# Patient Record
Sex: Male | Born: 1950 | Race: Black or African American | Hispanic: No | Marital: Single | Smoking: Current every day smoker
Health system: Southern US, Community
[De-identification: ages and names within clinical notes are randomized; demographics above are authoritative.]

## PROBLEM LIST (undated history)

## (undated) DIAGNOSIS — J449 Chronic obstructive pulmonary disease, unspecified: Secondary | ICD-10-CM

## (undated) DIAGNOSIS — F329 Major depressive disorder, single episode, unspecified: Secondary | ICD-10-CM

## (undated) DIAGNOSIS — F1490 Cocaine use, unspecified, uncomplicated: Secondary | ICD-10-CM

## (undated) DIAGNOSIS — M069 Rheumatoid arthritis, unspecified: Secondary | ICD-10-CM

## (undated) DIAGNOSIS — E119 Type 2 diabetes mellitus without complications: Secondary | ICD-10-CM

## (undated) DIAGNOSIS — F1910 Other psychoactive substance abuse, uncomplicated: Secondary | ICD-10-CM

## (undated) DIAGNOSIS — I10 Essential (primary) hypertension: Secondary | ICD-10-CM

## (undated) DIAGNOSIS — Z720 Tobacco use: Secondary | ICD-10-CM

## (undated) DIAGNOSIS — M359 Systemic involvement of connective tissue, unspecified: Secondary | ICD-10-CM

## (undated) DIAGNOSIS — M0520 Rheumatoid vasculitis with rheumatoid arthritis of unspecified site: Secondary | ICD-10-CM

## (undated) DIAGNOSIS — F1290 Cannabis use, unspecified, uncomplicated: Secondary | ICD-10-CM

## (undated) DIAGNOSIS — F1010 Alcohol abuse, uncomplicated: Secondary | ICD-10-CM

## (undated) DIAGNOSIS — I509 Heart failure, unspecified: Secondary | ICD-10-CM

## (undated) DIAGNOSIS — F32A Depression, unspecified: Secondary | ICD-10-CM

## (undated) HISTORY — DX: Other psychoactive substance abuse, uncomplicated: F19.10

## (undated) HISTORY — DX: Rheumatoid vasculitis with rheumatoid arthritis of unspecified site: M05.20

## (undated) HISTORY — PX: BACK SURGERY: SHX140

## (undated) HISTORY — DX: Alcohol abuse, uncomplicated: F10.10

## (undated) HISTORY — DX: Major depressive disorder, single episode, unspecified: F32.9

## (undated) HISTORY — DX: Chronic obstructive pulmonary disease, unspecified: J44.9

## (undated) HISTORY — DX: Depression, unspecified: F32.A

## (undated) HISTORY — DX: Rheumatoid arthritis, unspecified: M06.9

## (undated) HISTORY — DX: Cannabis use, unspecified, uncomplicated: F12.90

## (undated) HISTORY — DX: Cocaine use, unspecified, uncomplicated: F14.90

## (undated) HISTORY — DX: Essential (primary) hypertension: I10

## (undated) HISTORY — DX: Heart failure, unspecified: I50.9

## (undated) HISTORY — DX: Tobacco use: Z72.0

---

## 1985-03-07 HISTORY — PX: OTHER SURGICAL HISTORY: SHX169

## 1987-03-08 DIAGNOSIS — M0520 Rheumatoid vasculitis with rheumatoid arthritis of unspecified site: Secondary | ICD-10-CM

## 1987-03-08 HISTORY — DX: Rheumatoid vasculitis with rheumatoid arthritis of unspecified site: M05.20

## 2009-03-05 DIAGNOSIS — F1420 Cocaine dependence, uncomplicated: Secondary | ICD-10-CM | POA: Insufficient documentation

## 2009-03-05 DIAGNOSIS — F1911 Other psychoactive substance abuse, in remission: Secondary | ICD-10-CM | POA: Insufficient documentation

## 2009-03-05 DIAGNOSIS — R509 Fever, unspecified: Secondary | ICD-10-CM | POA: Insufficient documentation

## 2009-03-05 DIAGNOSIS — R062 Wheezing: Secondary | ICD-10-CM | POA: Insufficient documentation

## 2009-03-05 DIAGNOSIS — F1010 Alcohol abuse, uncomplicated: Secondary | ICD-10-CM | POA: Diagnosis present

## 2009-03-05 DIAGNOSIS — R0602 Shortness of breath: Secondary | ICD-10-CM | POA: Insufficient documentation

## 2014-06-22 DIAGNOSIS — J9602 Acute respiratory failure with hypercapnia: Secondary | ICD-10-CM | POA: Insufficient documentation

## 2014-06-22 DIAGNOSIS — Z720 Tobacco use: Secondary | ICD-10-CM | POA: Insufficient documentation

## 2014-06-22 DIAGNOSIS — I509 Heart failure, unspecified: Secondary | ICD-10-CM | POA: Insufficient documentation

## 2014-06-22 DIAGNOSIS — M069 Rheumatoid arthritis, unspecified: Secondary | ICD-10-CM | POA: Insufficient documentation

## 2014-06-22 DIAGNOSIS — J449 Chronic obstructive pulmonary disease, unspecified: Secondary | ICD-10-CM | POA: Insufficient documentation

## 2014-06-22 DIAGNOSIS — F17200 Nicotine dependence, unspecified, uncomplicated: Secondary | ICD-10-CM | POA: Insufficient documentation

## 2014-06-22 DIAGNOSIS — I10 Essential (primary) hypertension: Secondary | ICD-10-CM | POA: Insufficient documentation

## 2014-06-22 DIAGNOSIS — J441 Chronic obstructive pulmonary disease with (acute) exacerbation: Secondary | ICD-10-CM | POA: Insufficient documentation

## 2014-06-22 DIAGNOSIS — I5030 Unspecified diastolic (congestive) heart failure: Secondary | ICD-10-CM | POA: Insufficient documentation

## 2014-11-18 ENCOUNTER — Ambulatory Visit: Payer: Self-pay | Admitting: Family Medicine

## 2014-11-26 ENCOUNTER — Encounter: Payer: Self-pay | Admitting: Family Medicine

## 2014-11-26 ENCOUNTER — Ambulatory Visit (INDEPENDENT_AMBULATORY_CARE_PROVIDER_SITE_OTHER): Payer: Medicare PPO | Admitting: Family Medicine

## 2014-11-26 VITALS — BP 146/76 | HR 80 | Ht 70.0 in | Wt 220.6 lb

## 2014-11-26 DIAGNOSIS — M069 Rheumatoid arthritis, unspecified: Secondary | ICD-10-CM | POA: Diagnosis not present

## 2014-11-26 DIAGNOSIS — E669 Obesity, unspecified: Secondary | ICD-10-CM

## 2014-11-26 DIAGNOSIS — J449 Chronic obstructive pulmonary disease, unspecified: Secondary | ICD-10-CM

## 2014-11-26 DIAGNOSIS — I5032 Chronic diastolic (congestive) heart failure: Secondary | ICD-10-CM

## 2014-11-26 DIAGNOSIS — Z23 Encounter for immunization: Secondary | ICD-10-CM

## 2014-11-26 DIAGNOSIS — I10 Essential (primary) hypertension: Secondary | ICD-10-CM

## 2014-11-26 MED ORDER — ALBUTEROL SULFATE (2.5 MG/3ML) 0.083% IN NEBU: 2.5000 mg | INHALATION_SOLUTION | RESPIRATORY_TRACT | Status: DC | PRN

## 2014-11-26 MED ORDER — HYDROCHLOROTHIAZIDE 25 MG PO TABS: 25.0000 mg | ORAL_TABLET | Freq: Every day | ORAL | Status: DC

## 2014-11-26 NOTE — Progress Notes (Signed)
Date:  11/26/2014   Name:  Daniel Frye   DOB:  14-Jan-1951   MRN:  [id number]  PCP:  [name], MD    Chief Complaint: Establish Care and Edema   History of Present Illness:  This is a 64 y.o. male with CHF/COPD/HTN/RA moved here from [place] 6 months ago, has been hospitalized twice since at  [facility], most recently in May for COPD exacerbation requiring Bipap and steroids. Echo done previous admission showed diastolic dysfunction but not discharged on diuretic, pt states lasix ineffective. Was scheduled to f/u with [facility] for primary care but did not keep appt. Currently c/o increasing fluid retention over past three, feels now affecting breathing. Has run out of nebulizer solution but using albuterol MDI daily. Taking lisinopril but not amlodipine. Has quit smoking, feels breathing worse since finished steroids. Hx pericardial effusion requiring surgery.  Review of Systems:  Review of Systems  Constitutional: Negative for fever and chills.  Cardiovascular: Negative for chest pain.  Gastrointestinal: Negative for abdominal pain.  Genitourinary: Negative for difficulty urinating.  Neurological: Negative for syncope and light-headedness.    Patient Active Problem List   Diagnosis Date Noted  . Obesity (BMI 30.0-34.9) 11/26/2014  . Acute respiratory failure with hypercapnia 06/22/2014  . CCF (congestive cardiac failure) 06/22/2014  . Acute exacerbation of chronic obstructive airways disease 06/22/2014  . Essential (primary) hypertension 06/22/2014  . Arthritis or polyarthritis, rheumatoid 06/22/2014  . Compulsive tobacco user syndrome 06/22/2014  . Mixed, or nondependent drug abuse 03/05/2009    Prior to Admission medications   Medication Sig Start Date End Date Taking? Authorizing Provider  albuterol (PROVENTIL) (2.5 MG/3ML) 0.083% nebulizer solution Take 3 mLs (2.5 mg total) by nebulization every 4 (four) hours as needed for wheezing or shortness of breath. 11/26/14 11/26/15 Yes  [name], MD  hydrochlorothiazide (HYDRODIURIL) 25 MG tablet Take 1 tablet (25 mg total) by mouth daily. 11/26/14   [name], MD  lisinopril (PRINIVIL,ZESTRIL) 40 MG tablet Take 1 tablet by mouth daily at 2 PM daily at 2 PM. 08/20/14   Historical Provider, MD  PROAIR HFA 108 (90 BASE) MCG/ACT inhaler  10/27/14   Historical Provider, MD    No Known Allergies  No past surgical history on file.  Social History  Substance Use Topics  . Smoking status: Former Smoker    Quit date: 05/06/2014  . Smokeless tobacco: Not on file  . Alcohol Use: 0.0 oz/week    0 Standard drinks or equivalent per week    No family history on file.  Medication list has been reviewed and updated.  Physical Examination: BP 146/76 mmHg  Pulse 80  Ht 5\' 10"  (1.778 m)  Wt 220 lb 9.6 oz (100.064 kg)  BMI 31.65 kg/m2  SpO2 97%  Physical Exam  Constitutional: He is oriented to person, place, and time. He appears well-developed and well-nourished.  Cardiovascular: Normal rate, regular rhythm and normal heart sounds.  Exam reveals no gallop.   No murmur heard. Pulmonary/Chest: Effort normal and breath sounds normal. He has no wheezes. He has no rales.  Abdominal: Soft. He exhibits no distension and no mass. There is no tenderness.  Musculoskeletal:  2+ tense edema BLE to thighs, no skin breakdown  Neurological: He is alert and oriented to person, place, and time. Coordination normal.  Skin: Skin is warm and dry.  Psychiatric: He has a normal mood and affect. His behavior is normal.    Assessment and Plan:  1. Chronic diastolic congestive heart failure Significant  progressive BLE edema, begin HCTZ with lisinopril, consider spironolactone - Comprehensive metabolic panel - CBC - TSH  2. Essential (primary) hypertension Marginal control, should improve with HCTZ  3. Chronic obstructive pulmonary disease, unspecified COPD, unspecified chronic bronchitis type Refill albuterol nebs, needs pulm  f/u  4. Obesity (BMI 30.0-34.9) - HgB A1c  5. Arthritis or polyarthritis, rheumatoid By hx, no active therapy  6. Needs flu shot - Flu Vaccine QUAD 36+ mos PF IM (Fluarix & Fluzone Quad PF)  Return in about 1 week (around 12/03/2014). Discuss receiving his care within single system [facility]) for improved continuity.  Satira Anis. [facility]  11/26/2014

## 2014-11-27 LAB — COMPREHENSIVE METABOLIC PANEL
ALT: 22 IU/L (ref 0–44)
AST: 20 IU/L (ref 0–40)
Albumin/Globulin Ratio: 1.5 (ref 1.1–2.5)
Albumin: 4 g/dL (ref 3.6–4.8)
Alkaline Phosphatase: 94 IU/L (ref 39–117)
BUN/Creatinine Ratio: 17 (ref 10–22)
BUN: 17 mg/dL (ref 8–27)
Bilirubin Total: 0.2 mg/dL (ref 0.0–1.2)
CO2: 25 mmol/L (ref 18–29)
Calcium: 9 mg/dL (ref 8.6–10.2)
Chloride: 101 mmol/L (ref 97–108)
Creatinine, Ser: 0.98 mg/dL (ref 0.76–1.27)
GFR calc Af Amer: 92 mL/min/{1.73_m2} (ref >59)
GFR calc non Af Amer: 80 mL/min/{1.73_m2} (ref >59)
Globulin, Total: 2.7 g/dL (ref 1.5–4.5)
Glucose: 91 mg/dL (ref 65–99)
Potassium: 3.9 mmol/L (ref 3.5–5.2)
Sodium: 144 mmol/L (ref 134–144)
Total Protein: 6.7 g/dL (ref 6.0–8.5)

## 2014-11-27 LAB — CBC
Hematocrit: 43.7 % (ref 37.5–51.0)
Hemoglobin: 14.5 g/dL (ref 12.6–17.7)
MCH: 30.4 pg (ref 26.6–33.0)
MCHC: 33.2 g/dL (ref 31.5–35.7)
MCV: 92 fL (ref 79–97)
Platelets: 183 10*3/uL (ref 150–379)
RBC: 4.77 x10E6/uL (ref 4.14–5.80)
RDW: 14.4 % (ref 12.3–15.4)
WBC: 7 10*3/uL (ref 3.4–10.8)

## 2014-11-27 LAB — HEMOGLOBIN A1C
Est. average glucose Bld gHb Est-mCnc: 137 mg/dL
Hgb A1c MFr Bld: 6.4 % — ABNORMAL HIGH (ref 4.8–5.6)

## 2014-11-27 LAB — TSH: TSH: 0.633 u[IU]/mL (ref 0.450–4.500)

## 2014-12-03 ENCOUNTER — Ambulatory Visit (INDEPENDENT_AMBULATORY_CARE_PROVIDER_SITE_OTHER): Payer: Medicare PPO | Admitting: Family Medicine

## 2014-12-03 ENCOUNTER — Encounter: Payer: Self-pay | Admitting: Family Medicine

## 2014-12-03 VITALS — BP 132/60 | HR 104 | Ht 70.0 in | Wt 213.0 lb

## 2014-12-03 DIAGNOSIS — I10 Essential (primary) hypertension: Secondary | ICD-10-CM | POA: Diagnosis not present

## 2014-12-03 DIAGNOSIS — E669 Obesity, unspecified: Secondary | ICD-10-CM

## 2014-12-03 DIAGNOSIS — J449 Chronic obstructive pulmonary disease, unspecified: Secondary | ICD-10-CM | POA: Diagnosis not present

## 2014-12-03 DIAGNOSIS — R7309 Other abnormal glucose: Secondary | ICD-10-CM

## 2014-12-03 DIAGNOSIS — I5032 Chronic diastolic (congestive) heart failure: Secondary | ICD-10-CM | POA: Diagnosis not present

## 2014-12-03 DIAGNOSIS — E119 Type 2 diabetes mellitus without complications: Secondary | ICD-10-CM | POA: Insufficient documentation

## 2014-12-03 DIAGNOSIS — R7303 Prediabetes: Secondary | ICD-10-CM

## 2014-12-03 MED ORDER — ALBUTEROL SULFATE HFA 108 (90 BASE) MCG/ACT IN AERS: 2.0000 | INHALATION_SPRAY | RESPIRATORY_TRACT | Status: DC | PRN

## 2014-12-03 NOTE — Progress Notes (Signed)
Date:  12/03/2014   Name:  Daniel Frye   DOB:  02-Nov-1950   MRN:  [id number]  PCP:  [name], MD    Chief Complaint: No chief complaint on file.   History of Present Illness:  This is a 64 y.o. male seen for first time last week with progressive pedal edema, echo in May showed diastolic dysfunction. Placed on HCTZ in addition to lisinopril, lab work ok except prediabetes. Reports legs much less swollen, weight down 7#. Abdomen feels less distended as well. 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 Told to see PCP in [facility] when discharged from [facility] in May but no specialist appointments given. Needs refill albuterol MDI which he uses 1-2 times daily.  Review of Systems:  Review of Systems  Constitutional: Negative for fever and chills.  Cardiovascular: Negative for chest pain.  Gastrointestinal: Negative for abdominal pain.  Endocrine: Negative for polyuria.  Genitourinary: Negative for difficulty urinating.  Neurological: Negative for weakness and light-headedness.    Patient Active Problem List   Diagnosis Date Noted  . Obesity (BMI 30.0-34.9) 11/26/2014  . Diastolic congestive heart failure 06/22/2014  . COPD (chronic obstructive pulmonary disease) 06/22/2014  . Essential (primary) hypertension 06/22/2014  . Arthritis or polyarthritis, rheumatoid 06/22/2014  . Mixed, or nondependent drug abuse 03/05/2009    Prior to Admission medications   Medication Sig Start Date End Date Taking? Authorizing Provider  albuterol (PROVENTIL) (2.5 MG/3ML) 0.083% nebulizer solution Take 3 mLs (2.5 mg total) by nebulization every 4 (four) hours as needed for wheezing or shortness of breath. 11/26/14 11/26/15 Yes [name], MD  hydrochlorothiazide (HYDRODIURIL) 25 MG tablet Take 1 tablet (25 mg total) by mouth daily. 11/26/14  Yes [name], MD  lisinopril (PRINIVIL,ZESTRIL) 40 MG tablet Take 1 tablet by mouth daily at 2 PM daily at 2 PM. 08/20/14   Yes Historical Provider, MD  albuterol (VENTOLIN HFA) 108 (90 BASE) MCG/ACT inhaler Inhale 2 puffs into the lungs every 4 (four) hours as needed for wheezing or shortness of breath. 12/03/14   [name], MD    No Known Allergies  No past surgical history on file.  Social History  Substance Use Topics  . Smoking status: Former Smoker    Quit date: 05/06/2014  . Smokeless tobacco: None  . Alcohol Use: 0.0 oz/week    0 Standard drinks or equivalent per week    No family history on file.  Medication list has been reviewed and updated.  Physical Examination: BP 132/60 mmHg  Pulse 104  Ht 5\' 10"  (1.778 m)  Wt 213 lb (96.616 kg)  BMI 30.56 kg/m2  Physical Exam  Constitutional: He appears well-developed and well-nourished. No distress.  Cardiovascular: Normal rate, regular rhythm and normal heart sounds.   Pulmonary/Chest: Effort normal and breath sounds normal.  Abdominal: Soft. He exhibits no distension and no mass. There is no tenderness.  Musculoskeletal:  Trace BLE edema  Neurological: He is alert.  Skin: Skin is warm and dry.  Psychiatric: He has a normal mood and affect. His behavior is normal.  Nursing note and vitals reviewed.   Assessment and Plan:  1. Chronic diastolic congestive heart failure Good diuresis with HCTZ, continue current dose, consider cardiology referral next visit  2. Essential (primary) hypertension Well controlled now on lisinopril/HCTZ  3. 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 Obesity (BMI 30.0-34.9) Weight loss mostly fluid, discussed NCS/NAS diet  5. Prediabetes NCS diet, consider MNT referral next visit  Return in about 4 weeks (around 12/31/2014). Pt wishes to continue primary care here.  Satira Anis. [facility]  12/03/2014

## 2015-01-02 ENCOUNTER — Ambulatory Visit: Payer: Self-pay | Admitting: Family Medicine

## 2015-01-13 ENCOUNTER — Ambulatory Visit: Payer: Self-pay | Admitting: Family Medicine

## 2015-01-26 ENCOUNTER — Ambulatory Visit (INDEPENDENT_AMBULATORY_CARE_PROVIDER_SITE_OTHER): Payer: Medicare PPO | Admitting: Family Medicine

## 2015-01-26 ENCOUNTER — Encounter: Payer: Self-pay | Admitting: Family Medicine

## 2015-01-26 VITALS — BP 148/84 | HR 84 | Ht 70.0 in | Wt 214.6 lb

## 2015-01-26 DIAGNOSIS — J449 Chronic obstructive pulmonary disease, unspecified: Secondary | ICD-10-CM

## 2015-01-26 DIAGNOSIS — I10 Essential (primary) hypertension: Secondary | ICD-10-CM

## 2015-01-26 DIAGNOSIS — R7303 Prediabetes: Secondary | ICD-10-CM

## 2015-01-26 DIAGNOSIS — I5032 Chronic diastolic (congestive) heart failure: Secondary | ICD-10-CM

## 2015-01-26 DIAGNOSIS — E669 Obesity, unspecified: Secondary | ICD-10-CM

## 2015-01-26 DIAGNOSIS — E559 Vitamin D deficiency, unspecified: Secondary | ICD-10-CM

## 2015-01-26 DIAGNOSIS — E119 Type 2 diabetes mellitus without complications: Secondary | ICD-10-CM | POA: Diagnosis not present

## 2015-01-26 NOTE — Progress Notes (Signed)
Date:  01/26/2015   Name:  Daniel Frye   DOB:  1950/04/12   MRN:  [id number]  PCP:  [name], MD    Chief Complaint: Hypertension and COPD   History of Present Illness:  This is a 64 y.o. male with chronic diastolic CHF improved now on lisinopril/HCTZ, no further fluid retention, feels well. Breathing better, using albuterol MDI prn only. Trying to adhere to NCS/NAS diet. 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  Review of Systems:  Review of Systems  Constitutional: Negative for fever and unexpected weight change.  Cardiovascular: Negative for chest pain and leg swelling.  Gastrointestinal: Negative for abdominal pain.  Endocrine: Negative for polyuria.  Genitourinary: Negative for difficulty urinating.  Neurological: Negative for syncope and light-headedness.    Patient Active Problem List   Diagnosis Date Noted  . Prediabetes 12/03/2014  . Obesity (BMI 30.0-34.9) 11/26/2014  . Diastolic congestive heart failure ([facility]) 06/22/2014  . COPD (chronic obstructive pulmonary disease) ([place]) 06/22/2014  . Essential (primary) hypertension 06/22/2014  . Arthritis or polyarthritis, rheumatoid ([place]) 06/22/2014  . Mixed, or nondependent drug abuse 03/05/2009    Prior to Admission medications   Medication Sig Start Date End Date Taking? Authorizing Provider  albuterol (PROVENTIL) (2.5 MG/3ML) 0.083% nebulizer solution Take 3 mLs (2.5 mg total) by nebulization every 4 (four) hours as needed for wheezing or shortness of breath. 11/26/14 11/26/15 Yes [name], MD  albuterol (VENTOLIN HFA) 108 (90 BASE) MCG/ACT inhaler Inhale 2 puffs into the lungs every 4 (four) hours as needed for wheezing or shortness of breath. 12/03/14  Yes [name], MD  hydrochlorothiazide (HYDRODIURIL) 25 MG tablet Take 1 tablet (25 mg total) by mouth daily. 11/26/14  Yes [name], MD  lisinopril (PRINIVIL,ZESTRIL) 40 MG tablet Take 1 tablet by mouth daily at 2 PM daily at 2 PM.  08/20/14  Yes Historical Provider, MD    No Known Allergies  No past surgical history on file.  Social History  Substance Use Topics  . Smoking status: Former Smoker    Quit date: 05/06/2014  . Smokeless tobacco: None  . Alcohol Use: 0.0 oz/week    0 Standard drinks or equivalent per week    No family history on file.  Medication list has been reviewed and updated.  Physical Examination: BP 148/84 mmHg  Pulse 84  Ht 5\' 10"  (1.778 m)  Wt 214 lb 9.6 oz (97.342 kg)  BMI 30.79 kg/m2  Physical Exam  Constitutional: He appears well-developed and well-nourished.  Cardiovascular: Normal rate, regular rhythm and normal heart sounds.   Pulmonary/Chest: Effort normal and breath sounds normal.  Musculoskeletal: He exhibits no edema.  Neurological: He is alert.  Skin: Skin is warm and dry.  Psychiatric: He has a normal mood and affect. His behavior is normal.  Nursing note and vitals reviewed.   Assessment and Plan:  1. Essential (primary) hypertension Marginal control today, will ask cardiology for further management recommendations (BB vs. CCB) - Basic Metabolic Panel (BMET)  2. Chronic diastolic congestive heart failure ([place]) Well compensated - Ambulatory referral to Cardiology  3. Prediabetes On NCS diet, recheck a1c - HgB A1c  4. Chronic obstructive pulmonary disease, unspecified COPD type (HCC) Improved, using MDI prn only, trigger avoidance discussed  5. Obesity (BMI 30.0-34.9) Weight loss discussed - Vitamin D (25 hydroxy) - B12  Return in about 3 months (around 04/28/2015).  Satira Anis. Kay[facility] Holck, [facility]  01/26/2015

## 2015-01-27 DIAGNOSIS — E559 Vitamin D deficiency, unspecified: Secondary | ICD-10-CM | POA: Insufficient documentation

## 2015-01-27 LAB — HEMOGLOBIN A1C
Est. average glucose Bld gHb Est-mCnc: 146 mg/dL
Hgb A1c MFr Bld: 6.7 % — ABNORMAL HIGH (ref 4.8–5.6)

## 2015-01-27 LAB — BASIC METABOLIC PANEL
BUN/Creatinine Ratio: 18 (ref 10–22)
BUN: 20 mg/dL (ref 8–27)
CO2: 25 mmol/L (ref 18–29)
Calcium: 9.4 mg/dL (ref 8.6–10.2)
Chloride: 97 mmol/L (ref 97–106)
Creatinine, Ser: 1.09 mg/dL (ref 0.76–1.27)
GFR calc Af Amer: 81 mL/min/{1.73_m2} (ref >59)
GFR calc non Af Amer: 70 mL/min/{1.73_m2} (ref >59)
Glucose: 101 mg/dL — ABNORMAL HIGH (ref 65–99)
Potassium: 4 mmol/L (ref 3.5–5.2)
Sodium: 140 mmol/L (ref 136–144)

## 2015-01-27 LAB — VITAMIN B12: Vitamin B-12: 419 pg/mL (ref 211–946)

## 2015-01-27 LAB — VITAMIN D 25 HYDROXY (VIT D DEFICIENCY, FRACTURES): Vit D, 25-Hydroxy: 11.1 ng/mL — ABNORMAL LOW (ref 30.0–100.0)

## 2015-01-27 MED ORDER — VITAMIN D 50 MCG (2000 UT) PO CAPS: 1.0000 | ORAL_CAPSULE | Freq: Every day | ORAL | Status: DC

## 2015-01-27 MED ORDER — METFORMIN HCL 500 MG PO TABS: 500.0000 mg | ORAL_TABLET | Freq: Two times a day (BID) | ORAL | Status: DC

## 2015-01-27 NOTE — Addendum Note (Signed)
Addended by: [name] on: 01/27/2015 10:47 AM   Modules accepted: Orders

## 2015-03-04 ENCOUNTER — Other Ambulatory Visit: Payer: Self-pay | Admitting: *Deleted

## 2015-03-05 ENCOUNTER — Encounter: Payer: Self-pay | Admitting: Family Medicine

## 2015-03-05 DIAGNOSIS — Z79899 Other long term (current) drug therapy: Secondary | ICD-10-CM | POA: Insufficient documentation

## 2015-03-10 ENCOUNTER — Other Ambulatory Visit: Payer: Self-pay | Admitting: *Deleted

## 2015-03-10 DIAGNOSIS — F1010 Alcohol abuse, uncomplicated: Secondary | ICD-10-CM

## 2015-03-10 DIAGNOSIS — E0800 Diabetes mellitus due to underlying condition with hyperosmolarity without nonketotic hyperglycemic-hyperosmolar coma (NKHHC): Secondary | ICD-10-CM

## 2015-03-10 DIAGNOSIS — I5021 Acute systolic (congestive) heart failure: Secondary | ICD-10-CM

## 2015-03-10 DIAGNOSIS — I10 Essential (primary) hypertension: Secondary | ICD-10-CM

## 2015-03-10 NOTE — Patient Outreach (Signed)
[facility]) Care Management  03/10/2015  Jordani Salm 27-Feb-1951 [id number]   RN Health Coach attempted  High ED Utilized 1st screening outreach call to patient.  Patient was unavailable. HIPPA compliance voicemail message was left with return callback number.    [place] Care Management [phone]

## 2015-03-10 NOTE — Patient Outreach (Signed)
[place]) Care Management  03/10/2015  Daniel Frye 1950-09-07 [id number]  Returned call to patient for screening of ED high Utilization . RN Health Coach explained to patient about [place] and the role of the Nurse, Pharmacy and Education officer, museum.. Per patient he can walk about a mile and gets real short of breath. Patient stated he weighed 3 days ago and weighed 210 . On yesterday he weighed 213. Today he weighed 211. This patient has heard of zones but does not know what it means. Explained to patient about letting his Dr know about weight gain of 3 lbs in one day and 5 in a week. RN Health Coach explained to patient that he should weigh the same time in the morning each day. Patient was talking about the concerns of his abdomen getting larger. Per patient he is drinking a pint a day. Patient stated he stays in a building in the back of his sister house. 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 Patient is having problems sleeping at night.  Patient is very agreeable to an Secondary school teacher coming to visit. Please call on the [phone].   Assessment Knowledge deficit in self management of Congestive Heart Failure Alcohol Abuse  Plan Referral to Community Referral to Social Worker  [place] Management [phone]

## 2015-03-12 ENCOUNTER — Other Ambulatory Visit: Payer: Self-pay | Admitting: *Deleted

## 2015-03-12 NOTE — Patient Outreach (Signed)
RNCM collaborated with [place] to make a joint appointment to see pt. SW confirmed she was planning to call pt and arrange appt for both of [place] to visit pt. SW confirmed appt scheduled for next week.   RNCM also spoke with [place] who referred pt to [facility] about pt's barriers.   Plan: RNCM to visit pt next week 03/19/15 @ 9:30 with [facility] SW  Aneshia Jacquet RN, BSN  [facility] Care Management [phone])

## 2015-03-12 NOTE — Patient Outreach (Signed)
[facility]) Care Management  03/12/2015  Jofiel Dentino 1950-10-14 [id number]   Phone call to patient to schedule initial home visit.  This Education officer, museum and [place] Minor to complete co-visit on 03/19/15 at 9:30am.    [place], [place] Management [phone]

## 2015-03-19 ENCOUNTER — Other Ambulatory Visit: Payer: Self-pay | Admitting: *Deleted

## 2015-03-19 ENCOUNTER — Encounter: Payer: Self-pay | Admitting: *Deleted

## 2015-03-19 VITALS — BP 145/85 | HR 96 | Resp 22 | Ht 70.0 in | Wt 208.0 lb

## 2015-03-19 DIAGNOSIS — Z79899 Other long term (current) drug therapy: Secondary | ICD-10-CM

## 2015-03-19 NOTE — Patient Outreach (Signed)
[facility]) Care Management   03/19/2015  Daniel Frye Jun 26, 1950 [id number]  Daniel Frye is an 65 y.o. male  Subjective: "I fall out of bed and don't even realize I have fallen out of bed." "I am about to run out of my nebulizer medications, and I am out of my inhaler." "I didn't know I was supposed to be taking any other medication besides this fluid pill." "I have been really short of breath lately and I am sleeping on 3 pillows."   Objective:Blood pressure 145/85, pulse 96, resp. rate 22, height 1.778 m ('5\' 10"' ), weight 208 lb (94.348 kg), SpO2 95 %.   Review of Systems  HENT: Positive for congestion and hearing loss.   Respiratory: Positive for cough and shortness of breath. Negative for sputum production.   Cardiovascular: Positive for orthopnea. Negative for chest pain and leg swelling.  Musculoskeletal: Positive for falls.  Psychiatric/Behavioral: Positive for substance abuse.    Physical Exam  Constitutional: He is oriented to person, place, and time. He appears well-developed and well-nourished.  Cardiovascular: Normal heart sounds.   Respiratory: Effort normal.  Coarse breath sounds noted. Pt reports non-productive cough.   GI: Soft. Bowel sounds are normal.  Pt reporting abd distension/bloating.  Musculoskeletal: Normal range of motion.  Neurological: He is alert and oriented to person, place, and time.  Skin: Skin is warm and dry.  Psychiatric: He has a normal mood and affect. His speech is normal and behavior is normal. Thought content normal. He exhibits abnormal recent memory.  Pt reports he drinks 4-5 pints of liquor per week, but states "He can stop any time he wants to."  Pt had run out of several of his medications and stated he "Just forgot to get it refilled."    Current Medications:   Current Outpatient Prescriptions  Medication Sig Dispense Refill  . albuterol (PROVENTIL) (2.5 MG/3ML) 0.083% nebulizer solution Take 3 mLs (2.5 mg total)  by nebulization every 4 (four) hours as needed for wheezing or shortness of breath. 75 mL 3  . hydrochlorothiazide (HYDRODIURIL) 25 MG tablet Take 1 tablet (25 mg total) by mouth daily. 30 tablet 2  . Lactase (DAIRY DIGESTIVE SUPPLEMENT PO) Take 1 tablet by mouth 2 (two) times daily. Digestive enzyme blend    . albuterol (VENTOLIN HFA) 108 (90 BASE) MCG/ACT inhaler Inhale 2 puffs into the lungs every 4 (four) hours as needed for wheezing or shortness of breath. (Patient not taking: Reported on 03/19/2015) 18 g 3  . Cholecalciferol (VITAMIN D) 2000 UNITS CAPS Take 1 capsule (2,000 Units total) by mouth daily. (Patient not taking: Reported on 03/19/2015) 30 capsule   . lisinopril (PRINIVIL,ZESTRIL) 40 MG tablet Take 1 tablet by mouth daily at 2 PM. Reported on 03/19/2015    . metFORMIN (GLUCOPHAGE) 500 MG tablet Take 1 tablet (500 mg total) by mouth 2 (two) times daily with a meal. (Patient not taking: Reported on 03/19/2015) 60 tablet 2   No current facility-administered medications for this visit.    Functional Status:   In your present state of health, do you have any difficulty performing the following activities: 03/19/2015 03/19/2015  Hearing? Daniel Frye  Vision? Y N  Difficulty concentrating or making decisions? Daniel Frye  Walking or climbing stairs? Y Y  Dressing or bathing? N N  Doing errands, shopping? Daniel Frye  Preparing Food and eating ? N -  Using the Toilet? N -  In the past six months, have you accidently leaked urine? Daniel Frye  Y  Do you have problems with loss of bowel control? N -  Managing your Medications? Y -  Managing your Finances? Y -  Housekeeping or managing your Housekeeping? Y -    Fall/Depression Screening:    PHQ 2/9 Scores 03/10/2015 11/26/2014  PHQ - 2 Score 2 0  PHQ- 9 Score 3 -   Fall Risk  03/19/2015 11/26/2014  Falls in the past year? Yes No  Number falls in past yr: 2 or more -  Injury with Fall? No -  Risk for fall due to : History of fall(s);Impaired balance/gait;Impaired vision  -    Assessment: RNCM's first visit with pleasant pt. Co-visit with [facility] SW Daniel Frye. Pt noted to be very open about his history of drug abuse and current habit of alcohol use. Pt's living situation not fully explored related to time spent on disease education and medication adherence counseling. Pt reports living in a heated building in the back of the residence of his sister. RNCM and SW met pt in his sisters home which was noted to be a clean environment. RNCM suspects low health literacy level related to pt's admission of quitting school and pt inability to read prescription bottles easily.   Medications: Pt only had hydrochlorothiazide and 2 albuterol nebulizer bullets present. He had completed the lisinopril in the medication bottle and never had it refilled. Pt had never picked up the metformin ordered in November according to pt's pharmacy. RNCM educated pt on the purpose and importance of medications and assisted pt in calling in all refills. Pt states he will be able to obtain transportation to the pharmacy. RNCM also discussed [employer] mail order related to pt forgetting to obtain medications. Pt stated he was interested in this plan. Medication refills called in: Lisinopril, Metformin, Nebulizer solution, Albuterol inhaler and Vit D.   COPD: Lungs clear, pt denies sputum production, noted SOB with exertion. 02 sat 95% resting on room air.   HF: Lungs clear, pt reports weighing daily, but not recording. No edema noted to lower legs/ankles or hands. Pt reporting abd feels bloated. Pt deines loss of appetite, and reports eating a high sodium diet. RNCM educated pt using Heart Failure packet about heart failure zones, and low sodium diet. Education pt needs review. Noted in pt chart referal to cardiology made by primary care but pt has not followed up. RNCM will f/u with pt related to this. RNCM gave pt [facility] calendar log book and showed pt where to record weights, discussed 3 lb wt gain in one day  and 5 lb weight gain in one week.   RNCM educated pt on the effects of alcohol on his heart, related to heart failure and hypertension. RNCM educated pt on the effects of alcohol to his balance and pt's history of falls. SW talked with pt about needing further assistance with stopping drinking. Both SW and RNCM will f/u.    Plan: RNCM will f/u with cardiology office regarding pt's referral.  RNCM will f/u with pt related to medications and cardiology appt.  RNCM will see pt on 2/3 to assess progress with goals.     [facility] CM Care Plan Problem One        Most Recent Value   Care Plan Problem One  Pt reporting he wants to be able to breathe better and not feel so short of breath when he walks.   Role Documenting the Problem One  Care Management [place] for Problem One  Active   [facility] Long Term Goal (31-90 days)  Pt will be able to ambulate 1 block with out significant SOB in the next 90 days.    [facility] Long Term Goal Start Date  03/19/15   Interventions for Problem One Long Term Goal  RNCM open case to care management, RNCM educated pt on HF and med adherence   [facility] CM Short Term Goal #1 (0-30 days)  Pt will pick up prescriptions and take as directed for the next 30 days as evidenced by correct medication count.    [facility] CM Short Term Goal #1 Start Date  03/19/15   Interventions for Short Term Goal #1  RNCM did med review with pt. RNCM called in missing medications to the pt's pharmacy. RNCM ordered pharmacy refferal to assist with [facility] Mail order. RNCM educated pt on purpose and doses of pt's medications.    [facility] CM Short Term Goal #2 (0-30 days)  Pt will have scheduled f/u with referred cardiologist in the next 30 days.    [facility] CM Short Term Goal #2 Start Date  03/19/15   Interventions for Short Term Goal #2  RNCM reviewed pt chart and noted pt did not follow through with cardiology as recommended. RNCM plans to assist pt with obtaining appointment and ensuring transportation.        Rutherford Limerick RN, BSN  [facility] Care Management [phone])

## 2015-03-19 NOTE — Patient Outreach (Signed)
[facility]) Care Management  [facility] Social Work  03/19/2015  Daniel Frye January 04, 1951 [id number]  Subjective:  Patient is a 65 year old male,  Currently lives with his sister, in her back house. Patient has electricity, however no bathroom facility, uses sister's home to shower and use the bathroom.  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  Patient admits to polysubstance abuse history, now drinks approximately 4 pints of vodka a week and 2 beers.  Per patient, he uses the alcohol to relax and help him sleep at night.  Objective:   Current Medications:  Current Outpatient Prescriptions  Medication Sig Dispense Refill  . albuterol (PROVENTIL) (2.5 MG/3ML) 0.083% nebulizer solution Take 3 mLs (2.5 mg total) by nebulization every 4 (four) hours as needed for wheezing or shortness of breath. 75 mL 3  . albuterol (VENTOLIN HFA) 108 (90 BASE) MCG/ACT inhaler Inhale 2 puffs into the lungs every 4 (four) hours as needed for wheezing or shortness of breath. (Patient not taking: Reported on 03/19/2015) 18 g 3  . Cholecalciferol (VITAMIN D) 2000 UNITS CAPS Take 1 capsule (2,000 Units total) by mouth daily. (Patient not taking: Reported on 03/19/2015) 30 capsule   . hydrochlorothiazide (HYDRODIURIL) 25 MG tablet Take 1 tablet (25 mg total) by mouth daily. 30 tablet 2  . Lactase (DAIRY DIGESTIVE SUPPLEMENT PO) Take 1 tablet by mouth 2 (two) times daily. Digestive enzyme blend    . lisinopril (PRINIVIL,ZESTRIL) 40 MG tablet Take 1 tablet by mouth daily at 2 PM. Reported on 03/19/2015    . metFORMIN (GLUCOPHAGE) 500 MG tablet Take 1 tablet (500 mg total) by mouth 2 (two) times daily with a meal. (Patient not taking: Reported on 03/19/2015) 60 tablet 2   No current facility-administered medications for this visit.    Functional Status:  In your present state of health, do you have any difficulty performing the following activities:  03/19/2015 11/26/2014  Hearing? [name]  Vision? N Y  Difficulty concentrating or making decisions? [name]  Walking or climbing stairs? Y Y  Dressing or bathing? N Y  Doing errands, shopping? Y Y  In the past six months, have you accidently leaked urine? Y -    Fall/Depression Screening:  PHQ 2/9 Scores 03/10/2015 11/26/2014  PHQ - 2 Score 2 0  PHQ- 9 Score 3 -    Assessment: Co-visit with RNCM janci Minor. Patient discussed having a long history of polysubstance abuse, starting  approximately at the age of 31 .  Per patient, he stopped drug use about approximately 1 year ago, due to the effect the drugs had on his breathing.  Patient states that he still drinks vodka, approximately 4 pints per week along with beer to help him sleep at night. Per patient, he feels that his alcohol use is in control and does not feel he needs treatment.  However he did admit to frequent falls being possibly related to alcohol use.  RNCM discussed the adverse affects of alcohol use, this Education officer, museum assessed need for treatment.  Per patient, he has been in treatment in the past, however feels that he will be able to stop the drinking on his own. Per patient, he has decreased his alcohol use significantly.  This Education officer, museum provided patient with a copy of an advanced directive for his review.  Patient also requesting assistance with a hearing aid.  This Education officer, museum discussed  the program through the [employer] of [employer] Department of the Deaf and Hard of Hearing and need for him to gave a recent hearing test. Audiologist list provided.   Plan:  Patient to review Advanced Directive for next visit on 04/20/15            Patient to request that a referral be sent to [facility] for hearing test            EMMI assigned on alcohol use and heart failure            This social worker will continue to explore treatment options related to patient's drug use.    [facility] CM Care Plan Problem One        Most Recent  Value   Care Plan Problem One  patient needs a hearing aid   Role Documenting the Problem One  Clinical Social Worker   Care Plan for Problem One  Active   THN CM Short Term Goal #1 (0-30 days)  patient will contact his doctor to request a referral for a ENT specialist   THN CM Short Term Goal #1 Start Date  03/19/15   Interventions for Short Term Goal #1  This social worker provided patient with local ENT specialist and requested that he call his doctor for a referral         Sheralyn Boatman [facility] Care Management [phone]

## 2015-03-22 NOTE — Addendum Note (Signed)
Addended by: Gurney Maxin on: 03/22/2015 09:45 PM   Modules accepted: Orders

## 2015-03-23 ENCOUNTER — Encounter: Payer: Self-pay | Admitting: Family Medicine

## 2015-03-23 DIAGNOSIS — Z9119 Patient's noncompliance with other medical treatment and regimen: Secondary | ICD-10-CM | POA: Insufficient documentation

## 2015-03-23 DIAGNOSIS — Z91199 Patient's noncompliance with other medical treatment and regimen due to unspecified reason: Secondary | ICD-10-CM | POA: Insufficient documentation

## 2015-03-23 DIAGNOSIS — F1010 Alcohol abuse, uncomplicated: Secondary | ICD-10-CM | POA: Insufficient documentation

## 2015-03-27 ENCOUNTER — Other Ambulatory Visit: Payer: Self-pay | Admitting: *Deleted

## 2015-03-27 NOTE — Patient Outreach (Signed)
RNCM called cardiologist office of Dr. Ubaldo Glassing to inquire about pt's referral placed 01/26/15. Contact person Elberta Fortis looked up pt information and stated she could not see the referral. Appointment made with Dr. Ubaldo Glassing on Monday at 2:30.  RNCM called pt to make him aware of appt and time and confirm he had transportation. Pt was able to state appointment placed and time back to [facility] and confirmed he would be able to get transportation.Pt stated he had picked up all medications and was weighing daily. Pt stating he was still bloated in in abdomen but was improved since taking medications.   RNCM reached out to Dr. Chinita Greenland office to request the prior authorization needed for [facility]. Spoke with receptionist who stated she would send information to Dr. Chinita Greenland nurse today.    Plan: Will f/u with pt after cardiology appt.   Rutherford Limerick RN, BSN  [facility] Care Management [phone])

## 2015-03-30 ENCOUNTER — Other Ambulatory Visit: Payer: Self-pay | Admitting: *Deleted

## 2015-03-30 ENCOUNTER — Other Ambulatory Visit: Payer: Self-pay | Admitting: Pharmacist

## 2015-03-30 DIAGNOSIS — R600 Localized edema: Secondary | ICD-10-CM | POA: Insufficient documentation

## 2015-03-30 DIAGNOSIS — G4733 Obstructive sleep apnea (adult) (pediatric): Secondary | ICD-10-CM | POA: Insufficient documentation

## 2015-03-30 DIAGNOSIS — I5032 Chronic diastolic (congestive) heart failure: Secondary | ICD-10-CM | POA: Insufficient documentation

## 2015-03-30 DIAGNOSIS — R0602 Shortness of breath: Secondary | ICD-10-CM | POA: Insufficient documentation

## 2015-03-30 DIAGNOSIS — G4730 Sleep apnea, unspecified: Secondary | ICD-10-CM | POA: Insufficient documentation

## 2015-03-30 NOTE — Patient Outreach (Signed)
Daniel Frye is a 65 y.o. male referred to pharmacy for medication education and adherence. Call and speak with Daniel Frye who reports that he is getting ready to head to his Cardiology appointment, which is at 2:30 pm. As patient is getting ready to go, let him know that I will call him back on 04/01/15. Patient states that he appreciates the call.  Harlow Asa, PharmD Clinical Pharmacist [place] [facility] [phone]

## 2015-03-30 NOTE — Patient Outreach (Signed)
RNCM called pt to remind him of cardiology appointment for today. Pt stated he had remembered the appointment but couldn't remember the Dr's name. RNCM gave him the doctor's name and the clinic name again. Pt reports having transportation, stating his sister was going to take him. Pt stated his weight was up from 209lbs yesterday to 214 this am. RNCM made pt aware he needed to tell the cardiologist this. Pt stated his abdomen felt bloated and he was SOB. Pt stated he was going to let the cardiologist know. RNCM reminded pt to take all of his medications with him so the doctor would know exactly what he is taking. Pt did not feel like his SOB was bad enough to call the EMS, pt was speaking in complete sentences. RNCM asked pt to let her know what the doctor says.  Plan: RNCM will call pt back this week to see what cardiologist ordered and assist pt in clarifying any new orders.  Rutherford Limerick RN, BSN  [facility] Care Management [phone])

## 2015-04-03 ENCOUNTER — Other Ambulatory Visit: Payer: Self-pay | Admitting: Pharmacist

## 2015-04-03 NOTE — Patient Outreach (Signed)
Daniel Frye is a 65 y.o. male referred to pharmacy for medication education and adherence. Called to speak with Mr. Urben who reports that he is doing well. Reports that he went to his Cardiologist on Monday and that the physician is doing some testing on his heart and a sleep study.  Requested to review the patient's medications with him. Patient goes inside to do this and his sister helps him as we go through each one. Review with patient and his sister each of the medications including name, dose, indication and administration. Note that per [facility] Everywhere note from Dr. Saralyn Pilar, patient was to stop taking the hydrochlorothiazide and to start carvedilol 3.125 mg twice daily and furosemide 20 mg daily. Patient reports that he did not remember to do this. Patient locates his visit summary paper from this appointment with his Cardiologist and finds where it provides these instructions. Have patient discard the hydrochlorothiazide at this time  Hang up with patient briefly to call his pharmacy, [facility]. Speak with the pharmacist at [facility] who reports that both the carvedilol and furosemide have been called in and filled for the patient, for a $3.30 copay each.  Call patient and his sister back to let them know that the medications are ready and of the copays. Review with patient that he will be taking the carvedilol twice daily, like his metformin. Counsel the patient to take his furosemide once daily in the morning to avoid increasing his urination overnight. Patient verbalizes understanding and states that he will pick up the medications today.  Patient reports that he organizes his medications himself. Reports that he keeps them all in one bag and that every morning and every evening he takes these out, lines them up, and takes his medications. Discuss taking these medications with breakfast and supper to help ensure that he remembers them every day. Also discuss using a pillbox, but patient  declines. Patient reports that he does not miss any doses, as taking his medication is such a part of his routine. Offer to come to meet with patient in his home to help to organize the medications and/or discuss medication with him in person. Patient declines, stating that he thinks that he has it well handled.  Patient reports that he has no further questions for me at this time. Provide patient with my phone number. Will close pharmacy episode for now.  Harlow Asa, PharmD Clinical Pharmacist [place] Management [phone]

## 2015-04-16 ENCOUNTER — Other Ambulatory Visit: Payer: Self-pay | Admitting: *Deleted

## 2015-04-16 NOTE — Patient Outreach (Signed)
RNCM called home number listed, a woman answered and when RNCM asked to speak to pt she became angry stating " if you want to talk to him you need to call his cell phone." and hung up. RNCM attempted pt's cell phone number and was unable to reach pt. HIPAA compliant voicemail left.  Plan: Will await for a return call from pt. Will attempt outreach again tomorrow  Malaysia Crance RN, BSN  [facility] Care Management [phone])

## 2015-04-17 ENCOUNTER — Other Ambulatory Visit: Payer: Self-pay | Admitting: *Deleted

## 2015-04-17 NOTE — Patient Outreach (Signed)
RNCM made a follow up call to pt related to cardiology visits. Pt stated the cardiologist was ordering him a sleep apnea study to be done soon. RNCM educated pt on the importance of following through with the sleep study pt verbalized understanding.  Pt stated cardiologist had read the monitor the pt had worn and stated it all looked good. Pt stated he had all of his medications and was taking them as prescribed. Pt stated the doctor has ordered for him to start monitoring his blood sugar and sent in a script for a glucometer. RNCM educated pt about the importance of following through with obtaining meter and checking sugars. Pt stated he was continuing to weigh every day and his weight today was 210 lbs. Pt stated he was continuing to be SOB and felt he had extra fluid on his abdomen. RNCM asked pt if he had let cardiologist know this when he saw him yesterday and pt said yes. RNCM made pt aware she was transitioning to another job and he would be followed by another RNCM. Pt stated thanked [facility] for helping him start getting his health back on track.   Plan: RNCM will report to oncoming RNCM.  Rutherford Limerick RN, BSN  [facility] Care Management [phone])

## 2015-04-21 ENCOUNTER — Other Ambulatory Visit: Payer: Self-pay | Admitting: *Deleted

## 2015-04-21 NOTE — Patient Outreach (Signed)
  [facility]) Care Management  [facility] Social Work  04/21/2015  Daniel Frye [birth year] [id number]  Subjective:  Patient is a 65 year old male.  Objective:   Current Medications:  Current Outpatient Prescriptions  Medication Sig Dispense Refill  . albuterol (PROVENTIL) (2.5 MG/3ML) 0.083% nebulizer solution Take 3 mLs (2.5 mg total) by nebulization every 4 (four) hours as needed for wheezing or shortness of breath. 75 mL 3  . albuterol (VENTOLIN HFA) 108 (90 BASE) MCG/ACT inhaler Inhale 2 puffs into the lungs every 4 (four) hours as needed for wheezing or shortness of breath. (Patient not taking: Reported on 03/19/2015) 18 g 3  . carvedilol (COREG) 3.125 MG tablet Take 3.125 mg by mouth 2 (two) times daily with a meal.    . Cholecalciferol (VITAMIN D) 2000 UNITS CAPS Take 1 capsule (2,000 Units total) by mouth daily. 30 capsule   . furosemide (LASIX) 20 MG tablet Take 20 mg by mouth daily.    . hydrochlorothiazide (HYDRODIURIL) 25 MG tablet Take 1 tablet (25 mg total) by mouth daily. 30 tablet 2  . Lactase (DAIRY DIGESTIVE SUPPLEMENT PO) Take 1 tablet by mouth 2 (two) times daily. Reported on 04/03/2015    . lisinopril (PRINIVIL,ZESTRIL) 40 MG tablet Take 1 tablet by mouth daily at 2 PM. Reported on 03/19/2015    . metFORMIN (GLUCOPHAGE) 500 MG tablet Take 1 tablet (500 mg total) by mouth 2 (two) times daily with a meal. 60 tablet 2   No current facility-administered medications for this visit.    Functional Status:  In your present state of health, do you have any difficulty performing the following activities: 03/19/2015 03/19/2015  Hearing? [name]  Vision? Y N  Difficulty concentrating or making decisions? [name]  Walking or climbing stairs? Y Y  Dressing or bathing? N N  Doing errands, shopping? [name]  Preparing Food and eating ? N -  Using the Toilet? N -  In the past six months, have you accidently leaked urine? Y Y  Do you have problems with loss of bowel control? N -   Managing your Medications? Y -  Managing your Finances? Y -  Housekeeping or managing your Housekeeping? Y -    Fall/Depression Screening:  PHQ 2/9 Scores 03/10/2015 11/26/2014  PHQ - 2 Score 2 0  PHQ- 9 Score 3 -    Assessment: This Education officer, museum met patient at the [facility] in [place], to complete Advanced Directive and to have it notarized.  Advanced Directive completed, however patient could not get it notarized because he did not have his identification card.  Per patient, he will take the document to his bank, he is sure that he can have it notarized.   Per patient, he has not contacted his doctor to request a referral for a hearing test.  Patient encouraged to get this done by the next home visit.  Plan: This social worker will follow up with patient on 05/07/15 to follow up on referral for hearing test and getting his advanced directive notarized.

## 2015-04-28 ENCOUNTER — Ambulatory Visit: Payer: Self-pay | Admitting: Family Medicine

## 2015-04-29 ENCOUNTER — Encounter: Payer: Self-pay | Admitting: Family Medicine

## 2015-04-29 ENCOUNTER — Telehealth: Payer: Self-pay

## 2015-04-29 ENCOUNTER — Ambulatory Visit (INDEPENDENT_AMBULATORY_CARE_PROVIDER_SITE_OTHER): Payer: Medicare PPO | Admitting: Family Medicine

## 2015-04-29 VITALS — BP 148/92 | HR 92 | Temp 98.3°F | Resp 16 | Ht 70.0 in | Wt 211.0 lb

## 2015-04-29 DIAGNOSIS — I10 Essential (primary) hypertension: Secondary | ICD-10-CM

## 2015-04-29 DIAGNOSIS — E559 Vitamin D deficiency, unspecified: Secondary | ICD-10-CM

## 2015-04-29 DIAGNOSIS — J449 Chronic obstructive pulmonary disease, unspecified: Secondary | ICD-10-CM

## 2015-04-29 DIAGNOSIS — I5032 Chronic diastolic (congestive) heart failure: Secondary | ICD-10-CM | POA: Diagnosis not present

## 2015-04-29 DIAGNOSIS — E119 Type 2 diabetes mellitus without complications: Secondary | ICD-10-CM | POA: Diagnosis not present

## 2015-04-29 DIAGNOSIS — G4730 Sleep apnea, unspecified: Secondary | ICD-10-CM

## 2015-04-29 DIAGNOSIS — R140 Abdominal distension (gaseous): Secondary | ICD-10-CM

## 2015-04-29 DIAGNOSIS — E669 Obesity, unspecified: Secondary | ICD-10-CM | POA: Diagnosis not present

## 2015-04-29 MED ORDER — CARVEDILOL 6.25 MG PO TABS: 6.2500 mg | ORAL_TABLET | Freq: Two times a day (BID) | ORAL | Status: DC

## 2015-04-29 MED ORDER — ALBUTEROL SULFATE HFA 108 (90 BASE) MCG/ACT IN AERS
2.0000 | INHALATION_SPRAY | RESPIRATORY_TRACT | Status: DC | PRN
Start: 2015-04-29 — End: 2015-09-25

## 2015-04-29 MED ORDER — FUROSEMIDE 40 MG PO TABS: 40.0000 mg | ORAL_TABLET | Freq: Every day | ORAL | Status: DC

## 2015-04-29 NOTE — Telephone Encounter (Signed)
Albuterol MDI refill sent, no indication for prednisone use found at visit

## 2015-04-29 NOTE — Telephone Encounter (Signed)
Can not get answer at patients numbers will try again tomorrow.

## 2015-04-29 NOTE — Telephone Encounter (Signed)
He mentioned wanting prednisone but I found no reason to prescribe it (his lungs were clear).

## 2015-04-29 NOTE — Telephone Encounter (Signed)
He said he asked for the prednisone for SOB today at visit.

## 2015-04-29 NOTE — Telephone Encounter (Signed)
Patient wants refill on Ventolin inhaler as well as Rx for prednisone. He said he asked for these during appt. [facility]

## 2015-04-29 NOTE — Patient Instructions (Addendum)
Increase Lasix (furosemide) to 40 mg daily (double your current dose). Increase Coreg (carvedilol) to 6.25 mg daily (double your current dose). New prescriptions sent to pharmacy. Schedule abdominal ultrasound and sleep study.

## 2015-04-30 ENCOUNTER — Other Ambulatory Visit: Payer: Self-pay | Admitting: *Deleted

## 2015-04-30 ENCOUNTER — Encounter: Payer: Self-pay | Admitting: *Deleted

## 2015-04-30 NOTE — Patient Outreach (Signed)
[facility]) Care Management   04/30/2015  Daniel Frye 1950/07/26 [id number]  Daniel Frye is an 65 y.o. male  Subjective: Pt reports f/u with Dr. Vicente Masson yesterday about abdomen distended,was told had fluid in abdomen.  Pt reports  MD changed two of his medications, picked them up today.   Pt states to f/u with MD again 3/8.  Pt reports received sleep study test in the mail, needed help in how to use it.    Pt reports weights range 211-214 lbs, today 211 lbs. Pt reports did not receive glucometer in the mail yet.     Objective:   Filed Vitals:   04/30/15 1604  BP: 100/70  Pulse: 89  Resp: 20     ROS  Physical Exam  Constitutional: He is oriented to person, place, and time. He appears well-developed and well-nourished.  Cardiovascular: Normal rate and regular rhythm.   Respiratory: Effort normal and breath sounds normal.  GI: He exhibits distension.  Abdomen distended.      Musculoskeletal: Normal range of motion.  Neurological: He is alert and oriented to person, place, and time.  Skin: Skin is warm and dry.  Psychiatric: He has a normal mood and affect. His behavior is normal. Judgment and thought content normal.    Current Medications:  Reviewed with pt   Current Outpatient Prescriptions  Medication Sig Dispense Refill  . albuterol (PROVENTIL) (2.5 MG/3ML) 0.083% nebulizer solution Take 3 mLs (2.5 mg total) by nebulization every 4 (four) hours as needed for wheezing or shortness of breath. 75 mL 3  . albuterol (VENTOLIN HFA) 108 (90 Base) MCG/ACT inhaler Inhale 2 puffs into the lungs every 4 (four) hours as needed for wheezing or shortness of breath. 18 g 1  . carvedilol (COREG) 6.25 MG tablet Take 1 tablet (6.25 mg total) by mouth 2 (two) times daily with a meal. 60 tablet 2  . Cholecalciferol (VITAMIN D) 2000 UNITS CAPS Take 1 capsule (2,000 Units total) by mouth daily. 30 capsule   . furosemide (LASIX) 40 MG tablet Take 1 tablet (40 mg total) by mouth  daily. 30 tablet 2  . lisinopril (PRINIVIL,ZESTRIL) 40 MG tablet Take 1 tablet by mouth daily at 2 PM. Reported on 03/19/2015    . metFORMIN (GLUCOPHAGE) 500 MG tablet Take 1 tablet (500 mg total) by mouth 2 (two) times daily with a meal. 60 tablet 2  . Lactase (DAIRY DIGESTIVE SUPPLEMENT PO) Take 1 tablet by mouth 2 (two) times daily. Reported on 04/30/2015     No current facility-administered medications for this visit.    Functional Status:   In your present state of health, do you have any difficulty performing the following activities: 04/29/2015 04/29/2015  Hearing? N N  Vision? N N  Difficulty concentrating or making decisions? N N  Walking or climbing stairs? Y N  Dressing or bathing? N N  Doing errands, shopping? N N  Preparing Food and eating ? - -  Using the Toilet? - -  In the past six months, have you accidently leaked urine? - -  Do you have problems with loss of bowel control? - -  Managing your Medications? - -  Managing your Finances? - -  Housekeeping or managing your Housekeeping? - -    Fall/Depression Screening:    PHQ 2/9 Scores 04/29/2015 03/10/2015 11/26/2014  PHQ - 2 Score 0 2 0  PHQ- 9 Score - 3 -    Assessment:  HF- pt's abdomen distended,pt reports  weights  range 211-214 lbs.  Sob noted.                           RN CM provided with [facility] calendar - start recording weights, call MD if weight gain >3 lbs in a day,                                    5 lbs in a week.      Plan: Pt to complete sleep study test, mail  results as ordered.            Pt to continue to weigh, record in [facility] calendar,  call MD for weight gain.            RN CM left voice message with Dr. Chinita Greenland nurse pt's need for glucometer.            RN CM to provide ongoing community nurse case management services, f/u again telephonically 3/9- check on                Status.             RN CM to inform Dr. Vicente Masson of [facility] involvement, case transitioned from previous RN CM Janci.               THN  CM Care Plan Problem One        Most Recent Value   THN CM Short Term Goal #3 (0-30 days)  Pt will complete  the sleep apnea testing in the next 14 days .   THN CM Short Term Goal #3 Start Date  04/30/15   Interventions for Short Tern Goal #3  Demonstrated to pt how to do sleep apnea test, pt voiced understanding.    THN CM Short Term Goal #4 (0-30 days)  Pt will obtain glucometer to check sugars in the next 30 days.   THN CM Short Term Goal #4 Start Date  04/30/15   Interventions for Short Term Goal #4  RN CM left voice message with MD's nurse requesting glucometer be mailed to pt.      Zara Chess.   [place] Care Management  [phone]

## 2015-05-01 ENCOUNTER — Telehealth: Payer: Self-pay

## 2015-05-01 ENCOUNTER — Other Ambulatory Visit: Payer: Self-pay | Admitting: Family Medicine

## 2015-05-01 DIAGNOSIS — M069 Rheumatoid arthritis, unspecified: Secondary | ICD-10-CM

## 2015-05-01 NOTE — Progress Notes (Signed)
Date:  04/29/2015   Name:  Daniel Frye   DOB:  10-31-1950   MRN:  [id number]  PCP:  [name], MD    Chief Complaint: Hypertension and Shortness of Breath   History of Present Illness:  This is a 65 y.o. male with CHF/COPD/DM seen by cardiology last month for chronic diastolic CHF and placed on Coreg and Lasix. Pt reports some initial improvement but increased abd distension and SOB over past week. Using albuterol which helps some. Requests prednisone as has made him feel better in past. Cards recommended eval for OSA as he fell asleep multiple times during visit. Taking vit D supplement.  Review of Systems:  Review of Systems  Constitutional: Negative for fever.  Respiratory: Negative for cough.   Cardiovascular: Negative for chest pain.  Endocrine: Negative for polyuria.  Genitourinary: Negative for difficulty urinating.  Neurological: Negative for syncope and light-headedness.    Patient Active Problem List   Diagnosis Date Noted  . Apnea, sleep 03/30/2015  . Chronic diastolic heart failure ([place]) 03/30/2015  . Breathlessness on exertion 03/30/2015  . Edema of foot 03/30/2015  . Alcohol abuse 03/23/2015  . Noncompliance 03/23/2015  . Vitamin D deficiency 01/27/2015  . Diabetes mellitus type 2, controlled, without complications ([place]) 99991111  . Obesity (BMI 30.0-34.9) 11/26/2014  . COPD (chronic obstructive pulmonary disease) ([place]) 06/22/2014  . Essential (primary) hypertension 06/22/2014  . Arthritis or polyarthritis, rheumatoid ([place]) 06/22/2014  . History of drug abuse 03/05/2009    Prior to Admission medications   Medication Sig Start Date End Date Taking? Authorizing Provider  albuterol (PROVENTIL) (2.5 MG/3ML) 0.083% nebulizer solution Take 3 mLs (2.5 mg total) by nebulization every 4 (four) hours as needed for wheezing or shortness of breath. 11/26/14 11/26/15 Yes [name], MD  carvedilol (COREG) 6.25 MG tablet Take 1 tablet (6.25 mg total) by mouth 2 (two)  times daily with a meal. 04/29/15  Yes [name], MD  Cholecalciferol (VITAMIN D) 2000 UNITS CAPS Take 1 capsule (2,000 Units total) by mouth daily. 01/27/15  Yes [name], MD  furosemide (LASIX) 40 MG tablet Take 1 tablet (40 mg total) by mouth daily. 04/29/15  Yes [name], MD  lisinopril (PRINIVIL,ZESTRIL) 40 MG tablet Take 1 tablet by mouth daily at 2 PM. Reported on 03/19/2015 08/20/14  Yes Historical Provider, MD  metFORMIN (GLUCOPHAGE) 500 MG tablet Take 1 tablet (500 mg total) by mouth 2 (two) times daily with a meal. 01/27/15  Yes [name], MD  albuterol (VENTOLIN HFA) 108 (90 Base) MCG/ACT inhaler Inhale 2 puffs into the lungs every 4 (four) hours as needed for wheezing or shortness of breath. 04/29/15   [name], MD  Lactase (DAIRY DIGESTIVE SUPPLEMENT PO) Take 1 tablet by mouth 2 (two) times daily. Reported on 04/30/2015    Historical Provider, MD    No Known Allergies  Past Surgical History  Procedure Laterality Date  . Heart surgery to drain fluid N/A 1987    Heart surgery to remove fluid     Social History  Substance Use Topics  . Smoking status: Former Smoker    Quit date: 05/06/2014  . Smokeless tobacco: None  . Alcohol Use: 0.0 oz/week    0 Standard drinks or equivalent per week    History reviewed. No pertinent family history.  Medication list has been reviewed and updated.  Physical Examination: BP 148/92 mmHg  Pulse 92  Temp(Src) 98.3 F (36.8 C) (Oral)  Resp 16  Ht 5\' 10"  (1.778 m)  Wt 211 lb (95.709 kg)  BMI 30.28 kg/m2  SpO2 95%  Physical Exam  Constitutional: He appears well-developed and well-nourished.  Cardiovascular: Normal rate, regular rhythm and normal heart sounds.   Pulmonary/Chest: Effort normal and breath sounds normal. He has no wheezes. He has no rales.  Abdominal: Soft. There is no tenderness.  Mildly distended  Musculoskeletal:  1+ BLE edema  Neurological: He is alert.  Skin: Skin is warm and dry.   Psychiatric: He has a normal mood and affect. His behavior is normal.  Nursing note and vitals reviewed.   Assessment and Plan:  1. Chronic diastolic heart failure (HCC) Appears less compensated, ncrease Lasix to 40 mg daily  2. Chronic obstructive pulmonary disease, unspecified COPD type ([place]) Lungs clear, no clear indication for prednisone, refill albuterol MDI  3. Essential (primary) hypertension Poor control, increase Coreg to 6.25 mg bid  4. Controlled type 2 diabetes mellitus without complication, without long-term current use of insulin (HCC) A1c 6.7 in Nov, cont metformin  5. Apnea, sleep, probable - Ambulatory referral to Sleep Studies  6. Vitamin D deficiency Cont supplement  7. Obesity (BMI 30.0-34.9)  8. Abdominal distension Possible ascites - US Abdomen Complete; Future  Return in about 2 weeks (around 05/13/2015).  Satira Anis. [facility]  05/01/2015

## 2015-05-01 NOTE — Telephone Encounter (Signed)
No need for glucometer at present as diabetes well controlled.

## 2015-05-01 NOTE — Telephone Encounter (Signed)
Patient told caseworker who called that nurse said she would call in Glucometer. I do not remember discussing this at all. Do you want me to call in a kit for him?

## 2015-05-01 NOTE — Telephone Encounter (Signed)
Advised on VM  

## 2015-05-04 ENCOUNTER — Other Ambulatory Visit: Payer: Self-pay | Admitting: Family Medicine

## 2015-05-04 ENCOUNTER — Other Ambulatory Visit: Payer: Self-pay

## 2015-05-04 DIAGNOSIS — R140 Abdominal distension (gaseous): Secondary | ICD-10-CM

## 2015-05-05 ENCOUNTER — Telehealth: Payer: Self-pay

## 2015-05-05 ENCOUNTER — Ambulatory Visit
Admission: RE | Admit: 2015-05-05 | Discharge: 2015-05-05 | Disposition: A | Discharge status: Home / Self Care | Payer: Medicare PPO | Source: Ambulatory Visit | Attending: Family Medicine | Admitting: Family Medicine

## 2015-05-05 DIAGNOSIS — R140 Abdominal distension (gaseous): Secondary | ICD-10-CM | POA: Insufficient documentation

## 2015-05-05 NOTE — Telephone Encounter (Signed)
-----   Message from [name], MD sent at 05/05/2015  9:40 AM EST ----- Inform ultrasound showed no fluid in abdomen.

## 2015-05-05 NOTE — Telephone Encounter (Signed)
Can see in office this week.

## 2015-05-05 NOTE — Telephone Encounter (Signed)
Patient advised of results. Patient states that he would like to know what to do next. He states that swelling is no better and he feels like "something is terribly wrong."

## 2015-05-06 NOTE — Telephone Encounter (Signed)
Informed patient to call for appointment.

## 2015-05-07 ENCOUNTER — Other Ambulatory Visit: Payer: Self-pay | Admitting: *Deleted

## 2015-05-07 NOTE — Patient Outreach (Signed)
[facility]) Care Management  [facility] Social Work  05/07/2015  Landrey Feliz 11-19-1950 [id number]  Subjective:  Patient is a 65 year old male.  Objective:   Current Medications:  Current Outpatient Prescriptions  Medication Sig Dispense Refill  . albuterol (PROVENTIL) (2.5 MG/3ML) 0.083% nebulizer solution Take 3 mLs (2.5 mg total) by nebulization every 4 (four) hours as needed for wheezing or shortness of breath. 75 mL 3  . albuterol (VENTOLIN HFA) 108 (90 Base) MCG/ACT inhaler Inhale 2 puffs into the lungs every 4 (four) hours as needed for wheezing or shortness of breath. 18 g 1  . carvedilol (COREG) 6.25 MG tablet Take 1 tablet (6.25 mg total) by mouth 2 (two) times daily with a meal. 60 tablet 2  . Cholecalciferol (VITAMIN D) 2000 UNITS CAPS Take 1 capsule (2,000 Units total) by mouth daily. 30 capsule   . furosemide (LASIX) 40 MG tablet Take 1 tablet (40 mg total) by mouth daily. 30 tablet 2  . lisinopril (PRINIVIL,ZESTRIL) 40 MG tablet Take 1 tablet by mouth daily at 2 PM. Reported on 03/19/2015    . metFORMIN (GLUCOPHAGE) 500 MG tablet Take 1 tablet (500 mg total) by mouth 2 (two) times daily with a meal. 60 tablet 2  . Lactase (DAIRY DIGESTIVE SUPPLEMENT PO) Take 1 tablet by mouth 2 (two) times daily. Reported on 05/07/2015     No current facility-administered medications for this visit.    Functional Status:  In your present state of health, do you have any difficulty performing the following activities: 05/07/2015 04/29/2015  Hearing? - N  Vision? - N  Difficulty concentrating or making decisions? - N  Walking or climbing stairs? - Y  Dressing or bathing? - N  Doing errands, shopping? - N  Conservation officer, nature and eating ? N -  Using the Toilet? N -  In the past six months, have you accidently leaked urine? Y -  Do you have problems with loss of bowel control? N -  Managing your Medications? N -  Managing your Finances? N -  Housekeeping or managing your  Housekeeping? N -    Fall/Depression Screening:  PHQ 2/9 Scores 04/29/2015 03/10/2015 11/26/2014  PHQ - 2 Score 0 2 0  PHQ- 9 Score - 3 -    Assessment: Home visit to see patient.  Per patient, he has not been feeling well.  Complains of trouble breathing.  Per patient, he has seen the doctor about this on 05/05/15 and had a ultrasound.  Per patient, he will follow up with Dr. Vicente Masson on 05/13/15.  Per patient, he will have a friend transport him to this appointment. Phone call to [facility], however patient resides in [place] and it was confirmed that [employer] does not travel outside of [place].  [place] contacted [phone], however they only cover [facility] and [place].  Patient will continue to request rides from family and friends to medical appointments.  Patient has advanced directive completed, however he has not had the document notarized yet.  Phone call to patient's doctor's office, they do not have a notary there. Per patient, he will go to the bank tomorrow when he he goes out to pay his bills. Hearing aid discussed and process of getting the hearing aid funded through the Division of the Deaf and Hard of Hearing.  Per patient, his hearing is not that "critical" he does not want to pursue this process right now.  Information provided  to patient.  Plan: This Education officer, museum will follow up with patient on 1 week regarding notarizing his Advanced Directive.    Sheralyn Boatman [facility] Care Management [phone]

## 2015-05-13 ENCOUNTER — Ambulatory Visit: Payer: Self-pay | Admitting: Family Medicine

## 2015-05-14 ENCOUNTER — Other Ambulatory Visit: Payer: Self-pay | Admitting: *Deleted

## 2015-05-14 NOTE — Patient Outreach (Signed)
Follow up phone call:  Pt reports breathing better, swelling down, compliant with his medications.  Pt reports no recent f/u visits with Dr. Vicente Masson.   Pt acknowledged results of recent Ultrasound done showed no fluid on abdomen.  Pt reports sleep study was done.   RN CM informed pt received a voice message from MD's CMA  about glucometer, that MD saw no need for glucometer now as  Diabetes are  well controlled.    Discussed with pt plan to f/u again 3/30- home visit.      Zara Chess.   [place] Care Management  [phone]

## 2015-05-28 ENCOUNTER — Other Ambulatory Visit: Payer: Self-pay | Admitting: *Deleted

## 2015-05-28 NOTE — Patient Outreach (Signed)
[facility]) Care Management  05/28/2015  Daniel Frye 05-21-1950 [id number]   Phone call to patient to follow up with patient regarding the completion of his advanced directive.  Per patient, he has not had the document sent yet but will be admitted to [facility] on 06/02/15 overnight and will request to have it notarized then.    Plan:  This social worker will follow up with patient following his hospital stay on 06/02/15.   Sheralyn Boatman [facility] Care Management [phone]

## 2015-06-02 ENCOUNTER — Ambulatory Visit: Payer: Medicare PPO | Attending: Otolaryngology

## 2015-06-04 ENCOUNTER — Other Ambulatory Visit: Payer: Self-pay | Admitting: *Deleted

## 2015-06-04 ENCOUNTER — Encounter: Payer: Self-pay | Admitting: *Deleted

## 2015-06-05 NOTE — Patient Outreach (Signed)
[facility]) Care Management  Home visit done 06/04/15  Daniel Frye 04-22-1950 [id number]  Daniel Frye is an 65 y.o. male  Subjective: Pt reports swelling in abdomen down.  Pt reports sob with exertion.  Pt reports waiting for a Call back, reschedule sleep study.  Pt reports appetite not back like it should, has one meal a day, drinks 1/5 bottle of wine or whiskey a day.   Pt reports checks sugars average once a week, last MD visit told sugars were good.    Objective:   Filed Vitals:   06/04/15 1324 06/04/15 1412  BP: 150/82 150/110  Pulse: 92   Resp: 20     ROS  Physical Exam  Alert and orientated.  Abdomen soft, BS+.   Wheezing noted on expiration in left upper lobe.  HRR.    Current Medications:  Reviewed with pt  Current Outpatient Prescriptions  Medication Sig Dispense Refill  . albuterol (PROVENTIL) (2.5 MG/3ML) 0.083% nebulizer solution Take 3 mLs (2.5 mg total) by nebulization every 4 (four) hours as needed for wheezing or shortness of breath. 75 mL 3  . albuterol (VENTOLIN HFA) 108 (90 Base) MCG/ACT inhaler Inhale 2 puffs into the lungs every 4 (four) hours as needed for wheezing or shortness of breath. 18 g 1  . carvedilol (COREG) 6.25 MG tablet Take 1 tablet (6.25 mg total) by mouth 2 (two) times daily with a meal. 60 tablet 2  . Cholecalciferol (VITAMIN D) 2000 UNITS CAPS Take 1 capsule (2,000 Units total) by mouth daily. 30 capsule   . furosemide (LASIX) 40 MG tablet Take 1 tablet (40 mg total) by mouth daily. 30 tablet 2  . Lactase (DAIRY DIGESTIVE SUPPLEMENT PO) Take 1 tablet by mouth 2 (two) times daily. Reported on 05/07/2015    . lisinopril (PRINIVIL,ZESTRIL) 40 MG tablet Take 1 tablet by mouth daily at 2 PM. Reported on 03/19/2015    . metFORMIN (GLUCOPHAGE) 500 MG tablet Take 1 tablet (500 mg total) by mouth 2 (two) times daily with a meal. 60 tablet 2   No current facility-administered medications for this visit.    Functional Status:   In  your present state of health, do you have any difficulty performing the following activities: 05/07/2015 04/29/2015  Hearing? - N  Vision? - N  Difficulty concentrating or making decisions? - N  Walking or climbing stairs? - Y  Dressing or bathing? - N  Doing errands, shopping? - N  Conservation officer, nature and eating ? N -  Using the Toilet? N -  In the past six months, have you accidently leaked urine? Y -  Do you have problems with loss of bowel control? N -  Managing your Medications? N -  Managing your Finances? N -  Housekeeping or managing your Housekeeping? N -    Fall/Depression Screening:    PHQ 2/9 Scores 06/04/2015 04/29/2015 03/10/2015 11/26/2014  PHQ - 2 Score 2 0 2 0  PHQ- 9 Score 8 - 3 -    Assessment:  Upon arrival, pt working on his paintings, continued during home visit.                        HF- swelling down in abdomen, soft.  Pt not compliant with Low Na+ diet, reported had  potato chips for supper yesterday, finishing up today.                           HTN-  BP today 150/110 right arm, 150/82 left arm.  Again, compliance needed with Low                                Na+ diet.  As reported, pt taking Carvedilol only once a day, instructions for twice a day  Plan: Pt to start taking Carvedilol twice a day as ordered.            Pt to monitor sodium intake           Plan to continue to provide pt with community nurse case management services, next home visit 4/20.             THN CM Care Plan Problem One        Most Recent Value   Care Plan Problem One  Elevated BP    Role Documenting the Problem One  Care Management Coordinator   Care Plan for Problem One  Active   THN Long Term Goal (31-90 days)  Pt's BP would be in normal limits in the next 60 days    THN Long Term Goal Start Date  06/04/15   Interventions for Problem One Long Term Goal  Discussed with pt importance of low Na+ diet, compliance with medication    THN CM Short Term Goal #1  (0-30 days)  Pt will be compliant with BP medication as directed for the  next 30 days    THN CM Short Term Goal #1 Start Date  06/04/15   Interventions for Short Term Goal #1  Reviewed pt's medications, found pt taking Carvedilol once a day instead of twice a day, instructions given to take as directed.     THN CM Short Term Goal #2 (0-30 days)  Pt would be compliant with a Low Na+ diet in the next 30 days    THN CM Short Term Goal #2 Start Date  06/04/15   Interventions for Short Term Goal #2  Discussed with pt potato chips high in sodium (supper last night), will elevate BP    THN CM Care Plan Problem Two        Most Recent Value   Care Plan Problem Two  Hx of HF    Role Documenting the Problem Two  Care Management Coordinator   Care Plan for Problem Two  Active   Interventions for Problem Two Long Term Goal   Reinforced with pt medication compliance.     THN Long Term Goal (31-90) days  Pt breathing, swelling in abdomen will continue to stay down in next 31 days    THN Long Term Goal Start Date  05/14/15   [facility] Long Term Goal Met Date  06/04/15     Zara Chess.   [place] Care Management  [phone]

## 2015-06-25 ENCOUNTER — Other Ambulatory Visit: Payer: Self-pay | Admitting: *Deleted

## 2015-06-25 NOTE — Patient Outreach (Signed)
[facility]) Care Management    Error.         ROS  Physical Exam  Constitutional: He is oriented to person, place, and time. He appears well-developed and well-nourished.  Cardiovascular: Normal rate, regular rhythm and normal heart sounds.   Respiratory: Effort normal and breath sounds normal.  GI: Bowel sounds are normal. He exhibits distension.  Musculoskeletal: Normal range of motion.  Neurological: He is alert and oriented to person, place, and time.  Skin: Skin is dry.  Abrasion on right lower leg, left lower leg   Psychiatric: He has a normal mood and affect. His behavior is normal. Judgment and thought content normal.    Daniel Frye.   [facility] Care Management  [phone]

## 2015-06-26 NOTE — Patient Outreach (Addendum)
[facility]) Care Management   Home visit 06/25/15   Daniel Frye 04/25/1950 [id number]  Daniel Frye is an 65 y.o. male  Subjective:  Pt reports having falls in the last 3-4 weeks, twice last week, no warning.  Pt reports he has  No dizziness, legs give out, not related to his drinking. Pt reports yesterday he fell on concrete, hitting his Head resulting in a knot, gone down today.   Pt also reports he noticed yesterday was  having  Balance issues in church.   Pt reports he is scheduled to go for a sleep study 4/27.   Pt reports he is using  His Albuterol inhaler often after activity, 2-3 times a day, nebulizer treatment twice a day.    Objective:   Filed Vitals:   06/25/15 1048  BP: 138/100  Pulse: 79  Resp: 20    ROS  Physical Exam  Constitutional: He is oriented to person, place, and time. He appears well-developed and well-nourished.  Cardiovascular: Normal rate, regular rhythm and normal heart sounds.   Respiratory: Effort normal and breath sounds normal.  GI: Soft. He exhibits distension.  Musculoskeletal: Normal range of motion.  Neurological: He is alert and oriented to person, place, and time.  Skin: Skin is warm.  abrasions on bilateral lower legs- from recent falls.  Encounter Medications:  Reviewed with pt   Outpatient Encounter Prescriptions as of 06/25/2015  Medication Sig Note  . albuterol (PROVENTIL) (2.5 MG/3ML) 0.083% nebulizer solution Take 3 mLs (2.5 mg total) by nebulization every 4 (four) hours as needed for wheezing or shortness of breath. 03/19/2015: Only has two nebulizer doses left.   . carvedilol (COREG) 6.25 MG tablet Take 1 tablet (6.25 mg total) by mouth 2 (two) times daily with a meal. 06/04/2015: Pt been only taking once a day,  To start taking twice a day.   . Cholecalciferol (VITAMIN D) 2000 UNITS CAPS Take 1 capsule (2,000 Units total) by mouth daily. 05/07/2015: Per patient, he takes this medication daily  . furosemide (LASIX)  40 MG tablet Take 1 tablet (40 mg total) by mouth daily.   . Lactase (DAIRY DIGESTIVE SUPPLEMENT PO) Take 1 tablet by mouth 2 (two) times daily. Reported on 05/07/2015   . lisinopril (PRINIVIL,ZESTRIL) 40 MG tablet Take 1 tablet by mouth daily at 2 PM. Reported on 03/19/2015 03/19/2015: Does not have in the home pt states he ran out and is not taking meds  . metFORMIN (GLUCOPHAGE) 500 MG tablet Take 1 tablet (500 mg total) by mouth 2 (two) times daily with a meal. 05/07/2015: Patient has had this medication refilled  . albuterol (VENTOLIN HFA) 108 (90 Base) MCG/ACT inhaler Inhale 2 puffs into the lungs every 4 (four) hours as needed for wheezing or shortness of breath. 06/04/2015: Pt uses 7-8 times a day    No facility-administered encounter medications on file as of 06/25/2015.    Functional Status:   In your present state of health, do you have any difficulty performing the following activities: 05/07/2015 04/29/2015  Hearing? - N  Vision? - N  Difficulty concentrating or making decisions? - N  Walking or climbing stairs? - Y  Dressing or bathing? - N  Doing errands, shopping? - N  Conservation officer, nature and eating ? N -  Using the Toilet? N -  In the past six months, have you accidently leaked urine? Y -  Do you have problems with loss of bowel control? N -  Managing your Medications? N -  Managing your Finances? N -  Housekeeping or managing your Housekeeping? N -    Fall/Depression Screening:    PHQ 2/9 Scores 06/04/2015 04/29/2015 03/10/2015 11/26/2014  PHQ - 2 Score 2 0 2 0  PHQ- 9 Score 8 - 3 -    Assessment:  Pleasant 65 year old gentleman, lives by himself, sister lives next to him, supportive.                          Recent falls- per pt, falling more, no warning.  Abrasions noted on bilateral lower legs from recent falls.                                              Per pt- fell on concrete yesterday, knot on head.  Noted no knot on pt's head today.                                                RN CM called Dr. Chinita Greenland office, f/u appointment made for pt to f/u with MD 4/25.                          COPD- observed pt doing nebulizer treatment duriing home visit, did not complete entire treatment.                                     RN CM discussed with pt completing entire treatment, possibly help cut back on use of rescue                                     Inhaler (using 2-3 times a day), pt voiced understanding.                          HTN-  BP today 138/100 - prior to taking BP medications.    Plan:    Pt to f/u with Dr. Vicente Masson 4/25- acute office visit (recent falls), pt to  use cane at all times.               Pt to go for sleep study 4/27              Pt to continue to take BP medications as ordered.               Plan to continue to provide community nurse case management services, next h/v 5/22.                 THN CM Care Plan Problem One        Most Recent Value   Care Plan Problem One  Elevated BP    Role Documenting the Problem One  Care Management Coordinator   Care Plan for Problem One  Active   THN Long Term Goal (31-90 days)  Pt's BP would be in normal limits in the next 60 days    THN Long Term Goal Start Date  06/04/15   Interventions for Problem  One Long Term Goal  Reinforced ongoing compliance with BP medication.    THN CM Short Term Goal #1 (0-30 days)  Pt will be compliant with BP medication as directed for the  next 30 days    THN CM Short Term Goal #1 Start Date  06/04/15   [facility] CM Short Term Goal #1 Met Date  06/25/15   Interventions for Short Term Goal #1  Reviewed pt's medications, found pt taking Carvedilol once a day instead of twice a day, instructions given to take as directed.     THN CM Short Term Goal #2 (0-30 days)  Pt would be compliant with a Low Na+ diet in the next 30 days    THN CM Short Term Goal #2 Start Date  06/04/15   Interventions for Short Term Goal #2  Discussed with pt potato chips high in sodium (supper last night), will elevate  BP    THN CM Care Plan Problem Two        Most Recent Value   Care Plan Problem Two  Fall risk- recent falls, no warning    Role Documenting the Problem Two  Care Management Coordinator   Care Plan for Problem Two  Active   THN CM Short Term Goal #1 (0-30 days)  pt would no falls in the next 30 days    THN CM Short Term Goal #1 Start Date  06/25/15   Interventions for Short Term Goal #2   RN CM - appointment scheduled to have pt f/u with Dr. Vicente Masson  (recent multiple falls.)     Zara Chess.   [place] Care Management  [phone]

## 2015-06-30 ENCOUNTER — Ambulatory Visit: Payer: Self-pay | Admitting: Family Medicine

## 2015-07-02 ENCOUNTER — Ambulatory Visit: Payer: Medicare PPO | Attending: Otolaryngology

## 2015-07-02 DIAGNOSIS — G4733 Obstructive sleep apnea (adult) (pediatric): Secondary | ICD-10-CM | POA: Diagnosis not present

## 2015-07-02 DIAGNOSIS — G4736 Sleep related hypoventilation in conditions classified elsewhere: Secondary | ICD-10-CM | POA: Diagnosis not present

## 2015-07-02 DIAGNOSIS — R0683 Snoring: Secondary | ICD-10-CM | POA: Diagnosis not present

## 2015-07-03 ENCOUNTER — Other Ambulatory Visit: Payer: Self-pay | Admitting: *Deleted

## 2015-07-03 NOTE — Patient Outreach (Signed)
[facility]) Care Management  07/03/2015  Daniel Frye 11-Oct-1950 [id number]   Phone call to patient to provide assistance with his Advanced Directive.  Patient agreed for Hospice and Palliative care to assist with completing this document.  Phone call made to Hospice and Palliative care, message left with [facility] requesting a return call to schedule home visit to complete patient's advanced directive and to have it notarized.    Sheralyn Boatman [facility] Care Management [phone]

## 2015-07-14 ENCOUNTER — Telehealth: Payer: Self-pay

## 2015-07-14 MED ORDER — ALBUTEROL SULFATE (2.5 MG/3ML) 0.083% IN NEBU: 2.5000 mg | INHALATION_SOLUTION | RESPIRATORY_TRACT | Status: DC | PRN

## 2015-07-14 NOTE — Addendum Note (Signed)
Addended by: [name] on: 07/14/2015 09:37 AM   Modules accepted: Orders

## 2015-07-14 NOTE — Telephone Encounter (Signed)
Sent to [place] 

## 2015-07-23 ENCOUNTER — Ambulatory Visit: Payer: Medicare PPO | Attending: Otolaryngology

## 2015-07-23 DIAGNOSIS — G2581 Restless legs syndrome: Secondary | ICD-10-CM | POA: Diagnosis not present

## 2015-07-23 DIAGNOSIS — G4733 Obstructive sleep apnea (adult) (pediatric): Secondary | ICD-10-CM | POA: Diagnosis present

## 2015-07-23 DIAGNOSIS — R0683 Snoring: Secondary | ICD-10-CM | POA: Insufficient documentation

## 2015-07-27 ENCOUNTER — Other Ambulatory Visit: Payer: Self-pay | Admitting: *Deleted

## 2015-07-27 NOTE — Patient Outreach (Signed)
[place] [facility]) Care Management  07/27/2015  Cleto Coombe 1950-10-05 [id number]   Phone call to patient to follow up on his Advanced Directive and need to get the document notarized.  Per patient, he would still like assistance in getting the document notarized.  Voicemail message left for Hospice and [place] to request their assistance with notarizing patient's advanced directive.   Sheralyn Boatman [facility] Care Management [phone]

## 2015-07-27 NOTE — Patient Outreach (Addendum)
[facility]) Care Management   07/27/2015  Daniel Frye [birth year] [id number]  Daniel Frye is an 65 y.o. male  Subjective: Pt reports he went for second part of sleep study, have not heard back yet.  Pt reports weight is down, three days ago was 194 lbs, today 198 lbs with clothes/shoes on.  Pt reports no edema, breathing better,does not eat any salt.   Pt reports no falls since RN CM's last h/v, was dizzy one time/did not last long.   Pt reports he did not f/u with Primary Care MD from last home visit, did receive a call from someone to f/u after the sleep study, think it could be Primary Care MD office.  Pt reports no edema in legs, abdomen distended but no problems breathing.    Objective:   Filed Vitals:   07/27/15 1022 07/27/15 1029  BP: 122/84 130/90  Pulse: 81   Resp:  16    ROS  Physical Exam  Constitutional: He is oriented to person, place, and time. He appears well-developed and well-nourished.  Cardiovascular: Normal rate, regular rhythm and normal heart sounds.   Respiratory: Effort normal and breath sounds normal.  GI: Bowel sounds are normal. He exhibits distension.  Musculoskeletal: Normal range of motion. He exhibits no edema.  Neurological: He is alert and oriented to person, place, and time.  Skin: Skin is warm and dry.  Psychiatric: He has a normal mood and affect. His behavior is normal. Judgment and thought content normal.    Encounter Medications:   Outpatient Encounter Prescriptions as of 07/27/2015  Medication Sig Note  . albuterol (PROVENTIL) (2.5 MG/3ML) 0.083% nebulizer solution Take 3 mLs (2.5 mg total) by nebulization every 4 (four) hours as needed for wheezing or shortness of breath.   Marland Kitchen albuterol (VENTOLIN HFA) 108 (90 Base) MCG/ACT inhaler Inhale 2 puffs into the lungs every 4 (four) hours as needed for wheezing or shortness of breath. 06/04/2015: Pt uses 7-8 times a day   . carvedilol (COREG) 6.25 MG tablet Take 1 tablet (6.25 mg  total) by mouth 2 (two) times daily with a meal. 06/04/2015: Pt been only taking once a day,  To start taking twice a day.   . Cholecalciferol (VITAMIN D) 2000 UNITS CAPS Take 1 capsule (2,000 Units total) by mouth daily. 05/07/2015: Per patient, he takes this medication daily  . furosemide (LASIX) 40 MG tablet Take 1 tablet (40 mg total) by mouth daily.   Marland Kitchen lisinopril (PRINIVIL,ZESTRIL) 40 MG tablet Take 1 tablet by mouth daily at 2 PM. Reported on 03/19/2015 03/19/2015: Does not have in the home pt states he ran out and is not taking meds  . metFORMIN (GLUCOPHAGE) 500 MG tablet Take 1 tablet (500 mg total) by mouth 2 (two) times daily with a meal. 05/07/2015: Patient has had this medication refilled  . Lactase (DAIRY DIGESTIVE SUPPLEMENT PO) Take 1 tablet by mouth 2 (two) times daily. Reported on 07/27/2015    No facility-administered encounter medications on file as of 07/27/2015.    Functional Status:   In your present state of health, do you have any difficulty performing the following activities: 05/07/2015 04/29/2015  Hearing? - N  Vision? - N  Difficulty concentrating or making decisions? - N  Walking or climbing stairs? - Y  Dressing or bathing? - N  Doing errands, shopping? - N  Conservation officer, nature and eating ? N -  Using the Toilet? N -  In the past six months, have you accidently  leaked urine? Y -  Do you have problems with loss of bowel control? N -  Managing your Medications? N -  Managing your Finances? N -  Housekeeping or managing your Housekeeping? N -    Fall/Depression Screening:    PHQ 2/9 Scores 06/04/2015 04/29/2015 03/10/2015 11/26/2014  PHQ - 2 Score 2 0 2 0  PHQ- 9 Score 8 - 3 -    Assessment:  Pleasant 65 year old gentleman, lives alone with family close by.                            HTN- BP today 130/90 right arm, 122/84 left arm.                            HF- no edema in legs, abdomen has some distention but no complaints with issues breathing.                           Falls- per pt, no recent falls, dizzy one time/short duration   Plan:  As discussed with pt, plan to discharge  from RN CM services, goals met.             Plan to inform [facility] social worker of plan to discharge.                    Addendum:  5/23-  Spoke with [facility] social worker, to f/u with pt about notarizing Advanced Directives completed, then discharge.                         RN CM to send update letter to Dr. Vicente Masson  by in basket - discharging pt from RN CM services.                        Plan to route discharge home visit to Dr. Vicente Masson.                         As discussed with pt earlier, letter will be sent to pt about discharge from RN CM services.                                                              [facility] CM Care Plan Problem One        Most Recent Value   Care Plan Problem One  Elevated BP    Role Documenting the Problem One  Care Management Coordinator   Care Plan for Problem One  Active   [facility] Long Term Goal (31-90 days)  Pt's BP would be in normal limits in the next 60 days    [facility] Long Term Goal Start Date  06/04/15   [facility] Long Term Goal Met Date  07/27/15 [met- BP today 122/84 LA, 130/90 RA]   Interventions for Problem One Long Term Goal  Reinforced ongoing compliance with BP medication.    [facility] CM Short Term Goal #2 (0-30 days)  Pt would be compliant with a Low Na+ diet in the next 30 days    [facility] CM Short  Term Goal #2 Start Date  06/25/15   [facility] CM Short Term Goal #2 Met Date  07/27/15   Interventions for Short Term Goal #2  Reviewed with pt ongoing compliance with Low Na+ diet.      [facility] CM Care Plan Problem Two        Most Recent Value   Care Plan Problem Two  Fall risk- recent falls, no warning    Role Documenting the Problem Two  Care Management [place] for Problem Two  Active   [facility] Long Term Goal Start Date  06/25/15   [facility] CM Short Term Goal #1 (0-30 days)  pt would no falls in the next 30 days    [facility] CM Short Term Goal #1 Start  Date  06/25/15   [facility] CM Short Term Goal #1 Met Date   07/27/15 [met- pt reports no falls since RN CM h/v.  ]   Interventions for Short Term Goal #2   Reviewed with pt if any recent falls, reports none since last h/v.      Zara Chess.   [place] Care Management  [phone]

## 2015-07-28 ENCOUNTER — Encounter: Payer: Self-pay | Admitting: *Deleted

## 2015-07-29 ENCOUNTER — Other Ambulatory Visit: Payer: Self-pay | Admitting: *Deleted

## 2015-07-29 NOTE — Patient Outreach (Signed)
[facility] [facility]) Care Management  07/29/2015  Daniel Frye 09/22/1950 [id number]   Phone call from [facility] and Palliative care on 07/28/15 who agrees to assist patient with notarizing his Advanced Directive.  Phone call to patient, permission given to forward his contact information to [place] who will contact him to schedule a time to notarize his Advanced Directive.   Plan:  This Education officer, museum to continue to follow.    Sheralyn Boatman [facility] Care Management [phone]

## 2015-08-10 ENCOUNTER — Other Ambulatory Visit: Payer: Self-pay | Admitting: *Deleted

## 2015-08-10 ENCOUNTER — Encounter: Payer: Self-pay | Admitting: *Deleted

## 2015-08-10 NOTE — Patient Outreach (Addendum)
[place] [facility]) Care Management  08/10/2015  Daniel Frye May 08, 1950 [id number]   Email received from [facility] of Hospice and Palliative care confirming that she has assisted patient with the completion of his Advanced Directive and Living Will.  The document has been notarized as well.  Phone call made to patient to confirm and request that he take the document to his next doctor's visit. Per patient, the document is complete and he will give the document to his doctor during his next visit.    Patient has verbalized having no further social work needs. Patient's case to be closed to social work at this time.    Sheralyn Boatman [facility] Care Management [phone]

## 2015-09-23 ENCOUNTER — Encounter: Payer: Self-pay | Admitting: Internal Medicine

## 2015-09-25 ENCOUNTER — Ambulatory Visit (INDEPENDENT_AMBULATORY_CARE_PROVIDER_SITE_OTHER): Payer: Medicare PPO | Admitting: Internal Medicine

## 2015-09-25 ENCOUNTER — Encounter: Payer: Self-pay | Admitting: Internal Medicine

## 2015-09-25 VITALS — BP 124/84 | HR 88 | Ht 70.0 in | Wt 195.4 lb

## 2015-09-25 DIAGNOSIS — J449 Chronic obstructive pulmonary disease, unspecified: Secondary | ICD-10-CM | POA: Diagnosis not present

## 2015-09-25 DIAGNOSIS — E119 Type 2 diabetes mellitus without complications: Secondary | ICD-10-CM | POA: Diagnosis not present

## 2015-09-25 DIAGNOSIS — I5032 Chronic diastolic (congestive) heart failure: Secondary | ICD-10-CM

## 2015-09-25 MED ORDER — FLUTICASONE FUROATE-VILANTEROL 200-25 MCG/INH IN AEPB
1.0000 | INHALATION_SPRAY | Freq: Every day | RESPIRATORY_TRACT | Status: DC
Start: 2015-09-25 — End: 2017-01-30

## 2015-09-25 MED ORDER — ALBUTEROL SULFATE HFA 108 (90 BASE) MCG/ACT IN AERS: 2.0000 | INHALATION_SPRAY | RESPIRATORY_TRACT | Status: DC | PRN

## 2015-09-25 NOTE — Progress Notes (Signed)
Date:  09/25/2015   Name:  Daniel Frye   DOB:  27-Jun-1950   MRN:  [id number]   Chief Complaint: COPD COPD - breathing better since losing weight ~ 25 lbs.  He quit smoking about 8 months ago but has a 30 pk-yr history.  He has albuterol MDI to use as needed - three times per day or more. He has never been on a maintenance medication such as Advair. DM - last A1C was 6.7 and he was started on metformin which he reports he is taking.  He is not convinced that he is diabetic.  He is hesitant about have labs done today but eventually agreed.  Review of Systems  Constitutional: Positive for unexpected weight change. Negative for fever, chills and fatigue.  HENT: Negative for hearing loss.   Eyes: Negative for visual disturbance.  Respiratory: Positive for cough, chest tightness, shortness of breath and wheezing.   Cardiovascular: Negative for chest pain, palpitations and leg swelling.    Patient Active Problem List   Diagnosis Date Noted  . Obstructive sleep apnea of adult 03/30/2015  . Chronic diastolic heart failure ([place]) 03/30/2015  . Breathlessness on exertion 03/30/2015  . Edema of foot 03/30/2015  . Alcohol abuse 03/23/2015  . Noncompliance 03/23/2015  . Vitamin D deficiency 01/27/2015  . Diabetes mellitus type 2, controlled, without complications ([place]) 99991111  . Obesity (BMI 30.0-34.9) 11/26/2014  . COPD (chronic obstructive pulmonary disease) ([place]) 06/22/2014  . Essential (primary) hypertension 06/22/2014  . Arthritis or polyarthritis, rheumatoid ([place]) 06/22/2014  . History of drug abuse 03/05/2009    Prior to Admission medications   Medication Sig Start Date End Date Taking? Authorizing Provider  albuterol (PROVENTIL) (2.5 MG/3ML) 0.083% nebulizer solution Take 3 mLs (2.5 mg total) by nebulization every 4 (four) hours as needed for wheezing or shortness of breath. 07/14/15 07/13/16 Yes [name], MD  carvedilol (COREG) 6.25 MG tablet Take 1 tablet (6.25 mg total) by  mouth 2 (two) times daily with a meal. Patient taking differently: Take 3.125 mg by mouth 2 (two) times daily with a meal.  04/29/15  Yes [name], MD  Cholecalciferol (VITAMIN D) 2000 UNITS CAPS Take 1 capsule (2,000 Units total) by mouth daily. 01/27/15  Yes [name], MD  furosemide (LASIX) 40 MG tablet Take 1 tablet (40 mg total) by mouth daily. Patient taking differently: Take 20 mg by mouth daily.  04/29/15  Yes [name], MD  Lactase (DAIRY DIGESTIVE SUPPLEMENT PO) Take 1 tablet by mouth 2 (two) times daily. Reported on 07/27/2015   Yes Historical Provider, MD  lisinopril (PRINIVIL,ZESTRIL) 40 MG tablet Take 1 tablet by mouth daily at 2 PM. Reported on 03/19/2015 08/20/14  Yes Historical Provider, MD  metFORMIN (GLUCOPHAGE) 500 MG tablet Take 1 tablet (500 mg total) by mouth 2 (two) times daily with a meal. 01/27/15  Yes [name], MD    No Known Allergies  Past Surgical History  Procedure Laterality Date  . Heart surgery to drain fluid N/A 1987    Heart surgery to remove fluid     Social History  Substance Use Topics  . Smoking status: Former Smoker    Quit date: 05/06/2014  . Smokeless tobacco: None  . Alcohol Use: 1.2 - 1.8 oz/week    0 Standard drinks or equivalent, 2-3 Glasses of wine per week     Medication list has been reviewed and updated.   Physical Exam  Constitutional: He is oriented to person, place, and time.  He appears well-developed. No distress.  HENT:  Head: Normocephalic and atraumatic.  Cardiovascular: Normal rate, regular rhythm and normal heart sounds.   Pulmonary/Chest: Effort normal. No respiratory distress. He has decreased breath sounds. He has no wheezes. He has no rhonchi.  Neurological: He is alert and oriented to person, place, and time.  Skin: Skin is warm and dry. No rash noted.  Psychiatric: He has a normal mood and affect. His behavior is normal. Thought content normal.    BP 124/84 mmHg  Pulse 88  Ht 5\' 10"  (1.778 m)   Wt 195 lb 6.4 oz (88.633 kg)  BMI 28.04 kg/m2  SpO2 98%  Assessment and Plan: 1. Chronic obstructive pulmonary disease, unspecified COPD type ([place]) Using rescue inhaler 3-5 times per day Need to begin daily maintenance therapy - sample and Rx given - albuterol (VENTOLIN HFA) 108 (90 Base) MCG/ACT inhaler; Inhale 2 puffs into the lungs every 4 (four) hours as needed for wheezing or shortness of breath.  Dispense: 18 g; Refill: 5 - fluticasone furoate-vilanterol (BREO ELLIPTA) 200-25 MCG/INH AEPB; Inhale 1 puff into the lungs daily.  Dispense: 60 each; Refill: 5  2. Chronic diastolic heart failure (HCC) Improved with medication and weight loss  3. Controlled type 2 diabetes mellitus without complication, without long-term current use of insulin ([place]) Continue metformin - Hemoglobin 123456 - Basic metabolic panel   Halina Maidens, MD [facility]  09/25/2015

## 2015-09-25 NOTE — Patient Instructions (Signed)
Breo - one inhalation once a day.  Rinse mouth after use.

## 2015-09-26 LAB — BASIC METABOLIC PANEL
BUN/Creatinine Ratio: 8 — ABNORMAL LOW (ref 10–24)
BUN: 11 mg/dL (ref 8–27)
CO2: 20 mmol/L (ref 18–29)
Calcium: 9.3 mg/dL (ref 8.6–10.2)
Chloride: 98 mmol/L (ref 96–106)
Creatinine, Ser: 1.37 mg/dL — ABNORMAL HIGH (ref 0.76–1.27)
GFR calc Af Amer: 61 mL/min/{1.73_m2} (ref >59)
GFR calc non Af Amer: 53 mL/min/{1.73_m2} — ABNORMAL LOW (ref >59)
Glucose: 70 mg/dL (ref 65–99)
Potassium: 3.9 mmol/L (ref 3.5–5.2)
Sodium: 142 mmol/L (ref 134–144)

## 2015-09-26 LAB — HEMOGLOBIN A1C
Est. average glucose Bld gHb Est-mCnc: 114 mg/dL
Hgb A1c MFr Bld: 5.6 % (ref 4.8–5.6)

## 2016-01-22 ENCOUNTER — Encounter: Payer: Self-pay | Admitting: Internal Medicine

## 2016-01-22 ENCOUNTER — Ambulatory Visit (INDEPENDENT_AMBULATORY_CARE_PROVIDER_SITE_OTHER): Payer: Medicare PPO | Admitting: Internal Medicine

## 2016-01-22 ENCOUNTER — Other Ambulatory Visit: Payer: Self-pay | Admitting: Internal Medicine

## 2016-01-22 VITALS — BP 142/92 | HR 94 | Resp 16 | Ht 70.0 in | Wt 175.0 lb

## 2016-01-22 DIAGNOSIS — I5032 Chronic diastolic (congestive) heart failure: Secondary | ICD-10-CM | POA: Diagnosis not present

## 2016-01-22 DIAGNOSIS — I10 Essential (primary) hypertension: Secondary | ICD-10-CM

## 2016-01-22 DIAGNOSIS — J449 Chronic obstructive pulmonary disease, unspecified: Secondary | ICD-10-CM

## 2016-01-22 DIAGNOSIS — F1490 Cocaine use, unspecified, uncomplicated: Secondary | ICD-10-CM

## 2016-01-22 MED ORDER — CARVEDILOL 6.25 MG PO TABS
6.2500 mg | ORAL_TABLET | Freq: Two times a day (BID) | ORAL | 0 refills | Status: DC
Start: 2016-01-22 — End: 2017-06-30

## 2016-01-22 MED ORDER — FUROSEMIDE 40 MG PO TABS: 40.0000 mg | ORAL_TABLET | Freq: Every day | ORAL | 0 refills | Status: DC

## 2016-01-22 NOTE — Patient Instructions (Signed)
Use Breo inhaler every day

## 2016-01-22 NOTE — Progress Notes (Signed)
Date:  01/22/2016   Name:  Daniel Frye   DOB:  [birth year]   MRN:  [id number]   Chief Complaint: Cough (2 weeks) and Leg Swelling (Right Leg ) Cough  This is a new problem. The current episode started 1 to 4 weeks ago. The problem has been unchanged. The problem occurs every few hours. The cough is non-productive. Associated symptoms include wheezing. Pertinent negatives include no chest pain, chills, ear congestion, headaches, nasal congestion, shortness of breath or sweats. Exacerbated by: heat. He has tried a beta-agonist inhaler for the symptoms. The treatment provided mild relief. His past medical history is significant for COPD.   Leg Swelling - stopped all his medications several months ago because he felt well.  When his leg started to swell three weeks ago he resumed lasix.  He denies PND or orthopnea, chest pains or palpitations.  His diet is average with respect to sodium.  He does smoke crack cocaine several times per month.  He drinks a bottle of wine daily and a fifth of liquor every 2 weeks.  CHF - as above, off of all medications for several months.  He is agreeable to resuming the coreg.  Risk of cocaine use discussed and he wants to make an effort to quit using.   Review of Systems  Constitutional: Positive for unexpected weight change. Negative for appetite change, chills and diaphoresis.  HENT: Negative for trouble swallowing.   Respiratory: Positive for cough and wheezing. Negative for chest tightness and shortness of breath.   Cardiovascular: Positive for leg swelling. Negative for chest pain and palpitations.  Gastrointestinal: Negative for abdominal pain, constipation, nausea and vomiting.  Neurological: Negative for dizziness and headaches.  Psychiatric/Behavioral: Negative for sleep disturbance. The patient is not nervous/anxious.     Patient Active Problem List   Diagnosis Date Noted  . Obstructive sleep apnea of adult 03/30/2015  . Chronic diastolic heart  failure ([place]) 03/30/2015  . Edema of foot 03/30/2015  . Alcohol abuse 03/23/2015  . Noncompliance 03/23/2015  . Vitamin D deficiency 01/27/2015  . Diabetes mellitus type 2, controlled, without complications ([place]) 99991111  . Obesity (BMI 30.0-34.9) 11/26/2014  . COPD (chronic obstructive pulmonary disease) ([place]) 06/22/2014  . Essential (primary) hypertension 06/22/2014  . Arthritis or polyarthritis, rheumatoid ([place]) 06/22/2014  . History of drug abuse 03/05/2009    Prior to Admission medications   Medication Sig Start Date End Date Taking? Authorizing Provider  albuterol (PROVENTIL) (2.5 MG/3ML) 0.083% nebulizer solution Take 3 mLs (2.5 mg total) by nebulization every 4 (four) hours as needed for wheezing or shortness of breath. 07/14/15 07/13/16 Yes [name], MD  albuterol (VENTOLIN HFA) 108 (90 Base) MCG/ACT inhaler Inhale 2 puffs into the lungs every 4 (four) hours as needed for wheezing or shortness of breath. 09/25/15  Yes Glean Hess, MD  fluticasone furoate-vilanterol (BREO ELLIPTA) 200-25 MCG/INH AEPB Inhale 1 puff into the lungs daily. 09/25/15  Yes Glean Hess, MD  furosemide (LASIX) 40 MG tablet Take 1 tablet (40 mg total) by mouth daily. Patient taking differently: Take 20 mg by mouth daily.  04/29/15  Yes [name], MD  carvedilol (COREG) 6.25 MG tablet Take 1 tablet (6.25 mg total) by mouth 2 (two) times daily with a meal. Patient not taking: Reported on 01/22/2016 04/29/15   [name], MD  Cholecalciferol (VITAMIN D) 2000 UNITS CAPS Take 1 capsule (2,000 Units total) by mouth daily. Patient not taking: Reported on 01/22/2016 01/27/15   [name],  MD  Lactase (DAIRY DIGESTIVE SUPPLEMENT PO) Take 1 tablet by mouth 2 (two) times daily. Reported on 07/27/2015    Historical Provider, MD  lisinopril (PRINIVIL,ZESTRIL) 40 MG tablet Take 1 tablet by mouth daily at 2 PM. Reported on 03/19/2015 08/20/14   Historical Provider, MD  metFORMIN (GLUCOPHAGE) 500 MG tablet  Take 1 tablet (500 mg total) by mouth 2 (two) times daily with a meal. Patient not taking: Reported on 01/22/2016 01/27/15   [name], MD    No Known Allergies  Past Surgical History:  Procedure Laterality Date  . heart surgery to drain fluid N/A 1987   Heart surgery to remove fluid     Social History  Substance Use Topics  . Smoking status: Former Smoker    Packs/day: 0.33    Years: 50.00    Types: Cigarettes    Quit date: 05/06/2014  . Smokeless tobacco: Never Used  . Alcohol use 9.0 oz/week    15 Glasses of wine per week     Comment: Drinks 1/5 day of wine      Medication list has been reviewed and updated.   Physical Exam  Constitutional: He is oriented to person, place, and time. He appears well-developed. No distress.  HENT:  Head: Normocephalic and atraumatic.  Eyes: Pupils are equal, round, and reactive to light.  Neck: Neck supple. No JVD present. Carotid bruit is not present. No thyromegaly present.  Cardiovascular: Normal rate, regular rhythm and normal heart sounds.  Exam reveals no gallop.   No murmur heard. Pulmonary/Chest: Effort normal. No respiratory distress. He has decreased breath sounds. He has no wheezes. He has no rhonchi.  Abdominal: Soft. Normal appearance and bowel sounds are normal. There is no tenderness.  Musculoskeletal: Normal range of motion.  Neurological: He is alert and oriented to person, place, and time.  Skin: Skin is warm and dry. No rash noted.  Psychiatric: He has a normal mood and affect. His behavior is normal. Thought content normal.  Nursing note and vitals reviewed.  Wt Readings from Last 3 Encounters:  01/22/16 175 lb (79.4 kg)  09/25/15 195 lb 6.4 oz (88.6 kg)  07/27/15 198 lb (89.8 kg)    BP (!) 142/92   Pulse 94   Resp 16   Ht 5\' 10"  (1.778 m)   Wt 175 lb (79.4 kg)   SpO2 98%   BMI 25.11 kg/m   Assessment and Plan: 1. Chronic diastolic heart failure (HCC) Continue lasix; resume coreg - furosemide  (LASIX) 40 MG tablet; Take 1 tablet (40 mg total) by mouth daily.  Dispense: 30 tablet; Refill: 0  2. Essential (primary) hypertension Resume coreg Follow up in 3 weeks - carvedilol (COREG) 6.25 MG tablet; Take 1 tablet (6.25 mg total) by mouth 2 (two) times daily with a meal.  Dispense: 60 tablet; Refill: 0  3. Chronic obstructive pulmonary disease, unspecified COPD type ([place]) Instructed to use Breo every day; use ventolin as needed  4. Crack cocaine use Discussed adverse health impact and encouraged patient to discontinue use   Halina Maidens, MD [facility]  01/22/2016

## 2016-02-12 ENCOUNTER — Ambulatory Visit: Payer: Self-pay | Admitting: Family Medicine

## 2016-02-29 ENCOUNTER — Emergency Department: Payer: Medicare PPO

## 2016-02-29 ENCOUNTER — Encounter: Payer: Self-pay | Admitting: Emergency Medicine

## 2016-02-29 ENCOUNTER — Inpatient Hospital Stay
Admission: EM | Admit: 2016-02-29 | Discharge: 2016-03-03 | DRG: 897 | Disposition: A | Discharge status: Home / Self Care | Payer: Medicare PPO | Attending: Internal Medicine | Admitting: Internal Medicine

## 2016-02-29 ENCOUNTER — Inpatient Hospital Stay: Payer: Medicare PPO

## 2016-02-29 DIAGNOSIS — J440 Chronic obstructive pulmonary disease with acute lower respiratory infection: Secondary | ICD-10-CM | POA: Diagnosis present

## 2016-02-29 DIAGNOSIS — Z87891 Personal history of nicotine dependence: Secondary | ICD-10-CM

## 2016-02-29 DIAGNOSIS — E119 Type 2 diabetes mellitus without complications: Secondary | ICD-10-CM | POA: Diagnosis present

## 2016-02-29 DIAGNOSIS — J209 Acute bronchitis, unspecified: Secondary | ICD-10-CM | POA: Diagnosis present

## 2016-02-29 DIAGNOSIS — R45851 Suicidal ideations: Secondary | ICD-10-CM | POA: Diagnosis present

## 2016-02-29 DIAGNOSIS — I5032 Chronic diastolic (congestive) heart failure: Secondary | ICD-10-CM | POA: Diagnosis present

## 2016-02-29 DIAGNOSIS — E876 Hypokalemia: Secondary | ICD-10-CM | POA: Diagnosis present

## 2016-02-29 DIAGNOSIS — G4733 Obstructive sleep apnea (adult) (pediatric): Secondary | ICD-10-CM | POA: Diagnosis present

## 2016-02-29 DIAGNOSIS — Z7984 Long term (current) use of oral hypoglycemic drugs: Secondary | ICD-10-CM

## 2016-02-29 DIAGNOSIS — F321 Major depressive disorder, single episode, moderate: Secondary | ICD-10-CM | POA: Diagnosis present

## 2016-02-29 DIAGNOSIS — Z7951 Long term (current) use of inhaled steroids: Secondary | ICD-10-CM

## 2016-02-29 DIAGNOSIS — I110 Hypertensive heart disease with heart failure: Secondary | ICD-10-CM | POA: Diagnosis present

## 2016-02-29 DIAGNOSIS — F10239 Alcohol dependence with withdrawal, unspecified: Secondary | ICD-10-CM

## 2016-02-29 DIAGNOSIS — I10 Essential (primary) hypertension: Secondary | ICD-10-CM

## 2016-02-29 DIAGNOSIS — J441 Chronic obstructive pulmonary disease with (acute) exacerbation: Secondary | ICD-10-CM | POA: Diagnosis present

## 2016-02-29 DIAGNOSIS — Z825 Family history of asthma and other chronic lower respiratory diseases: Secondary | ICD-10-CM | POA: Diagnosis not present

## 2016-02-29 DIAGNOSIS — F1994 Other psychoactive substance use, unspecified with psychoactive substance-induced mood disorder: Secondary | ICD-10-CM | POA: Diagnosis present

## 2016-02-29 DIAGNOSIS — F10230 Alcohol dependence with withdrawal, uncomplicated: Secondary | ICD-10-CM | POA: Diagnosis not present

## 2016-02-29 DIAGNOSIS — F10939 Alcohol use, unspecified with withdrawal, unspecified: Secondary | ICD-10-CM

## 2016-02-29 DIAGNOSIS — Z6825 Body mass index (BMI) 25.0-25.9, adult: Secondary | ICD-10-CM | POA: Diagnosis not present

## 2016-02-29 DIAGNOSIS — F10930 Alcohol use, unspecified with withdrawal, uncomplicated: Secondary | ICD-10-CM

## 2016-02-29 DIAGNOSIS — F1010 Alcohol abuse, uncomplicated: Secondary | ICD-10-CM | POA: Diagnosis present

## 2016-02-29 HISTORY — DX: Type 2 diabetes mellitus without complications: E11.9

## 2016-02-29 LAB — CBC WITH DIFFERENTIAL/PLATELET
Basophils Absolute: 0 10*3/uL (ref 0–0.1)
Basophils Relative: 1 %
Eosinophils Absolute: 0.1 10*3/uL (ref 0–0.7)
Eosinophils Relative: 2 %
HCT: 47.7 % (ref 40.0–52.0)
Hemoglobin: 16.2 g/dL (ref 13.0–18.0)
Lymphocytes Relative: 20 %
Lymphs Abs: 1.1 10*3/uL (ref 1.0–3.6)
MCH: 32 pg (ref 26.0–34.0)
MCHC: 33.9 g/dL (ref 32.0–36.0)
MCV: 94.4 fL (ref 80.0–100.0)
Monocytes Absolute: 0.6 10*3/uL (ref 0.2–1.0)
Monocytes Relative: 11 %
Neutro Abs: 3.6 10*3/uL (ref 1.4–6.5)
Neutrophils Relative %: 66 %
Platelets: 127 10*3/uL — ABNORMAL LOW (ref 150–440)
RBC: 5.05 MIL/uL (ref 4.40–5.90)
RDW: 16.6 % — ABNORMAL HIGH (ref 11.5–14.5)
WBC: 5.5 10*3/uL (ref 3.8–10.6)

## 2016-02-29 LAB — COMPREHENSIVE METABOLIC PANEL
ALT: 35 U/L (ref 17–63)
AST: 67 U/L — ABNORMAL HIGH (ref 15–41)
Albumin: 3.8 g/dL (ref 3.5–5.0)
Alkaline Phosphatase: 145 U/L — ABNORMAL HIGH (ref 38–126)
Anion gap: 10 (ref 5–15)
BUN: 9 mg/dL (ref 6–20)
CO2: 27 mmol/L (ref 22–32)
Calcium: 9.2 mg/dL (ref 8.9–10.3)
Chloride: 99 mmol/L — ABNORMAL LOW (ref 101–111)
Creatinine, Ser: 0.88 mg/dL (ref 0.61–1.24)
GFR calc Af Amer: >60 mL/min (ref >60)
GFR calc non Af Amer: >60 mL/min (ref >60)
Glucose, Bld: 123 mg/dL — ABNORMAL HIGH (ref 65–99)
Potassium: 3.2 mmol/L — ABNORMAL LOW (ref 3.5–5.1)
Sodium: 136 mmol/L (ref 135–145)
Total Bilirubin: 0.8 mg/dL (ref 0.3–1.2)
Total Protein: 8 g/dL (ref 6.5–8.1)

## 2016-02-29 LAB — LACTIC ACID, PLASMA: Lactic Acid, Venous: 1.7 mmol/L (ref 0.5–1.9)

## 2016-02-29 LAB — MAGNESIUM: Magnesium: 1.4 mg/dL — ABNORMAL LOW (ref 1.7–2.4)

## 2016-02-29 LAB — GLUCOSE, CAPILLARY: Glucose-Capillary: 134 mg/dL — ABNORMAL HIGH (ref 65–99)

## 2016-02-29 LAB — LIPASE, BLOOD: Lipase: 18 U/L (ref 11–51)

## 2016-02-29 MED ORDER — LACTASE 9000 UNITS PO TABS: ORAL_TABLET | Freq: Two times a day (BID) | ORAL | Status: DC

## 2016-02-29 MED ORDER — HYDRALAZINE HCL 20 MG/ML IJ SOLN: 10.0000 mg | Freq: Once | INTRAMUSCULAR | Status: DC

## 2016-02-29 MED ORDER — POTASSIUM CHLORIDE CRYS ER 20 MEQ PO TBCR
20.0000 meq | EXTENDED_RELEASE_TABLET | Freq: Two times a day (BID) | ORAL | Status: DC
  Administered 2016-03-01 – 2016-03-03 (×5): 20 meq via ORAL
  Filled 2016-02-29 (×5): qty 1

## 2016-02-29 MED ORDER — METFORMIN HCL 500 MG PO TABS
500.0000 mg | ORAL_TABLET | Freq: Two times a day (BID) | ORAL | Status: DC
  Administered 2016-03-01 – 2016-03-03 (×6): 500 mg via ORAL
  Filled 2016-02-29 (×6): qty 1

## 2016-02-29 MED ORDER — THIAMINE HCL 100 MG/ML IJ SOLN
100.0000 mg | Freq: Every day | INTRAMUSCULAR | Status: DC
  Administered 2016-03-03: 100 mg via INTRAVENOUS
  Filled 2016-02-29: qty 2

## 2016-02-29 MED ORDER — THIAMINE HCL 100 MG/ML IJ SOLN
Freq: Once | INTRAVENOUS | Status: AC
  Administered 2016-02-29: 17:00:00 via INTRAVENOUS
  Filled 2016-02-29: qty 1000

## 2016-02-29 MED ORDER — CARVEDILOL 6.25 MG PO TABS
6.2500 mg | ORAL_TABLET | Freq: Two times a day (BID) | ORAL | Status: DC
  Administered 2016-03-01 – 2016-03-03 (×6): 6.25 mg via ORAL
  Filled 2016-02-29 (×6): qty 1

## 2016-02-29 MED ORDER — HYDRALAZINE HCL 20 MG/ML IJ SOLN
10.0000 mg | INTRAMUSCULAR | Status: DC | PRN
  Administered 2016-02-29 – 2016-03-01 (×3): 10 mg via INTRAVENOUS
  Filled 2016-02-29 (×2): qty 1

## 2016-02-29 MED ORDER — ADULT MULTIVITAMIN W/MINERALS CH
1.0000 | ORAL_TABLET | Freq: Every day | ORAL | Status: DC
  Administered 2016-03-01 – 2016-03-02 (×2): 1 via ORAL
  Filled 2016-02-29 (×2): qty 1

## 2016-02-29 MED ORDER — LORAZEPAM 1 MG PO TABS: 2.0000 mg | ORAL_TABLET | ORAL | Status: DC

## 2016-02-29 MED ORDER — LORAZEPAM 2 MG/ML IJ SOLN: 0.0000 mg | Freq: Two times a day (BID) | INTRAMUSCULAR | Status: DC

## 2016-02-29 MED ORDER — LORAZEPAM 2 MG/ML IJ SOLN
1.0000 mg | INTRAMUSCULAR | Status: DC | PRN
Start: 2016-02-29 — End: 2016-02-29

## 2016-02-29 MED ORDER — HEPARIN SODIUM (PORCINE) 5000 UNIT/ML IJ SOLN
5000.0000 [IU] | Freq: Three times a day (TID) | INTRAMUSCULAR | Status: DC
  Administered 2016-02-29 – 2016-03-03 (×9): 5000 [IU] via SUBCUTANEOUS
  Filled 2016-02-29 (×9): qty 1

## 2016-02-29 MED ORDER — THIAMINE HCL 100 MG/ML IJ SOLN
100.0000 mg | Freq: Every day | INTRAMUSCULAR | Status: DC
  Administered 2016-02-29: 100 mg via INTRAVENOUS
  Filled 2016-02-29: qty 2

## 2016-02-29 MED ORDER — LORAZEPAM 1 MG PO TABS: 1.0000 mg | ORAL_TABLET | Freq: Four times a day (QID) | ORAL | Status: DC | PRN

## 2016-02-29 MED ORDER — IOPAMIDOL (ISOVUE-370) INJECTION 76%
75.0000 mL | Freq: Once | INTRAVENOUS | Status: AC | PRN
  Administered 2016-02-29: 75 mL via INTRAVENOUS

## 2016-02-29 MED ORDER — THIAMINE HCL 100 MG/ML IJ SOLN
INTRAVENOUS | Status: DC
  Administered 2016-02-29 – 2016-03-01 (×4): via INTRAVENOUS
  Filled 2016-02-29 (×11): qty 1000

## 2016-02-29 MED ORDER — VITAMIN D 1000 UNITS PO TABS
1000.0000 [IU] | ORAL_TABLET | Freq: Every day | ORAL | Status: DC
  Administered 2016-02-29 – 2016-03-03 (×4): 1000 [IU] via ORAL
  Filled 2016-02-29 (×4): qty 1

## 2016-02-29 MED ORDER — MAGNESIUM SULFATE 2 GM/50ML IV SOLN
2.0000 g | Freq: Once | INTRAVENOUS | Status: DC
  Filled 2016-02-29: qty 50

## 2016-02-29 MED ORDER — FLUTICASONE FUROATE-VILANTEROL 200-25 MCG/INH IN AEPB
1.0000 | INHALATION_SPRAY | Freq: Every day | RESPIRATORY_TRACT | Status: DC
Start: 2016-02-29 — End: 2016-03-04
  Administered 2016-02-29 – 2016-03-03 (×4): 1 via RESPIRATORY_TRACT
  Filled 2016-02-29: qty 28

## 2016-02-29 MED ORDER — FOLIC ACID 1 MG PO TABS
1.0000 mg | ORAL_TABLET | Freq: Every day | ORAL | Status: DC
  Administered 2016-03-01 – 2016-03-02 (×2): 1 mg via ORAL
  Filled 2016-02-29 (×2): qty 1

## 2016-02-29 MED ORDER — PANTOPRAZOLE SODIUM 40 MG PO TBEC
40.0000 mg | DELAYED_RELEASE_TABLET | Freq: Every day | ORAL | Status: DC
  Administered 2016-02-29 – 2016-03-03 (×4): 40 mg via ORAL
  Filled 2016-02-29 (×4): qty 1

## 2016-02-29 MED ORDER — ALBUTEROL SULFATE (2.5 MG/3ML) 0.083% IN NEBU: 2.5000 mg | INHALATION_SOLUTION | RESPIRATORY_TRACT | Status: DC | PRN

## 2016-02-29 MED ORDER — LISINOPRIL 20 MG PO TABS
40.0000 mg | ORAL_TABLET | Freq: Every day | ORAL | Status: DC
  Administered 2016-03-01 – 2016-03-03 (×3): 40 mg via ORAL
  Filled 2016-02-29 (×3): qty 2

## 2016-02-29 MED ORDER — HYDRALAZINE HCL 20 MG/ML IJ SOLN
INTRAMUSCULAR | Status: AC
  Administered 2016-02-29: 10 mg via INTRAVENOUS
  Filled 2016-02-29: qty 1

## 2016-02-29 MED ORDER — LORAZEPAM 2 MG/ML IJ SOLN
0.0000 mg | Freq: Four times a day (QID) | INTRAMUSCULAR | Status: DC
  Administered 2016-03-01: 1 mg via INTRAVENOUS
  Administered 2016-03-01: 0.5 mg via INTRAVENOUS
  Filled 2016-02-29 (×2): qty 1

## 2016-02-29 MED ORDER — VITAMIN B-1 100 MG PO TABS
100.0000 mg | ORAL_TABLET | Freq: Every day | ORAL | Status: DC
  Administered 2016-03-01 – 2016-03-02 (×2): 100 mg via ORAL
  Filled 2016-02-29 (×2): qty 1

## 2016-02-29 MED ORDER — LORAZEPAM 2 MG/ML IJ SOLN
1.0000 mg | Freq: Once | INTRAMUSCULAR | Status: AC
  Administered 2016-02-29: 1 mg via INTRAVENOUS
  Filled 2016-02-29: qty 1

## 2016-02-29 MED ORDER — FUROSEMIDE 40 MG PO TABS
40.0000 mg | ORAL_TABLET | Freq: Every day | ORAL | Status: DC
  Administered 2016-03-01 – 2016-03-03 (×3): 40 mg via ORAL
  Filled 2016-02-29 (×3): qty 1

## 2016-02-29 NOTE — ED Triage Notes (Signed)
Pt presents to ED c/o tremors x2 months that have worsened; balance issues.

## 2016-02-29 NOTE — ED Provider Notes (Addendum)
[facility] Emergency Department Provider Note   ____________________________________________   None    (approximate)  I have reviewed the triage vital signs and the nursing notes.   HISTORY  Chief Complaint Tremors   HPI Daniel Frye is a 65 y.o. male patient reports as nurse's note finds been shaky for several months worse for the last 2 days. He drinks he says "a lot" of alcohol every day last drink was about 2 days ago he is getting more shaky over the last 2 days. He has difficulty walking. His unstable when he walks. This is been getting worse. Patient reports she is also having shaking chills and is coughing up green phlegm. He is not any more short of breath than usual with his COPD.   Past Medical History:  Diagnosis Date  . CHF (congestive heart failure) ([place])   . COPD (chronic obstructive pulmonary disease) ([place])   . Depression   . Hypertension   . Rheumatoid arteritis 1989   Took shots for awhile but not now  . Substance abuse     Patient Active Problem List   Diagnosis Date Noted  . Crack cocaine use 01/22/2016  . Obstructive sleep apnea of adult 03/30/2015  . Chronic diastolic heart failure ([place]) 03/30/2015  . Edema of foot 03/30/2015  . Alcohol abuse 03/23/2015  . Noncompliance 03/23/2015  . Vitamin D deficiency 01/27/2015  . Diabetes mellitus type 2, controlled, without complications ([place]) 99991111  . Obesity (BMI 30.0-34.9) 11/26/2014  . COPD (chronic obstructive pulmonary disease) ([place]) 06/22/2014  . Essential (primary) hypertension 06/22/2014  . Arthritis or polyarthritis, rheumatoid ([place]) 06/22/2014    Past Surgical History:  Procedure Laterality Date  . heart surgery to drain fluid N/A 1987   Heart surgery to remove fluid     Prior to Admission medications   Medication Sig Start Date End Date Taking? Authorizing Provider  albuterol (PROVENTIL) (2.5 MG/3ML) 0.083% nebulizer solution Take 3 mLs (2.5 mg total) by  nebulization every 4 (four) hours as needed for wheezing or shortness of breath. 07/14/15 07/13/16  [name], MD  albuterol (VENTOLIN HFA) 108 (90 Base) MCG/ACT inhaler Inhale 2 puffs into the lungs every 4 (four) hours as needed for wheezing or shortness of breath. 09/25/15   Glean Hess, MD  carvedilol (COREG) 6.25 MG tablet Take 1 tablet (6.25 mg total) by mouth 2 (two) times daily with a meal. 01/22/16   Glean Hess, MD  Cholecalciferol (VITAMIN D) 2000 UNITS CAPS Take 1 capsule (2,000 Units total) by mouth daily. Patient not taking: Reported on 01/22/2016 01/27/15   [name], MD  fluticasone furoate-vilanterol (BREO ELLIPTA) 200-25 MCG/INH AEPB Inhale 1 puff into the lungs daily. 09/25/15   Glean Hess, MD  furosemide (LASIX) 40 MG tablet Take 1 tablet (40 mg total) by mouth daily. 01/22/16   Glean Hess, MD  Lactase (DAIRY DIGESTIVE SUPPLEMENT PO) Take 1 tablet by mouth 2 (two) times daily. Reported on 07/27/2015    Historical Provider, MD  lisinopril (PRINIVIL,ZESTRIL) 40 MG tablet Take 1 tablet by mouth daily at 2 PM. Reported on 03/19/2015 08/20/14   Historical Provider, MD  metFORMIN (GLUCOPHAGE) 500 MG tablet Take 1 tablet (500 mg total) by mouth 2 (two) times daily with a meal. Patient not taking: Reported on 01/22/2016 01/27/15   [name], MD    Allergies Patient has no known allergies.  History reviewed. No pertinent family history.  Social History Social History  Substance Use Topics  .  Smoking status: Former Smoker    Packs/day: 0.33    Years: 50.00    Types: Cigarettes    Quit date: 05/06/2014  . Smokeless tobacco: Never Used  . Alcohol use 9.0 oz/week    15 Glasses of wine per week     Comment: Drinks 1/5 day of wine     Review of Systems Constitutional: No fever/chills Eyes: No visual changes. ENT: No sore throat. Cardiovascular: Denies chest pain. Respiratory: Denies shortness of breath. Gastrointestinal: No abdominal pain.  No nausea,  no vomiting.  No diarrhea.  No constipation. Genitourinary: Negative for dysuria. Musculoskeletal: Negative for back pain. Skin: Negative for rash. Neurological: Negative for headaches, focal weakness or numbness.  10-point ROS otherwise negative.  ____________________________________________   PHYSICAL EXAM:  VITAL SIGNS: ED Triage Vitals  Enc Vitals Group     BP 02/29/16 1429 124/88     Pulse Rate 02/29/16 1429 (!) 102     Resp 02/29/16 1429 (!) 21     Temp 02/29/16 1429 97.4 F (36.3 C)     Temp Source 02/29/16 1429 Oral     SpO2 02/29/16 1429 98 %     Weight 02/29/16 1423 175 lb (79.4 kg)     Height 02/29/16 1423 5\' 9"  (1.753 m)     Head Circumference --      Peak Flow --      Pain Score --      Pain Loc --      Pain Edu? --      Excl. in [place]? --     Constitutional: Alert and oriented. Well appearing and in no acute distress. Eyes: Conjunctivae are normal. PERRL. EOMI. Head: Atraumatic. Nose: No congestion/rhinnorhea. Mouth/Throat: Mucous membranes are moist.  Oropharynx non-erythematous. Neck: No stridor.  Cardiovascular: Normal rate, regular rhythm. Grossly normal heart sounds.  Good peripheral circulation. Respiratory: Normal respiratory effort.  No retractions. Lungs CTAB. Gastrointestinal: Soft and nontender. No distention. No abdominal bruits. No CVA tenderness. Musculoskeletal: No lower extremity tenderness patient has bilateral edema worse on the right patient reports is   No joint effusions. Neurologic:  Normal speech and language.ranial nerves II through XII are intact cerebellar finger to nose is somewhat ataxic. Motor strength is 5 over 5 throughout. Patient does not come in when asked if he is numb anywhere. Sensation appears to be intact throughout. Skin:  Skin is warm, dry and intact. No rash noted.   ____________________________________________   LABS (all labs ordered are listed, but only abnormal results are displayed)  Labs Reviewed  GLUCOSE,  CAPILLARY - Abnormal; Notable for the following:       Result Value   Glucose-Capillary 134 (*)    All other components within normal limits  COMPREHENSIVE METABOLIC PANEL - Abnormal; Notable for the following:    Potassium 3.2 (*)    Chloride 99 (*)    Glucose, Bld 123 (*)    AST 67 (*)    Alkaline Phosphatase 145 (*)    All other components within normal limits  CBC WITH DIFFERENTIAL/PLATELET - Abnormal; Notable for the following:    RDW 16.6 (*)    Platelets 127 (*)    All other components within normal limits  MAGNESIUM - Abnormal; Notable for the following:    Magnesium 1.4 (*)    All other components within normal limits  LIPASE, BLOOD  LACTIC ACID, PLASMA  LACTIC ACID, PLASMA  URINALYSIS, COMPLETE (UACMP) WITH MICROSCOPIC   ____________________________________________  EKG EKG normal sinus rhythm at 96  right axis nonspecific ST-T wave changes there is an S1 every 3 T3 on the EKG this couple with the patient is somewhat tachycardic as a cough will  ____________________________________________  RADIOLOGY Study Result   CLINICAL DATA:  Tremors for 2 months, worsening.  Balance problems.  EXAM: CT HEAD WITHOUT CONTRAST  TECHNIQUE: Contiguous axial images were obtained from the base of the skull through the vertex without intravenous contrast.  COMPARISON:  None.  FINDINGS: Brain: The brainstem, cerebellum, cerebral peduncles, thalami, basal ganglia, basilar cisterns, and ventricular system appear within normal limits. Periventricular white matter and corona radiata hypodensities favor chronic ischemic microvascular white matter disease.  No findings of acute CVA. A 0.8 cm dense extra-axial lesion along the upper margin of the left temporal lobe on image 35/4 is highly likely to be a benign meningioma, I am very skeptical of this being a focal hematoma given the small size and confined appearance.  Vascular: There is atherosclerotic calcification of the  cavernous carotid arteries bilaterally.  Skull: Unremarkable  Sinuses/Orbits: Chronic ethmoid sinusitis.  Other: No supplemental non-categorized findings.  IMPRESSION: 1. Small (0.8 cm) focal extra-axial hyperdense lesion along the upper margin of the left temporal lobe. Given the confined location and overall appearance (basket best assessed on the coronal images) I believe that this probably represents a meningioma. 2. Periventricular white matter and corona radiata hypodensities favor chronic ischemic microvascular white matter disease. 3. Atherosclerosis 4. Chronic ethmoid sinusitis.     Study Result   CLINICAL DATA:  Tremors for the past 2 months, worsened over the past 2 days.  EXAM: CHEST  2 VIEW  COMPARISON:  None.  FINDINGS: Lung volumes are low but the lungs are clear. Heart size is normal. No pneumothorax or pleural effusion. No acute bony abnormality.  IMPRESSION: No acute disease.   Electronically Signed   By: Inge Rise M.D.   On: 02/29/2016 15:44     ____________________________________________   PROCEDURES  Procedure(s) performed:   Procedures  Critical Care performed:  ____________________________________________   INITIAL IMPRESSION / ASSESSMENT AND PLAN / ED COURSE  Pertinent labs & imaging results that were available during my care of the patient were reviewed by me and considered in my medical decision making (see chart for details).    Clinical Course    atient much less shaky after Ativan  ____________________________________________   FINAL CLINICAL IMPRESSION(S) / ED DIAGNOSES  Final diagnoses:  None      NEW MEDICATIONS STARTED DURING THIS VISIT:  New Prescriptions   No medications on file     Note:  This document was prepared using Dragon voice recognition software and may include unintentional dictation errors.    Nena Polio, MD 02/29/16 1731    Nena Polio, MD 02/29/16  [phone]

## 2016-02-29 NOTE — H&P (Signed)
Daniel Frye NAME: Daniel Frye    MR#:  [id number]  DATE OF BIRTH:  [birth year]  DATE OF ADMISSION:  02/29/2016  PRIMARY CARE PHYSICIAN: [name], MD   REQUESTING/REFERRING PHYSICIAN: malinda  CHIEF COMPLAINT:   Chief Complaint  Patient presents with  . Tremors    HISTORY OF PRESENT ILLNESS: Daniel Frye  is a 65 y.o. male with a known history of CHF, COPD, Htn, RA, Chronic alcoholism- did not drink for last 2 days- having generalized shaking and falling so came to ER. In alcohol withdrawal. Denies any fever- had some nausea.  PAST MEDICAL HISTORY:   Past Medical History:  Diagnosis Date  . CHF (congestive heart failure) ([place])   . COPD (chronic obstructive pulmonary disease) ([place])   . Depression   . Hypertension   . Rheumatoid arteritis 1989   Took shots for awhile but not now  . Substance abuse     PAST SURGICAL HISTORY: Past Surgical History:  Procedure Laterality Date  . heart surgery to drain fluid N/A 1987   Heart surgery to remove fluid     SOCIAL HISTORY:  Social History  Substance Use Topics  . Smoking status: Former Smoker    Packs/day: 0.33    Years: 50.00    Types: Cigarettes    Quit date: 05/06/2014  . Smokeless tobacco: Never Used  . Alcohol use 9.0 oz/week    15 Glasses of wine per week     Comment: Drinks 1/5 day of wine     FAMILY HISTORY:  Family History  Problem Relation Age of Onset  . COPD Brother     DRUG ALLERGIES: No Known Allergies  REVIEW OF SYSTEMS:   CONSTITUTIONAL: No fever, fatigue or weakness.  EYES: No blurred or double vision.  EARS, NOSE, AND THROAT: No tinnitus or ear pain.  RESPIRATORY: No cough, shortness of breath, wheezing or hemoptysis.  CARDIOVASCULAR: No chest pain, orthopnea, edema.  GASTROINTESTINAL: No nausea, vomiting, diarrhea or abdominal pain.  GENITOURINARY: No dysuria, hematuria.  ENDOCRINE: No polyuria, nocturia,  HEMATOLOGY: No anemia, easy  bruising or bleeding SKIN: No rash or lesion. MUSCULOSKELETAL: No joint pain or arthritis.  Generalized shaking. NEUROLOGIC: No tingling, numbness, weakness.  PSYCHIATRY: No anxiety or depression.   MEDICATIONS AT HOME:  Prior to Admission medications   Medication Sig Start Date End Date Taking? Authorizing Provider  albuterol (PROVENTIL) (2.5 MG/3ML) 0.083% nebulizer solution Take 3 mLs (2.5 mg total) by nebulization every 4 (four) hours as needed for wheezing or shortness of breath. 07/14/15 07/13/16  [name], MD  albuterol (VENTOLIN HFA) 108 (90 Base) MCG/ACT inhaler Inhale 2 puffs into the lungs every 4 (four) hours as needed for wheezing or shortness of breath. 09/25/15   Glean Hess, MD  carvedilol (COREG) 6.25 MG tablet Take 1 tablet (6.25 mg total) by mouth 2 (two) times daily with a meal. 01/22/16   Glean Hess, MD  Cholecalciferol (VITAMIN D) 2000 UNITS CAPS Take 1 capsule (2,000 Units total) by mouth daily. Patient not taking: Reported on 01/22/2016 01/27/15   [name], MD  fluticasone furoate-vilanterol (BREO ELLIPTA) 200-25 MCG/INH AEPB Inhale 1 puff into the lungs daily. 09/25/15   Glean Hess, MD  furosemide (LASIX) 40 MG tablet Take 1 tablet (40 mg total) by mouth daily. 01/22/16   Glean Hess, MD  Lactase (DAIRY DIGESTIVE SUPPLEMENT PO) Take 1 tablet by mouth 2 (two) times daily. Reported on 07/27/2015  Historical Provider, MD  lisinopril (PRINIVIL,ZESTRIL) 40 MG tablet Take 1 tablet by mouth daily at 2 PM. Reported on 03/19/2015 08/20/14   Historical Provider, MD  metFORMIN (GLUCOPHAGE) 500 MG tablet Take 1 tablet (500 mg total) by mouth 2 (two) times daily with a meal. Patient not taking: Reported on 01/22/2016 01/27/15   [name], MD      PHYSICAL EXAMINATION:   VITAL SIGNS: Blood pressure (!) 175/103, pulse 98, temperature 97.4 F (36.3 C), temperature source Oral, resp. rate 18, height 5\' 9"  (1.753 m), weight 79.4 kg (175 lb), SpO2 94  %.  GENERAL:  65 y.o.-year-old patient lying in the bed with no acute distress.  EYES: Pupils equal, round, reactive to light and accommodation. No scleral icterus. Extraocular muscles intact.  HEENT: Head atraumatic, normocephalic. Oropharynx and nasopharynx clear.  NECK:  Supple, no jugular venous distention. No thyroid enlargement, no tenderness.  LUNGS: Normal breath sounds bilaterally, no wheezing, rales,rhonchi or crepitation. No use of accessory muscles of respiration.  CARDIOVASCULAR: S1, S2 normal. No murmurs, rubs, or gallops.  ABDOMEN: Soft, nontender, nondistended. Bowel sounds present. No organomegaly or mass.  EXTREMITIES: No pedal edema, cyanosis, or clubbing.  NEUROLOGIC: Cranial nerves II through XII are intact. Muscle strength 5/5 in all extremities. Sensation intact. Gait not checked. Generalized shaking on all limbs. PSYCHIATRIC: The patient is alert and oriented x 3.  SKIN: No obvious rash, lesion, or ulcer.   LABORATORY PANEL:   CBC  Recent Labs Lab 02/29/16 1437  WBC 5.5  HGB 16.2  HCT 47.7  PLT 127*  MCV 94.4  MCH 32.0  MCHC 33.9  RDW 16.6*  LYMPHSABS 1.1  MONOABS 0.6  EOSABS 0.1  BASOSABS 0.0   ------------------------------------------------------------------------------------------------------------------  Chemistries   Recent Labs Lab 02/29/16 1437 02/29/16 1439  NA 136  --   K 3.2*  --   CL 99*  --   CO2 27  --   GLUCOSE 123*  --   BUN 9  --   CREATININE 0.88  --   CALCIUM 9.2  --   MG  --  1.4*  AST 67*  --   ALT 35  --   ALKPHOS 145*  --   BILITOT 0.8  --    ------------------------------------------------------------------------------------------------------------------ estimated creatinine clearance is 81.5 mL/min (by C-G formula based on SCr of 0.88 mg/dL). ------------------------------------------------------------------------------------------------------------------ No results for input(s): TSH, T4TOTAL, T3FREE,  THYROIDAB in the last 72 hours.  Invalid input(s): FREET3   Coagulation profile No results for input(s): INR, PROTIME in the last 168 hours. ------------------------------------------------------------------------------------------------------------------- No results for input(s): DDIMER in the last 72 hours. -------------------------------------------------------------------------------------------------------------------  Cardiac Enzymes No results for input(s): CKMB, TROPONINI, MYOGLOBIN in the last 168 hours.  Invalid input(s): CK ------------------------------------------------------------------------------------------------------------------ Invalid input(s): POCBNP  ---------------------------------------------------------------------------------------------------------------  Urinalysis No results found for: COLORURINE, APPEARANCEUR, LABSPEC, PHURINE, GLUCOSEU, HGBUR, BILIRUBINUR, KETONESUR, PROTEINUR, UROBILINOGEN, NITRITE, LEUKOCYTESUR   RADIOLOGY: Dg Chest 2 View  Result Date: 02/29/2016 CLINICAL DATA:  Tremors for the past 2 months, worsened over the past 2 days. EXAM: CHEST  2 VIEW COMPARISON:  None. FINDINGS: Lung volumes are low but the lungs are clear. Heart size is normal. No pneumothorax or pleural effusion. No acute bony abnormality. IMPRESSION: No acute disease. Electronically Signed   By: Inge Rise M.D.   On: 02/29/2016 15:44   Ct Head Wo Contrast  Result Date: 02/29/2016 CLINICAL DATA:  Tremors for 2 months, worsening.  Balance problems. EXAM: CT HEAD WITHOUT CONTRAST TECHNIQUE: Contiguous axial images were obtained from  the base of the skull through the vertex without intravenous contrast. COMPARISON:  None. FINDINGS: Brain: The brainstem, cerebellum, cerebral peduncles, thalami, basal ganglia, basilar cisterns, and ventricular system appear within normal limits. Periventricular white matter and corona radiata hypodensities favor chronic ischemic  microvascular white matter disease. No findings of acute CVA. A 0.8 cm dense extra-axial lesion along the upper margin of the left temporal lobe on image 35/4 is highly likely to be a benign meningioma, I am very skeptical of this being a focal hematoma given the small size and confined appearance. Vascular: There is atherosclerotic calcification of the cavernous carotid arteries bilaterally. Skull: Unremarkable Sinuses/Orbits: Chronic ethmoid sinusitis. Other: No supplemental non-categorized findings. IMPRESSION: 1. Small (0.8 cm) focal extra-axial hyperdense lesion along the upper margin of the left temporal lobe. Given the confined location and overall appearance (basket best assessed on the coronal images) I believe that this probably represents a meningioma. 2. Periventricular white matter and corona radiata hypodensities favor chronic ischemic microvascular white matter disease. 3. Atherosclerosis 4. Chronic ethmoid sinusitis. Electronically Signed   By: Van Clines M.D.   On: 02/29/2016 15:44    EKG: Orders placed or performed during the hospital encounter of 02/29/16  . ED EKG  . ED EKG  . EKG 12-Lead  . EKG 12-Lead    IMPRESSION AND PLAN:  * Alcohol withdrawal.   IV and PO ativan   CIWA protocol.   Thiamin and folic acid suppelement.  * Hypokalemia   Hypomagnesemia       Replace, recheck.  * hypertension   Cont home emds  * COPD   Cont inhalers, no exacerbation.  * Active smoking   [place] to quit for 4 min, offered nicotin patch.   All the records are reviewed and case discussed with ED provider. Management plans discussed with the patient, family and they are in agreement.  CODE STATUS: full. Code Status History    This patient does not have a recorded code status. Please follow your organizational policy for patients in this situation.       TOTAL TIME TAKING CARE OF THIS PATIENT: 50 minutes.    Vaughan Basta M.D on 02/29/2016   Between  7am to 6pm - Pager - [phone]  After 6pm go to [email or web address] - password [email or web address] [facility]  Office  [phone]  CC: Primary care physician; [name], MD   Note: This dictation was prepared with Dragon dictation along with smaller phrase technology. Any transcriptional errors that result from this process are unintentional.

## 2016-02-29 NOTE — ED Notes (Signed)
Patient transported to CT 

## 2016-02-29 NOTE — Progress Notes (Signed)
PHARMACIST - PHYSICIAN ORDER COMMUNICATION  CONCERNING: P&T Medication Policy on Herbal Medications  DESCRIPTION:  This patient's order for:  Lactase  has been noted.  This product(s) is classified as an "herbal" or natural product. Due to a lack of definitive safety studies or FDA approval, nonstandard manufacturing practices, plus the potential risk of unknown drug-drug interactions while on inpatient medications, the Pharmacy and Therapeutics Committee does not permit the use of "herbal" or natural products of this type within [place].   ACTION TAKEN: The pharmacy department is unable to verify this order at this time and your patient has been informed of this safety policy. Please reevaluate patient's clinical condition at discharge and address if the herbal or natural product(s) should be resumed at that time.   

## 2016-02-29 NOTE — ED Notes (Signed)
Pt states feeling shaky for several months but worsening 2 days ago, states he drinks ETOH daily, last drink was 2 days ago, pt tremoring upon assessment

## 2016-02-29 NOTE — Progress Notes (Signed)
Spoke with ED Dr. Regarding pt. States pt. Primarily ETOH seeking help for S.A. This writer attempted several times per ED Dr. Ailene Ravel to wake pt., but no response. TTS will have to be done at a later time when pt. Is alert. Currently pt. Is sedated. Rudi Knippenberg K. Nash Shearer, LPC-A, [facility]  Counselor 02/29/2016 5:08 PM

## 2016-02-29 NOTE — ED Notes (Signed)
MD at bedside. 

## 2016-03-01 LAB — CBC
HCT: 43.6 % (ref 40.0–52.0)
Hemoglobin: 15.1 g/dL (ref 13.0–18.0)
MCH: 32 pg (ref 26.0–34.0)
MCHC: 34.5 g/dL (ref 32.0–36.0)
MCV: 92.7 fL (ref 80.0–100.0)
Platelets: 105 10*3/uL — ABNORMAL LOW (ref 150–440)
RBC: 4.7 MIL/uL (ref 4.40–5.90)
RDW: 16.4 % — ABNORMAL HIGH (ref 11.5–14.5)
WBC: 5.8 10*3/uL (ref 3.8–10.6)

## 2016-03-01 LAB — BASIC METABOLIC PANEL
Anion gap: 10 (ref 5–15)
BUN: 7 mg/dL (ref 6–20)
CO2: 27 mmol/L (ref 22–32)
Calcium: 8.5 mg/dL — ABNORMAL LOW (ref 8.9–10.3)
Chloride: 100 mmol/L — ABNORMAL LOW (ref 101–111)
Creatinine, Ser: 0.81 mg/dL (ref 0.61–1.24)
GFR calc Af Amer: >60 mL/min (ref >60)
GFR calc non Af Amer: >60 mL/min (ref >60)
Glucose, Bld: 101 mg/dL — ABNORMAL HIGH (ref 65–99)
Potassium: 2.7 mmol/L — CL (ref 3.5–5.1)
Sodium: 137 mmol/L (ref 135–145)

## 2016-03-01 MED ORDER — ACETAMINOPHEN 325 MG PO TABS: 650.0000 mg | ORAL_TABLET | Freq: Four times a day (QID) | ORAL | Status: DC | PRN

## 2016-03-01 MED ORDER — POTASSIUM CHLORIDE CRYS ER 20 MEQ PO TBCR
40.0000 meq | EXTENDED_RELEASE_TABLET | Freq: Once | ORAL | Status: AC
  Administered 2016-03-01: 40 meq via ORAL
  Filled 2016-03-01: qty 2

## 2016-03-01 MED ORDER — OXYCODONE HCL 5 MG PO TABS
5.0000 mg | ORAL_TABLET | ORAL | Status: DC | PRN
  Administered 2016-03-01: 5 mg via ORAL
  Filled 2016-03-01: qty 1

## 2016-03-01 MED ORDER — BOOST / RESOURCE BREEZE PO LIQD
1.0000 | Freq: Three times a day (TID) | ORAL | Status: DC
  Administered 2016-03-01 – 2016-03-03 (×7): 1 via ORAL

## 2016-03-01 MED ORDER — MAGNESIUM SULFATE 2 GM/50ML IV SOLN
2.0000 g | Freq: Once | INTRAVENOUS | Status: AC
  Administered 2016-03-01: 2 g via INTRAVENOUS
  Filled 2016-03-01: qty 50

## 2016-03-01 NOTE — [facility] (Signed)
Daniel Frye at [place] NAME: Daniel Frye    MR#:  [id number]  DATE OF BIRTH:  15-Feb-1951  SUBJECTIVE:  CHIEF COMPLAINT:   Chief Complaint  Patient presents with  . Tremors   The patient feels better no tremor. Diarrhea 3 times today. No abdominal pain, nausea or vomiting. REVIEW OF SYSTEMS:  Review of Systems  Constitutional: Positive for malaise/fatigue. Negative for chills and fever.  HENT: Negative for congestion and nosebleeds.   Eyes: Negative for blurred vision and double vision.  Respiratory: Negative for cough, shortness of breath and stridor.   Cardiovascular: Negative for chest pain and leg swelling.  Gastrointestinal: Positive for diarrhea. Negative for abdominal pain, blood in stool, melena, nausea and vomiting.  Genitourinary: Negative for dysuria and hematuria.  Musculoskeletal: Negative for joint pain.  Skin: Negative for itching and rash.  Neurological: Negative for dizziness, focal weakness, loss of consciousness and weakness.  Psychiatric/Behavioral: Negative for depression. The patient is not nervous/anxious.     DRUG ALLERGIES:  No Known Allergies VITALS:  Blood pressure (!) 165/102, pulse 78, temperature 97.8 F (36.6 C), temperature source Oral, resp. rate 20, height 5\' 9"  (1.753 m), weight 175 lb (79.4 kg), SpO2 100 %. PHYSICAL EXAMINATION:  Physical Exam  Constitutional: He is oriented to person, place, and time and well-developed, well-nourished, and in no distress.  HENT:  Head: Normocephalic.  Mouth/Throat: Oropharynx is clear and moist.  Eyes: Conjunctivae and EOM are normal.  Neck: Normal range of motion. Neck supple. No JVD present. No tracheal deviation present. No thyromegaly present.  Cardiovascular: Normal rate, regular rhythm and normal heart sounds.  Exam reveals no gallop.   No murmur heard. Pulmonary/Chest: Effort normal and breath sounds normal. No respiratory distress. He has no wheezes. He  has no rales.  Abdominal: Soft. Bowel sounds are normal. He exhibits no distension. There is no tenderness.  Musculoskeletal: Normal range of motion. He exhibits no edema or tenderness.  Neurological: He is alert and oriented to person, place, and time. No cranial nerve deficit.  Skin: No rash noted. No erythema.  Psychiatric: Affect normal.   LABORATORY PANEL:   CBC  Recent Labs Lab 03/01/16 0449  WBC 5.8  HGB 15.1  HCT 43.6  PLT 105*   ------------------------------------------------------------------------------------------------------------------ Chemistries   Recent Labs Lab 02/29/16 1437 02/29/16 1439 03/01/16 0449  NA 136  --  137  K 3.2*  --  2.7*  CL 99*  --  100*  CO2 27  --  27  GLUCOSE 123*  --  101*  BUN 9  --  7  CREATININE 0.88  --  0.81  CALCIUM 9.2  --  8.5*  MG  --  1.4*  --   AST 67*  --   --   ALT 35  --   --   ALKPHOS 145*  --   --   BILITOT 0.8  --   --    RADIOLOGY:  Dg Chest 2 View  Result Date: 02/29/2016 CLINICAL DATA:  Tremors for the past 2 months, worsened over the past 2 days. EXAM: CHEST  2 VIEW COMPARISON:  None. FINDINGS: Lung volumes are low but the lungs are clear. Heart size is normal. No pneumothorax or pleural effusion. No acute bony abnormality. IMPRESSION: No acute disease. Electronically Signed   By: Daniel Frye M.D.   On: 02/29/2016 15:44   Ct Head Wo Contrast  Result Date: 02/29/2016 CLINICAL DATA:  Tremors for 2  months, worsening.  Balance problems. EXAM: CT HEAD WITHOUT CONTRAST TECHNIQUE: Contiguous axial images were obtained from the base of the skull through the vertex without intravenous contrast. COMPARISON:  None. FINDINGS: Brain: The brainstem, cerebellum, cerebral peduncles, thalami, basal ganglia, basilar cisterns, and ventricular system appear within normal limits. Periventricular white matter and corona radiata hypodensities favor chronic ischemic microvascular white matter disease. No findings of acute  CVA. A 0.8 cm dense extra-axial lesion along the upper margin of the left temporal lobe on image 35/4 is highly likely to be a benign meningioma, I am very skeptical of this being a focal hematoma given the small size and confined appearance. Vascular: There is atherosclerotic calcification of the cavernous carotid arteries bilaterally. Skull: Unremarkable Sinuses/Orbits: Chronic ethmoid sinusitis. Other: No supplemental non-categorized findings. IMPRESSION: 1. Small (0.8 cm) focal extra-axial hyperdense lesion along the upper margin of the left temporal lobe. Given the confined location and overall appearance (basket best assessed on the coronal images) I believe that this probably represents a meningioma. 2. Periventricular white matter and corona radiata hypodensities favor chronic ischemic microvascular white matter disease. 3. Atherosclerosis 4. Chronic ethmoid sinusitis. Electronically Signed   By: Daniel Frye M.D.   On: 02/29/2016 15:44   Ct Angio Chest Pe W And/or Wo Contrast  Result Date: 02/29/2016 CLINICAL DATA:  65 y.o. male patient reports as nurse's note finds been shaky for several months worse for the last 2 days. He drinks he says "a lot" of alcohol every day last drink was about 2 days ago he is getting more shaky over the last 2 days ago he is getting more shaky over the last 2 days. He has difficulty walking. His unstable when he walks. This is been getting worse. Patient reports he is also having shaking chills and is coughing up green phlegm. He is not any more short of breath than usual with his COPD. EXAM: CT ANGIOGRAPHY CHEST WITH CONTRAST TECHNIQUE: Multidetector CT imaging of the chest was performed using the standard protocol during bolus administration of intravenous contrast. Multiplanar CT image reconstructions and MIPs were obtained to evaluate the vascular anatomy. CONTRAST:  75 cc Isovue 370 COMPARISON:  02/29/2016 FINDINGS: Cardiovascular: No filling defect is  identified in the pulmonary arterial tree to suggest pulmonary embolus. Atherosclerotic calcification of the aortic arch. Suspected coronary artery atherosclerotic calcification. Mediastinum/Nodes: Unremarkable Lungs/Pleura: Paraseptal emphysema. Airway thickening is present, suggesting bronchitis or reactive airways disease. Upper Abdomen: Unremarkable Musculoskeletal: Thoracic spondylosis. Review of the MIP images confirms the above findings. IMPRESSION: 1. Airway thickening is present, suggesting bronchitis or reactive airways disease. 2. Mild paraseptal emphysema. 3. No filling defect is identified in the pulmonary arterial tree to suggest pulmonary embolus. 4. Aortic and suspected coronary artery atherosclerosis. Electronically Signed   By: Daniel Frye M.D.   On: 02/29/2016 18:22   ASSESSMENT AND PLAN:   * Alcohol withdrawal.   IV and PO ativan prn.   CIWA protocol.   Thiamin and folic acid suppelement.  * Hypokalemia, K still low at 2.7. Given more potassium and follow-up level.  Hypomagnesemia. Given mag iv, follow-up level.  * hypertension   Cont home emds  * COPD   Cont inhalers, no exacerbation.  * Active smoking   C[place]leld to quit for 4 min, offered nicotin patch.  All the records are reviewed and case discussed with Care Management/Social Worker. Management plans discussed with the patient, family and they are in agreement.  CODE STATUS: Full code  TOTAL TIME TAKING CARE OF  THIS PATIENT: 37 minutes.   More than 50% of the time was spent in counseling/coordination of care: YES  POSSIBLE D/C IN 2 DAYS, DEPENDING ON CLINICAL CONDITION.   Demetrios Loll M.D on 03/01/2016 at 12:56 PM  Between 7am to 6pm - Pager - [phone]  After 6pm go to [email or web address] - Proofreader  [facility]  Office  [phone]  CC: Primary care physician; [name], MD  Note: This dictation was prepared with Dragon dictation along with smaller  phrase technology. Any transcriptional errors that result from this process are unintentional.

## 2016-03-01 NOTE — [facility] Notes (Signed)
Initial Nutrition Assessment  DOCUMENTATION CODES:   Not applicable  INTERVENTION:  -Diet progression as toleranted -Boost Breeze po TID, each supplement provides 250 kcal and 9 grams of protein  NUTRITION DIAGNOSIS:   Inadequate oral intake related to acute illness, chronic illness as evidenced by per patient/family report.  GOAL:   Patient will meet greater than or equal to 90% of their needs  MONITOR:   PO intake, Supplement acceptance, Labs, Weight trends  REASON FOR ASSESSMENT:   Malnutrition Screening Tool    ASSESSMENT:   65 yo male admitted with EtOH withdrawal. Pt with hx of CHF, COPD, HTN, RA.   Pt ate 100% of CL tray this AM Weight trending down per wt encounters  Labs: potassium 2.7 (supplemented), magnesium 1.4 (supplemented) Meds: MVI, thiamine  Unable to complete Nutrition-Focused physical exam at this time.    Diet Order:  Diet clear liquid Room service appropriate? Yes; Fluid consistency: Thin  Skin:  Reviewed, no issues  Last BM:  12/25  Height:   Ht Readings from Last 1 Encounters:  02/29/16 5\' 9"  (1.753 m)    Weight:   Wt Readings from Last 1 Encounters:  02/29/16 175 lb (79.4 kg)   Wt Readings from Last 10 Encounters:  02/29/16 175 lb (79.4 kg)  01/22/16 175 lb (79.4 kg)  09/25/15 195 lb 6.4 oz (88.6 kg)  07/27/15 198 lb (89.8 kg)  06/25/15 207 lb (93.9 kg)  06/04/15 208 lb (94.3 kg)  04/30/15 211 lb (95.7 kg)  04/29/15 211 lb (95.7 kg)  03/19/15 208 lb (94.3 kg)  01/26/15 214 lb 9.6 oz (97.3 kg)    BMI:  Body mass index is 25.84 kg/m.  Estimated Nutritional Needs:   Kcal:  2100-2300 kcals  Protein:  95-120 g  Fluid:  >/= 2 L  EDUCATION NEEDS:   No education needs identified at this time  [place], [place], LDN [phone] Pager  [phone] Weekend/On-Call Pager

## 2016-03-01 NOTE — Progress Notes (Signed)
MD notified of critical potassium 2.7. Received orders for 40 meq PO potassium.

## 2016-03-01 NOTE — Progress Notes (Signed)
[place] responding to an order visited Pt in [facility]. Pt was alert. Pt talked with [place] about the Bible, shared concerns about his church, and requested prayers for healing, which the [facility] provided.     03/01/16 0800  Clinical Encounter Type  Visited With Patient  Visit Type Initial  Referral From Nurse  Consult/Referral To Chaplain  Spiritual Encounters  Spiritual Needs Prayer

## 2016-03-01 NOTE — Progress Notes (Signed)
Notified by RN that pt reports wanting to die - will place psych c/s and request sitter.

## 2016-03-01 NOTE — Progress Notes (Signed)
This RN Notified prime doc about patient stating "I feel depressed." "I want to die" patient room prepared for suicide precautions. Patient does not currently does not have a plan. Continue to assess.

## 2016-03-02 DIAGNOSIS — F321 Major depressive disorder, single episode, moderate: Secondary | ICD-10-CM

## 2016-03-02 DIAGNOSIS — F1994 Other psychoactive substance use, unspecified with psychoactive substance-induced mood disorder: Secondary | ICD-10-CM

## 2016-03-02 LAB — MAGNESIUM: Magnesium: 1.7 mg/dL (ref 1.7–2.4)

## 2016-03-02 LAB — BASIC METABOLIC PANEL
Anion gap: 8 (ref 5–15)
BUN: 9 mg/dL (ref 6–20)
CO2: 26 mmol/L (ref 22–32)
Calcium: 8.1 mg/dL — ABNORMAL LOW (ref 8.9–10.3)
Chloride: 101 mmol/L (ref 101–111)
Creatinine, Ser: 0.91 mg/dL (ref 0.61–1.24)
GFR calc Af Amer: >60 mL/min (ref >60)
GFR calc non Af Amer: >60 mL/min (ref >60)
Glucose, Bld: 113 mg/dL — ABNORMAL HIGH (ref 65–99)
Potassium: 3.2 mmol/L — ABNORMAL LOW (ref 3.5–5.1)
Sodium: 135 mmol/L (ref 135–145)

## 2016-03-02 MED ORDER — ADULT MULTIVITAMIN W/MINERALS CH
1.0000 | ORAL_TABLET | Freq: Every day | ORAL | Status: DC
  Administered 2016-03-03: 1 via ORAL
  Filled 2016-03-02: qty 1

## 2016-03-02 MED ORDER — MAGNESIUM OXIDE 400 (241.3 MG) MG PO TABS
400.0000 mg | ORAL_TABLET | Freq: Once | ORAL | Status: AC
  Administered 2016-03-02: 400 mg via ORAL
  Filled 2016-03-02: qty 1

## 2016-03-02 MED ORDER — POTASSIUM CHLORIDE CRYS ER 20 MEQ PO TBCR
40.0000 meq | EXTENDED_RELEASE_TABLET | Freq: Once | ORAL | Status: AC
  Administered 2016-03-02: 40 meq via ORAL
  Filled 2016-03-02: qty 2

## 2016-03-02 MED ORDER — FOLIC ACID 1 MG PO TABS
1.0000 mg | ORAL_TABLET | Freq: Every day | ORAL | Status: DC
  Administered 2016-03-03: 1 mg via ORAL
  Filled 2016-03-02: qty 1

## 2016-03-02 MED ORDER — LORAZEPAM 2 MG/ML IJ SOLN: 1.0000 mg | Freq: Four times a day (QID) | INTRAMUSCULAR | Status: DC | PRN

## 2016-03-02 MED ORDER — VITAMIN B-1 100 MG PO TABS: 100.0000 mg | ORAL_TABLET | Freq: Every day | ORAL | Status: DC

## 2016-03-02 MED ORDER — LORAZEPAM 1 MG PO TABS: 1.0000 mg | ORAL_TABLET | Freq: Four times a day (QID) | ORAL | Status: DC | PRN

## 2016-03-02 MED ORDER — MIRTAZAPINE 15 MG PO TABS
15.0000 mg | ORAL_TABLET | Freq: Every day | ORAL | Status: DC
  Administered 2016-03-02: 15 mg via ORAL
  Filled 2016-03-02: qty 1

## 2016-03-02 MED ORDER — THIAMINE HCL 100 MG/ML IJ SOLN: 100.0000 mg | Freq: Every day | INTRAMUSCULAR | Status: DC

## 2016-03-02 NOTE — Consult Note (Signed)
[facility] Face-to-Face Psychiatry Consult   Reason for Consult:  Consult for 65 year old man with a history of substance abuse who presented to the hospital with multiple physical problems related to alcohol abuse. Now consult for depression. Referring Physician:  Bridgett Larsson Patient Identification: Daniel Frye MRN:  [id number] Principal Diagnosis: Alcohol withdrawal [facility]) Diagnosis:   Patient Active Problem List   Diagnosis Date Noted  . Substance induced mood disorder ([place]) [F19.94] 03/02/2016  . Moderate major depression, single episode ([place]) [F32.1] 03/02/2016  . Alcohol withdrawal ([place]) [F10.239] 02/29/2016  . Crack cocaine use [F14.90] 01/22/2016  . Obstructive sleep apnea of adult [G47.33] 03/30/2015  . Chronic diastolic heart failure ([place]) [I50.32] 03/30/2015  . Edema of foot [R60.0] 03/30/2015  . Alcohol abuse [F10.10] 03/23/2015  . Noncompliance [Z91.19] 03/23/2015  . Vitamin D deficiency [E55.9] 01/27/2015  . Diabetes mellitus type 2, controlled, without complications ([place]) [J17.9] 12/03/2014  . Obesity (BMI 30.0-34.9) [E66.9] 11/26/2014  . COPD (chronic obstructive pulmonary disease) ([place]) [J44.9] 06/22/2014  . Essential (primary) hypertension [I10] 06/22/2014  . Arthritis or polyarthritis, rheumatoid ([place]) [M06.9] 06/22/2014    Total Time spent with patient: 1 hour  Subjective:   Daniel Frye is a 65 y.o. male patient admitted with "I just Needed some help".  HPI:  Patient interviewed. Chart reviewed. 65 year old man came to the hospital reporting abdominal pain weight loss. Last night his depression became more obvious and he made some statements about suicide. On evaluation today the patient says he's been depressed for about 6 months. It has been getting worse for the last month. He has not been able to enjoy things that he normally does. Energy level has been very low. Sleep has been interrupted. He has not felt like eating anything and has lost about 40 pounds. He started having  thoughts about wishing he were dead although he says he never had any plan of anything to do. Meanwhile he says that he has been drinking heavily for the last several months. It has gotten up to the point where he can consume a 750 mL bottle of liquor every day. Otherwise he is not using drugs very much. He doesn't report feeling like he has any major stresses in his life driving this. Meanwhile it sounds like he's physically been getting sicker. Family has noticed him being tremulous all the time. The last day or so he has started to see visual hallucinations. Now that he has gotten into the hospital and gotten some fluid replenishment of vitamins and treatment for alcohol withdrawal he says he is feeling much better.  Social history: Lives by himself. Has a lot of extended family that he seems to have a very good relationship with including 2 sisters and a brother and 2 adult sons. 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  Medical history: Multiple chronic medical problems including COPD and hypertension and diabetes.  Substance abuse history: Patient says he used to have a heavy drug problem using mostly cocaine also marijuana. He cut back on that a few years ago. He has been detoxes and rehabs for those things in the past. He never thought that his alcohol use was a problem until the last few months. No history of delirium tremens or alcohol withdrawal seizures.  Past Psychiatric History: Other than drug abuse doesn't report any past psychiatric history. No previous psychiatric hospitalizations. No history of suicide attempts or violence. Never been on any psychiatric medicine.  Risk to Self: Is  patient at risk for suicide?: No Risk to Others:   Prior Inpatient Therapy:   Prior Outpatient Therapy:    Past Medical History:  Past Medical History:  Diagnosis Date  . CHF (congestive heart failure) ([place])   . COPD (chronic obstructive pulmonary disease)  ([place])   . Depression   . Diabetes mellitus without complication ([place])   . Hypertension   . Rheumatoid arteritis 1989   Took shots for awhile but not now  . Substance abuse     Past Surgical History:  Procedure Laterality Date  . heart surgery to drain fluid N/A 1987   Heart surgery to remove fluid    Family History:  Family History  Problem Relation Age of Onset  . COPD Brother    Family Psychiatric  History: Family does have extensive history of depression and substance abuse. He had one brother who killed himself. His description of the situation sounds like the brother had pretty out of control behavior. Also several other members of the family with alcohol and drug problems. Social History:  History  Alcohol Use  . 9.0 oz/week  . 15 Glasses of wine per week    Comment: Drinks 1/5 day of wine      History  Drug Use  . Types: "Crack" cocaine    Comment: Uses only once in awhile and does not look for it but has hard time when it is there tunrning it down.     Social History   Social History  . Marital status: Single    Spouse name: N/A  . Number of children: N/A  . Years of education: N/A   Social History Main Topics  . Smoking status: Former Smoker    Packs/day: 0.33    Years: 50.00    Types: Cigarettes    Quit date: 05/06/2014  . Smokeless tobacco: Never Used  . Alcohol use 9.0 oz/week    15 Glasses of wine per week     Comment: Drinks 1/5 day of wine   . Drug use:     Types: "Crack" cocaine     Comment: Uses only once in awhile and does not look for it but has hard time when it is there tunrning it down.   Marland Kitchen Sexual activity: Not Asked   Other Topics Concern  . None   Social History Narrative  . None   Additional Social History:    Allergies:  No Known Allergies  Labs:  Results for orders placed or performed during the hospital encounter of 02/29/16 (from the past 48 hour(s))  Basic metabolic panel     Status: Abnormal   Collection Time: 03/01/16   4:49 AM  Result Value Ref Range   Sodium 137 135 - 145 mmol/L   Potassium 2.7 (LL) 3.5 - 5.1 mmol/L    Comment: CRITICAL RESULT CALLED TO, READ BACK BY AND VERIFIED WITH ASHLEY ANDREWS ON 03/01/16 AT 0551 BY TLB    Chloride 100 (L) 101 - 111 mmol/L   CO2 27 22 - 32 mmol/L   Glucose, Bld 101 (H) 65 - 99 mg/dL   BUN 7 6 - 20 mg/dL   Creatinine, Ser 0.81 0.61 - 1.24 mg/dL   Calcium 8.5 (L) 8.9 - 10.3 mg/dL   GFR calc non Af Amer >60 >60 mL/min   GFR calc Af Amer >60 >60 mL/min    Comment: (NOTE) The eGFR has been calculated using the CKD EPI equation. This calculation has not been validated in all clinical  situations. eGFR's persistently <60 mL/min signify possible Chronic Kidney Disease.    Anion gap 10 5 - 15  CBC     Status: Abnormal   Collection Time: 03/01/16  4:49 AM  Result Value Ref Range   WBC 5.8 3.8 - 10.6 K/uL   RBC 4.70 4.40 - 5.90 MIL/uL   Hemoglobin 15.1 13.0 - 18.0 g/dL   HCT 43.6 40.0 - 52.0 %   MCV 92.7 80.0 - 100.0 fL   MCH 32.0 26.0 - 34.0 pg   MCHC 34.5 32.0 - 36.0 g/dL   RDW 16.4 (H) 11.5 - 14.5 %   Platelets 105 (L) 150 - 440 K/uL  Magnesium     Status: None   Collection Time: 03/02/16  4:49 AM  Result Value Ref Range   Magnesium 1.7 1.7 - 2.4 mg/dL  Basic metabolic panel     Status: Abnormal   Collection Time: 03/02/16  4:49 AM  Result Value Ref Range   Sodium 135 135 - 145 mmol/L   Potassium 3.2 (L) 3.5 - 5.1 mmol/L   Chloride 101 101 - 111 mmol/L   CO2 26 22 - 32 mmol/L   Glucose, Bld 113 (H) 65 - 99 mg/dL   BUN 9 6 - 20 mg/dL   Creatinine, Ser 0.91 0.61 - 1.24 mg/dL   Calcium 8.1 (L) 8.9 - 10.3 mg/dL   GFR calc non Af Amer >60 >60 mL/min   GFR calc Af Amer >60 >60 mL/min    Comment: (NOTE) The eGFR has been calculated using the CKD EPI equation. This calculation has not been validated in all clinical situations. eGFR's persistently <60 mL/min signify possible Chronic Kidney Disease.    Anion gap 8 5 - 15    Current  Facility-Administered Medications  Medication Dose Route Frequency Provider Last Rate Last Dose  . acetaminophen (TYLENOL) tablet 650 mg  650 mg Oral Q6H PRN Lytle Butte, MD      . albuterol (PROVENTIL) (2.5 MG/3ML) 0.083% nebulizer solution 2.5 mg  2.5 mg Nebulization Q4H PRN Vaughan Basta, MD      . carvedilol (COREG) tablet 6.25 mg  6.25 mg Oral BID WC Vaughan Basta, MD   6.25 mg at 03/02/16 1006  . cholecalciferol (VITAMIN D) tablet 1,000 Units  1,000 Units Oral Daily Vaughan Basta, MD   1,000 Units at 03/02/16 1006  . feeding supplement (BOOST / RESOURCE BREEZE) liquid 1 Container  1 Container Oral TID BM Demetrios Loll, MD   1 Container at 03/02/16 1007  . fluticasone furoate-vilanterol (BREO ELLIPTA) 200-25 MCG/INH 1 puff  1 puff Inhalation Daily Vaughan Basta, MD   1 puff at 03/02/16 1007  . folic acid (FOLVITE) tablet 1 mg  1 mg Oral Daily Tarvis Blossom T Mabel Unrein, MD      . furosemide (LASIX) tablet 40 mg  40 mg Oral Daily Vaughan Basta, MD   40 mg at 03/02/16 1006  . heparin injection 5,000 Units  5,000 Units Subcutaneous Q8H Vaughan Basta, MD   5,000 Units at 03/02/16 0656  . hydrALAZINE (APRESOLINE) injection 10 mg  10 mg Intravenous Q4H PRN Lytle Butte, MD   10 mg at 03/01/16 1257  . lisinopril (PRINIVIL,ZESTRIL) tablet 40 mg  40 mg Oral Q1400 Vaughan Basta, MD   40 mg at 03/01/16 1735  . LORazepam (ATIVAN) tablet 1 mg  1 mg Oral Q6H PRN Gonzella Lex, MD       Or  . LORazepam (ATIVAN) injection 1 mg  1  mg Intravenous Q6H PRN Gonzella Lex, MD      . magnesium sulfate IVPB 2 g 50 mL  2 g Intravenous Once Vaughan Basta, MD      . metFORMIN (GLUCOPHAGE) tablet 500 mg  500 mg Oral BID WC Vaughan Basta, MD   500 mg at 03/02/16 1005  . mirtazapine (REMERON) tablet 15 mg  15 mg Oral QHS Gonzella Lex, MD      . multivitamin with minerals tablet 1 tablet  1 tablet Oral Daily Gonzella Lex, MD      . oxyCODONE (Oxy  IR/ROXICODONE) immediate release tablet 5 mg  5 mg Oral Q4H PRN Lytle Butte, MD   5 mg at 03/01/16 2016  . pantoprazole (PROTONIX) EC tablet 40 mg  40 mg Oral Daily Vaughan Basta, MD   40 mg at 03/02/16 1006  . potassium chloride SA (K-DUR,KLOR-CON) CR tablet 20 mEq  20 mEq Oral BID Vaughan Basta, MD   20 mEq at 03/02/16 1006  . thiamine (VITAMIN B-1) tablet 100 mg  100 mg Oral Daily Vaughan Basta, MD   100 mg at 03/02/16 1006   Or  . thiamine (B-1) injection 100 mg  100 mg Intravenous Daily Vaughan Basta, MD        Musculoskeletal: Strength & Muscle Tone: within normal limits Gait & Station: normal Patient leans: N/A  Psychiatric Specialty Exam: Physical Exam  Nursing note and vitals reviewed. Constitutional: He appears well-developed and well-nourished.  HENT:  Head: Normocephalic and atraumatic.  Eyes: Conjunctivae are normal. Pupils are equal, round, and reactive to light.  Neck: Normal range of motion.  Cardiovascular: Regular rhythm and normal heart sounds.   Respiratory: Effort normal. No respiratory distress.  GI: Soft.  Musculoskeletal: Normal range of motion.  Neurological: He is alert.  Skin: Skin is warm and dry.  Psychiatric: He has a normal mood and affect. His speech is normal and behavior is normal. Judgment and thought content normal. He exhibits abnormal recent memory.    Review of Systems  Constitutional: Positive for weight loss.  HENT: Negative.   Eyes: Negative.   Respiratory: Negative.   Cardiovascular: Negative.   Gastrointestinal: Negative.   Musculoskeletal: Negative.   Skin: Negative.   Neurological: Negative.   Psychiatric/Behavioral: Positive for depression, hallucinations, substance abuse and suicidal ideas. The patient is nervous/anxious and has insomnia.     Blood pressure (!) 150/86, pulse 80, temperature 98.4 F (36.9 C), temperature source Oral, resp. rate 18, height _0  (1.753 m), weight 79.4 kg (175  lb), SpO2 97 %.Body mass index is 25.84 kg/m.  General Appearance: Casual  Eye Contact:  Fair  Speech:  Clear and Coherent  Volume:  Normal  Mood:  Euthymic  Affect:  Appropriate  Thought Process:  Goal Directed  Orientation:  Full (Time, Place, and Person)  Thought Content:  Tangential  Suicidal Thoughts:  No  Homicidal Thoughts:  No  Memory:  Immediate;   Good Recent;   Fair Remote;   Fair  Judgement:  Fair  Insight:  Good  Psychomotor Activity:  Normal  Concentration:  Concentration: Fair  Recall:  [employer] of Knowledge:  Fair  Language:  Fair  Akathisia:  No  Handed:  Right  AIMS (if indicated):     Assets:  Communication Skills Desire for Improvement Housing Resilience Social Support  ADL's:  Intact  Cognition:  WNL  Sleep:        Treatment Plan Summary: Daily contact with patient  to assess and evaluate symptoms and progress in treatment, Medication management and Plan 65 year old man who most acutely needs to make sure he gets withdrawn appropriately from alcohol. I don't see that there was an alcohol level checked on admission. His been at least 2 days since his last drink however so he is getting near the point where he will no longer be a risk for delirium tremens. Right now there is no sign of delirium his vitals are okay and he is not even shaking. I have gone ahead however and make sure that he has Librium orders for the CIWA protocol. I started him on a low dose of mirtazapine 15 mg at night for sleep and mood. He is quite agreeable to that. Side effects reviewed. I will continue to follow up regularly. Patient is not acutely a danger to himself and I have discontinued the order for suicide precautions and a sitter.  Disposition: Patient does not meet criteria for psychiatric inpatient admission. Supportive therapy provided about ongoing stressors. Discussed crisis plan, support from social network, calling 911, coming to the Emergency Department, and calling  Suicide Hotline.  Alethia Berthold, MD 03/02/2016 5:22 PM

## 2016-03-02 NOTE — Progress Notes (Signed)
[facility] made a follow-up visit with the Pt. Pt was alert. Pt was with his two sisters and nurse aide in the Rm. Pt talked about religion, his family, his experience in prison, and many others things. Pt seems to be delusional, but yet coherent. Pt talked about his desire to get better and requested prayers, which the [facility] offered to the Pt and his sisters.     03/02/16 1600  Clinical Encounter Type  Visited With Patient;Patient and family together  Visit Type Follow-up;Spiritual support  Referral From Nurse  Consult/Referral To Chaplain  Spiritual Encounters  Spiritual Needs Prayer;Other (Comment)

## 2016-03-02 NOTE — Progress Notes (Signed)
[place] [facility] [place] NAME: Quadry Menor    MR#:  [id number]  DATE OF BIRTH:  04-07-1950  SUBJECTIVE:  CHIEF COMPLAINT:   Chief Complaint  Patient presents with  . Tremors   The patient feels better no tremor. No abdominal pain, nausea or vomiting. He Feels depressed and had suicide ideation last night, on sitter. REVIEW OF SYSTEMS:  Review of Systems  Constitutional: Positive for malaise/fatigue. Negative for chills and fever.  HENT: Negative for congestion and nosebleeds.   Eyes: Negative for blurred vision and double vision.  Respiratory: Negative for cough, shortness of breath and stridor.   Cardiovascular: Negative for chest pain and leg swelling.  Gastrointestinal: Negative for abdominal pain, blood in stool, diarrhea, melena, nausea and vomiting.  Genitourinary: Negative for dysuria and hematuria.  Musculoskeletal: Negative for joint pain.  Skin: Negative for itching and rash.  Neurological: Negative for dizziness, focal weakness, loss of consciousness and weakness.  Psychiatric/Behavioral: Positive for depression. Negative for hallucinations and suicidal ideas. The patient is not nervous/anxious.     DRUG ALLERGIES:  No Known Allergies VITALS:  Blood pressure (!) 150/86, pulse 80, temperature 98.4 F (36.9 C), temperature source Oral, resp. rate 18, height 5\' 9"  (1.753 m), weight 175 lb (79.4 kg), SpO2 97 %. PHYSICAL EXAMINATION:  Physical Exam  Constitutional: He is oriented to person, place, and time and well-developed, well-nourished, and in no distress.  HENT:  Head: Normocephalic.  Mouth/Throat: Oropharynx is clear and moist.  Eyes: Conjunctivae and EOM are normal.  Neck: Normal range of motion. Neck supple. No JVD present. No tracheal deviation present. No thyromegaly present.  Cardiovascular: Normal rate, regular rhythm and normal heart sounds.  Exam reveals no gallop.   No murmur heard. Pulmonary/Chest: Effort  normal and breath sounds normal. No respiratory distress. He has no wheezes. He has no rales.  Abdominal: Soft. Bowel sounds are normal. He exhibits no distension. There is no tenderness.  Musculoskeletal: Normal range of motion. He exhibits no edema or tenderness.  Neurological: He is alert and oriented to person, place, and time. No cranial nerve deficit.  Skin: No rash noted. No erythema.  Psychiatric: Affect normal.   LABORATORY PANEL:   CBC  Recent Labs Lab 03/01/16 0449  WBC 5.8  HGB 15.1  HCT 43.6  PLT 105*   ------------------------------------------------------------------------------------------------------------------ Chemistries   Recent Labs Lab 02/29/16 1437  03/02/16 0449  NA 136  < > 135  K 3.2*  < > 3.2*  CL 99*  < > 101  CO2 27  < > 26  GLUCOSE 123*  < > 113*  BUN 9  < > 9  CREATININE 0.88  < > 0.91  CALCIUM 9.2  < > 8.1*  MG  --   < > 1.7  AST 67*  --   --   ALT 35  --   --   ALKPHOS 145*  --   --   BILITOT 0.8  --   --   < > = values in this interval not displayed. RADIOLOGY:  No results found. ASSESSMENT AND PLAN:   * Alcohol withdrawal.   IV and PO ativan prn.   CIWA protocol.   Thiamin and folic acid suppelement.  * Hypokalemia, K is better at 2.7. Given more potassium and follow-up level.  Hypomagnesemia. Given mag iv, improved.  * hypertension   Cont home emds  * COPD   Cont inhalers, no exacerbation.  * Active smoking  Counceleld to quit for 4 min, offered nicotin patch.  Depression and suicidal ideation. On one-to-one sitter, follow-up psych consult.  All the records are reviewed and case discussed with Care Management/Social Worker. Management plans discussed with the patient, family and they are in agreement.  CODE STATUS: Full code  TOTAL TIME TAKING CARE OF THIS PATIENT: 33 minutes.   More than 50% of the time was spent in counseling/coordination of care: YES  POSSIBLE D/C IN 2 DAYS, DEPENDING ON CLINICAL  CONDITION.   Demetrios Loll M.D on 03/02/2016 at 2:19 PM  Between 7am to 6pm - Pager - [phone]  After 6pm go to [email or web address] - Proofreader  [facility]  Office  [phone]  CC: Primary care physician; [name], MD  Note: This dictation was prepared with Dragon dictation along with smaller phrase technology. Any transcriptional errors that result from this process are unintentional.

## 2016-03-03 DIAGNOSIS — E876 Hypokalemia: Secondary | ICD-10-CM

## 2016-03-03 DIAGNOSIS — J209 Acute bronchitis, unspecified: Secondary | ICD-10-CM

## 2016-03-03 DIAGNOSIS — J441 Chronic obstructive pulmonary disease with (acute) exacerbation: Secondary | ICD-10-CM

## 2016-03-03 DIAGNOSIS — I10 Essential (primary) hypertension: Secondary | ICD-10-CM

## 2016-03-03 MED ORDER — AZITHROMYCIN 250 MG PO TABS: 250.0000 mg | ORAL_TABLET | Freq: Every day | ORAL | 0 refills | Status: DC

## 2016-03-03 MED ORDER — NICOTINE 21 MG/24HR TD PT24: 21.0000 mg | MEDICATED_PATCH | Freq: Every day | TRANSDERMAL | 0 refills | Status: DC

## 2016-03-03 MED ORDER — BOOST / RESOURCE BREEZE PO LIQD: 1.0000 | Freq: Three times a day (TID) | ORAL | 5 refills | Status: DC

## 2016-03-03 MED ORDER — THIAMINE HCL 100 MG PO TABS: 100.0000 mg | ORAL_TABLET | Freq: Every day | ORAL | 5 refills | Status: DC

## 2016-03-03 MED ORDER — FOLIC ACID 1 MG PO TABS: 1.0000 mg | ORAL_TABLET | Freq: Every day | ORAL | 5 refills | Status: DC

## 2016-03-03 MED ORDER — ADULT MULTIVITAMIN W/MINERALS CH: 1.0000 | ORAL_TABLET | Freq: Every day | ORAL | 5 refills | Status: DC

## 2016-03-03 MED ORDER — LISINOPRIL 40 MG PO TABS: 40.0000 mg | ORAL_TABLET | Freq: Every day | ORAL | 5 refills | Status: DC

## 2016-03-03 MED ORDER — MIRTAZAPINE 15 MG PO TABS: 15.0000 mg | ORAL_TABLET | Freq: Every day | ORAL | 5 refills | Status: DC

## 2016-03-03 MED ORDER — AZITHROMYCIN 250 MG PO TABS: 250.0000 mg | ORAL_TABLET | Freq: Every day | ORAL | Status: DC

## 2016-03-03 MED ORDER — CHLORDIAZEPOXIDE HCL 25 MG PO CAPS: 25.0000 mg | ORAL_CAPSULE | Freq: Two times a day (BID) | ORAL | 0 refills | Status: DC

## 2016-03-03 MED ORDER — GUAIFENESIN ER 600 MG PO TB12
600.0000 mg | ORAL_TABLET | Freq: Two times a day (BID) | ORAL | Status: DC
  Administered 2016-03-03: 600 mg via ORAL
  Filled 2016-03-03: qty 1

## 2016-03-03 MED ORDER — GUAIFENESIN ER 600 MG PO TB12: 600.0000 mg | ORAL_TABLET | Freq: Two times a day (BID) | ORAL | 0 refills | Status: DC

## 2016-03-03 MED ORDER — MIRTAZAPINE 15 MG PO TABS: 30.0000 mg | ORAL_TABLET | Freq: Every day | ORAL | Status: DC

## 2016-03-03 MED ORDER — AZITHROMYCIN 250 MG PO TABS
500.0000 mg | ORAL_TABLET | Freq: Every day | ORAL | Status: AC
  Administered 2016-03-03: 500 mg via ORAL
  Filled 2016-03-03: qty 2

## 2016-03-03 MED ORDER — NICOTINE 21 MG/24HR TD PT24
21.0000 mg | MEDICATED_PATCH | Freq: Every day | TRANSDERMAL | Status: DC
  Filled 2016-03-03: qty 1

## 2016-03-03 MED ORDER — POTASSIUM CHLORIDE CRYS ER 10 MEQ PO TBCR: 20.0000 meq | EXTENDED_RELEASE_TABLET | ORAL | 5 refills | Status: DC

## 2016-03-03 NOTE — Progress Notes (Signed)
Discharge instructions given to pt including activity, diet, medications, and follow-up appts. Pt verbalized understanding. Pt to be driven home by family.

## 2016-03-03 NOTE — Consult Note (Signed)
[facility] Psych[facility] Consult   Reason for Consult:  Follow-up consult 65 year old man with alcohol abuse and symptoms of depression Referring Physician:  Ether Griffins Patient Identification: Daniel Frye MRN:  [id number] Principal Diagnosis: Alcohol withdrawal [facility]) Diagnosis:   Patient Active Problem List   Diagnosis Date Noted  . Hypokalemia [E87.6] 03/03/2016  . Hypomagnesemia [E83.42] 03/03/2016  . Essential hypertension [I10] 03/03/2016  . COPD exacerbation ([place]) [J44.1] 03/03/2016  . Acute bronchitis [J20.9] 03/03/2016  . Substance induced mood disorder ([place]) [F19.94] 03/02/2016  . Moderate major depression, single episode ([place]) [F32.1] 03/02/2016  . Alcohol withdrawal ([place]) [F10.239] 02/29/2016  . Crack cocaine use [F14.90] 01/22/2016  . Obstructive sleep apnea of adult [G47.33] 03/30/2015  . Chronic diastolic heart failure ([place]) [I50.32] 03/30/2015  . Edema of foot [R60.0] 03/30/2015  . Alcohol abuse [F10.10] 03/23/2015  . Noncompliance [Z91.19] 03/23/2015  . Vitamin D deficiency [E55.9] 01/27/2015  . Diabetes mellitus type 2, controlled, without complications ([place]) [E42.3] 12/03/2014  . Obesity (BMI 30.0-34.9) [E66.9] 11/26/2014  . COPD (chronic obstructive pulmonary disease) ([place]) [J44.9] 06/22/2014  . Essential (primary) hypertension [I10] 06/22/2014  . Arthritis or polyarthritis, rheumatoid ([place]) [M06.9] 06/22/2014    Total Time spent with patient: 30 minutes  Subjective:   Daniel Frye is a 65 y.o. male patient admitted with "I am feeling so much better".  HPI:  Follow-up on consult from yesterday. Patient states he is feeling much better. He has been able to ambulate without difficulty. He is eating normally. He says his mood is feeling upbeat and hopeful. He comes across as enthusiastic and very positive. He says that he knows he has to stop drinking or he will die and he is determined to do it. Able to articulate multiple positive things about his life. No side  effects or problems with medicine.  Past Psychiatric History: See previous notes. Has a past history of drug abuse primarily  Risk to Self: Is patient at risk for suicide?: No Risk to Others:   Prior Inpatient Therapy:   Prior Outpatient Therapy:    Past Medical History:  Past Medical History:  Diagnosis Date  . CHF (congestive heart failure) ([place])   . COPD (chronic obstructive pulmonary disease) ([place])   . Depression   . Diabetes mellitus without complication ([place])   . Hypertension   . Rheumatoid arteritis 1989   Took shots for awhile but not now  . Substance abuse     Past Surgical History:  Procedure Laterality Date  . heart surgery to drain fluid N/A 1987   Heart surgery to remove fluid    Family History:  Family History  Problem Relation Age of Onset  . COPD Brother    Family Psychiatric  History: Sounds like he had one brother committed suicide and several people in the family who may have had substance abuse issues Social History:  History  Alcohol Use  . 9.0 oz/week  . 15 Glasses of wine per week    Comment: Drinks 1/5 day of wine      History  Drug Use  . Types: "Crack" cocaine    Comment: Uses only once in awhile and does not look for it but has hard time when it is there tunrning it down.     Social History   Social History  . Marital status: Single    Spouse name: N/A  . Number of children: N/A  . Years of education: N/A   Social History Main Topics  . Smoking status: Former Smoker  Packs/day: 0.33    Years: 50.00    Types: Cigarettes    Quit date: 05/06/2014  . Smokeless tobacco: Never Used  . Alcohol use 9.0 oz/week    15 Glasses of wine per week     Comment: Drinks 1/5 day of wine   . Drug use:     Types: "Crack" cocaine     Comment: Uses only once in awhile and does not look for it but has hard time when it is there tunrning it down.   Marland Kitchen Sexual activity: Not Asked   Other Topics Concern  . None   Social History Narrative  . None    Additional Social History:    Allergies:  No Known Allergies  Labs:  Results for orders placed or performed during the hospital encounter of 02/29/16 (from the past 48 hour(s))  Magnesium     Status: None   Collection Time: 03/02/16  4:49 AM  Result Value Ref Range   Magnesium 1.7 1.7 - 2.4 mg/dL  Basic metabolic panel     Status: Abnormal   Collection Time: 03/02/16  4:49 AM  Result Value Ref Range   Sodium 135 135 - 145 mmol/L   Potassium 3.2 (L) 3.5 - 5.1 mmol/L   Chloride 101 101 - 111 mmol/L   CO2 26 22 - 32 mmol/L   Glucose, Bld 113 (H) 65 - 99 mg/dL   BUN 9 6 - 20 mg/dL   Creatinine, Ser 0.91 0.61 - 1.24 mg/dL   Calcium 8.1 (L) 8.9 - 10.3 mg/dL   GFR calc non Af Amer >60 >60 mL/min   GFR calc Af Amer >60 >60 mL/min    Comment: (NOTE) The eGFR has been calculated using the CKD EPI equation. This calculation has not been validated in all clinical situations. eGFR's persistently <60 mL/min signify possible Chronic Kidney Disease.    Anion gap 8 5 - 15    Current Facility-Administered Medications  Medication Dose Route Frequency Provider Last Rate Last Dose  . acetaminophen (TYLENOL) tablet 650 mg  650 mg Oral Q6H PRN Lytle Butte, MD      . albuterol (PROVENTIL) (2.5 MG/3ML) 0.083% nebulizer solution 2.5 mg  2.5 mg Nebulization Q4H PRN Vaughan Basta, MD      . Derrill Memo ON 03/04/2016] azithromycin (ZITHROMAX) tablet 250 mg  250 mg Oral Daily Theodoro Grist, MD      . carvedilol (COREG) tablet 6.25 mg  6.25 mg Oral BID WC Vaughan Basta, MD   6.25 mg at 03/03/16 1728  . cholecalciferol (VITAMIN D) tablet 1,000 Units  1,000 Units Oral Daily Vaughan Basta, MD   1,000 Units at 03/03/16 [id number]  . feeding supplement (BOOST / RESOURCE BREEZE) liquid 1 Container  1 Container Oral TID BM Demetrios Loll, MD   1 Container at 03/03/16 1427  . fluticasone furoate-vilanterol (BREO ELLIPTA) 200-25 MCG/INH 1 puff  1 puff Inhalation Daily Vaughan Basta, MD   1  puff at 02/40/97 3532  . folic acid (FOLVITE) tablet 1 mg  1 mg Oral Daily Gonzella Lex, MD   1 mg at 03/03/16 9924  . furosemide (LASIX) tablet 40 mg  40 mg Oral Daily Vaughan Basta, MD   40 mg at 03/03/16 0923  . guaiFENesin (MUCINEX) 12 hr tablet 600 mg  600 mg Oral BID Theodoro Grist, MD   600 mg at 03/03/16 1021  . heparin injection 5,000 Units  5,000 Units Subcutaneous Q8H Vaughan Basta, MD   5,000 Units at 03/03/16  1427  . hydrALAZINE (APRESOLINE) injection 10 mg  10 mg Intravenous Q4H PRN Lytle Butte, MD   10 mg at 03/01/16 1257  . lisinopril (PRINIVIL,ZESTRIL) tablet 40 mg  40 mg Oral Q1400 Vaughan Basta, MD   40 mg at 03/03/16 1427  . LORazepam (ATIVAN) tablet 1 mg  1 mg Oral Q6H PRN Gonzella Lex, MD       Or  . LORazepam (ATIVAN) injection 1 mg  1 mg Intravenous Q6H PRN Gonzella Lex, MD      . magnesium sulfate IVPB 2 g 50 mL  2 g Intravenous Once Vaughan Basta, MD      . metFORMIN (GLUCOPHAGE) tablet 500 mg  500 mg Oral BID WC Vaughan Basta, MD   500 mg at 03/03/16 1728  . mirtazapine (REMERON) tablet 30 mg  30 mg Oral QHS Gonzella Lex, MD      . multivitamin with minerals tablet 1 tablet  1 tablet Oral Daily Gonzella Lex, MD   1 tablet at 03/03/16 1638  . nicotine (NICODERM CQ - dosed in mg/24 hours) patch 21 mg  21 mg Transdermal Daily Theodoro Grist, MD      . oxyCODONE (Oxy IR/ROXICODONE) immediate release tablet 5 mg  5 mg Oral Q4H PRN Lytle Butte, MD   5 mg at 03/01/16 2016  . pantoprazole (PROTONIX) EC tablet 40 mg  40 mg Oral Daily Vaughan Basta, MD   40 mg at 03/03/16 0923  . potassium chloride SA (K-DUR,KLOR-CON) CR tablet 20 mEq  20 mEq Oral BID Vaughan Basta, MD   20 mEq at 03/03/16 0923  . thiamine (VITAMIN B-1) tablet 100 mg  100 mg Oral Daily Vaughan Basta, MD   100 mg at 03/02/16 1006   Or  . thiamine (B-1) injection 100 mg  100 mg Intravenous Daily Vaughan Basta, MD   100 mg at  03/03/16 4665    Musculoskeletal: Strength & Muscle Tone: within normal limits Gait & Station: normal Patient leans: N/A  Psychiatric Specialty Exam: Physical Exam  Nursing note and vitals reviewed. Constitutional: He appears well-developed and well-nourished.  HENT:  Head: Normocephalic and atraumatic.  Eyes: Conjunctivae are normal. Pupils are equal, round, and reactive to light.  Neck: Normal range of motion.  Cardiovascular: Regular rhythm and normal heart sounds.   Respiratory: Effort normal. No respiratory distress.  GI: Soft.  Musculoskeletal: Normal range of motion.  Neurological: He is alert.  Skin: Skin is warm and dry.  Psychiatric: He has a normal mood and affect. His speech is normal and behavior is normal. Judgment and thought content normal. Cognition and memory are normal.    ROS  Blood pressure 138/84, pulse 73, temperature 97.7 F (36.5 C), temperature source Oral, resp. rate 20, height '5\' 9"'  (1.753 m), weight 79.4 kg (175 lb), SpO2 98 %.Body mass index is 25.84 kg/m.  General Appearance: Fairly Groomed  Eye Contact:  Good  Speech:  Clear and Coherent  Volume:  Normal  Mood:  Euthymic  Affect:  Appropriate  Thought Process:  Goal Directed  Orientation:  Full (Time, Place, and Person)  Thought Content:  Logical  Suicidal Thoughts:  No  Homicidal Thoughts:  No  Memory:  Immediate;   Good Recent;   Fair Remote;   Fair  Judgement:  Good  Insight:  Good  Psychomotor Activity:  Normal  Concentration:  Concentration: Good  Recall:  Good  Fund of Knowledge:  Good  Language:  Good  Akathisia:  No  Handed:  Right  AIMS (if indicated):     Assets:  Communication Skills Desire for Improvement Housing Resilience Social Support  ADL's:  Intact  Cognition:  WNL  Sleep:        Treatment Plan Summary: Medication management and Plan Pleasant gentleman who seems to be feeling much better. Not having any acute alcohol withdrawal. No sign of delirium. I  understand he might be discharged either tonight or tomorrow. I have adjusted his mirtazapine to 30 mg at night which is a more appropriate dose for depression. Patient is completely agreeable to this. He is strongly encouraged to follow-up with [facility] or it involved with active substance abuse treatment in the community. I will sign out his presence if he is still in the hospital tomorrow. No other change to treatment plan.  Disposition: Patient does not meet criteria for psychiatric inpatient admission. Supportive therapy provided about ongoing stressors.  Alethia Berthold, MD 03/03/2016 7:02 PM

## 2016-03-03 NOTE — Discharge Summary (Signed)
[facility] NAME: Daniel Frye    MR#:  [id number]  DATE OF BIRTH:  03/01/1951  DATE OF ADMISSION:  02/29/2016 ADMITTING PHYSICIAN: Vaughan Basta, MD  DATE OF DISCHARGE: No discharge date for patient encounter.  PRIMARY CARE PHYSICIAN: [name], MD     ADMISSION DIAGNOSIS:  Alcohol withdrawal syndrome without complication (HCC) [id number]  DISCHARGE DIAGNOSIS:  Principal Problem:   Alcohol withdrawal (HCC) Active Problems:   Alcohol abuse   Substance induced mood disorder (HCC)   Moderate major depression, single episode (HCC)   Hypokalemia   Hypomagnesemia   Acute bronchitis   Essential hypertension   COPD exacerbation ([place])   SECONDARY DIAGNOSIS:   Past Medical History:  Diagnosis Date  . CHF (congestive heart failure) ([place])   . COPD (chronic obstructive pulmonary disease) ([place])   . Depression   . Diabetes mellitus without complication ([place])   . Hypertension   . Rheumatoid arteritis 1989   Took shots for awhile but not now  . Substance abuse     .pro HOSPITAL COURSE:  [name]  is a 65 y.o. male with a known history of CHF, COPD, Htn, RA, Chronic alcoholism- did not drink for last 2 days- having generalized shaking and falling so came to ER. He was found to be in alcohol withdrawal. His labs revealed hypokalemia, hypomagnesemia, thrombocytopenia. He was initiated on ativan for withdrawal and improved. He was seen by Dr. Weber Cooks, psychiatrist,  for suicidal ideation who initiated the patient on Remeron with improvement of his condition. The patient was also complaining of phlegm production, chest congestion, he was started on Humibid and Zithromax for bronchitis. He was felt to be stable to be discharged home today. Discussion by problem: * Alcohol withdrawal. Received  ativan prn for CIWA protocol, resolved, scale readings are about 0 all day today Continue Thiamine and folic acid  suppelementation.  * Hypokalemia, K was  2.7 on admission, improved.   *Hypomagnesemia. Given mag iv, improved.  * hypertension Cont home meds, well controled  * COPD Cont inhalers, no exacerbation.  * Active smoking Counceleld to quit for 4 min again, continue nicotin patch at home.  Depression and suicidal ideation.  psych saw patient in consultation, continue Remeron  ^ acute bronchitis, Zithromax and Humibid, stop smoking, all questions answered.   DISCHARGE CONDITIONS:   stable  CONSULTS OBTAINED:  Treatment Team:  Gonzella Lex, MD  DRUG ALLERGIES:  No Known Allergies  DISCHARGE MEDICATIONS:   Current Discharge Medication List    START taking these medications   Details  azithromycin (ZITHROMAX) 250 MG tablet Take 1 tablet (250 mg total) by mouth daily. Qty: 6 each, Refills: 0    chlordiazePOXIDE (LIBRIUM) 25 MG capsule Take 1 capsule (25 mg total) by mouth 2 (two) times daily. Please take 1 pill twice daily for 2 days, then 1 pill daily for 2 days, then stop, thank you Qty: 6 capsule, Refills: 0    feeding supplement (BOOST / RESOURCE BREEZE) LIQD Take 1 Container by mouth 3 (three) times daily between meals. Qty: 90 Container, Refills: 5    folic acid (FOLVITE) 1 MG tablet Take 1 tablet (1 mg total) by mouth daily. Qty: 30 tablet, Refills: 5    guaiFENesin (MUCINEX) 600 MG 12 hr tablet Take 1 tablet (600 mg total) by mouth 2 (two) times daily. Qty: 20 tablet, Refills: 0    mirtazapine (REMERON) 15 MG tablet Take 1 tablet (15  mg total) by mouth at bedtime. Qty: 30 tablet, Refills: 5    Multiple Vitamin (MULTIVITAMIN WITH MINERALS) TABS tablet Take 1 tablet by mouth daily. Qty: 30 tablet, Refills: 5    nicotine (NICODERM CQ - DOSED IN MG/24 HOURS) 21 mg/24hr patch Place 1 patch (21 mg total) onto the skin daily. Qty: 28 patch, Refills: 0    potassium chloride SA (K-DUR,KLOR-CON) 10 MEQ tablet Take 2 tablets (20 mEq total) by mouth every  other day. Qty: 30 tablet, Refills: 5    thiamine 100 MG tablet Take 1 tablet (100 mg total) by mouth daily. Qty: 30 tablet, Refills: 5      CONTINUE these medications which have CHANGED   Details  lisinopril (PRINIVIL,ZESTRIL) 40 MG tablet Take 1 tablet (40 mg total) by mouth daily at 2 PM. Qty: 30 tablet, Refills: 5      CONTINUE these medications which have NOT CHANGED   Details  albuterol (VENTOLIN HFA) 108 (90 Base) MCG/ACT inhaler Inhale 2 puffs into the lungs every 4 (four) hours as needed for wheezing or shortness of breath. Qty: 18 g, Refills: 5   Associated Diagnoses: Chronic obstructive pulmonary disease, unspecified COPD type (HCC)    carvedilol (COREG) 6.25 MG tablet Take 1 tablet (6.25 mg total) by mouth 2 (two) times daily with a meal. Qty: 60 tablet, Refills: 0   Associated Diagnoses: Essential (primary) hypertension    Cholecalciferol (VITAMIN D) 2000 UNITS CAPS Take 1 capsule (2,000 Units total) by mouth daily. Qty: 30 capsule    fluticasone furoate-vilanterol (BREO ELLIPTA) 200-25 MCG/INH AEPB Inhale 1 puff into the lungs daily. Qty: 60 each, Refills: 5   Associated Diagnoses: Chronic obstructive pulmonary disease, unspecified COPD type (HCC)    furosemide (LASIX) 40 MG tablet Take 1 tablet (40 mg total) by mouth daily. Qty: 30 tablet, Refills: 0   Associated Diagnoses: Chronic diastolic heart failure (HCC)    metFORMIN (GLUCOPHAGE) 500 MG tablet Take 1 tablet (500 mg total) by mouth 2 (two) times daily with a meal. Qty: 60 tablet, Refills: 2    albuterol (PROVENTIL) (2.5 MG/3ML) 0.083% nebulizer solution Take 3 mLs (2.5 mg total) by nebulization every 4 (four) hours as needed for wheezing or shortness of breath. Qty: 75 mL, Refills: 3    Lactase (DAIRY DIGESTIVE SUPPLEMENT PO) Take 1 tablet by mouth 2 (two) times daily. Reported on 07/27/2015         DISCHARGE INSTRUCTIONS:    Follow up witgh PCP in 1 week  If you experience worsening of your  admission symptoms, develop shortness of breath, life threatening emergency, suicidal or homicidal thoughts you must seek medical attention immediately by calling 911 or calling your MD immediately  if symptoms less severe.  You Must read complete instructions/literature along with all the possible adverse reactions/side effects for all the Medicines you take and that have been prescribed to you. Take any new Medicines after you have completely understood and accept all the possible adverse reactions/side effects.   Please note  You were cared for by a hospitalist during your hospital stay. If you have any questions about your discharge medications or the care you received while you were in the hospital after you are discharged, you can call the unit and asked to speak with the hospitalist on call if the hospitalist that took care of you is not available. Once you are discharged, your primary care physician will handle any further medical issues. Please note that NO REFILLS for any discharge  medications will be authorized once you are discharged, as it is imperative that you return to your primary care physician (or establish a relationship with a primary care physician if you do not have one) for your aftercare needs so that they can reassess your need for medications and monitor your lab values.    Today   CHIEF COMPLAINT:   Chief Complaint  Patient presents with  . Tremors    HISTORY OF PRESENT ILLNESS:  Daniel Frye  is a 65 y.o. male with a known history of CHF, COPD, Htn, RA, Chronic alcoholism- did not drink for last 2 days- having generalized shaking and falling so came to ER. He was found to be in alcohol withdrawal. His labs revealed hypokalemia, hypomagnesemia, thrombocytopenia. He was initiated on ativan for withdrawal and improved. He was seen by Dr. Weber Cooks, psychiatrist,  for suicidal ideation who initiated the patient on Remeron with improvement of his condition. The patient was  also complaining of phlegm production, chest congestion, he was started on Humibid and Zithromax for bronchitis. He was felt to be stable to be discharged home today. Discussion by problem: * Alcohol withdrawal. Received  ativan prn for CIWA protocol, resolved, scale readings are about 0 all day today Continue Thiamine and folic acid suppelementation.  * Hypokalemia, K was  2.7 on admission, improved.   *Hypomagnesemia. Given mag iv, improved.  * hypertension Cont home meds, well controled  * COPD Cont inhalers, no exacerbation.  * Active smoking Counceleld to quit for 4 min again, continue nicotin patch at home.  Depression and suicidal ideation.  psych saw patient in consultation, continue Remeron  ^ acute bronchitis, Zithromax and Humibid, stop smoking, all questions answered.     VITAL SIGNS:  Blood pressure 138/84, pulse 73, temperature 97.7 F (36.5 C), temperature source Oral, resp. rate 20, height 5\' 9"  (1.753 m), weight 79.4 kg (175 lb), SpO2 98 %.  I/O:   Intake/Output Summary (Last 24 hours) at 03/03/16 1810 Last data filed at 03/03/16 1407  Gross per 24 hour  Intake              600 ml  Output              200 ml  Net              400 ml    PHYSICAL EXAMINATION:  GENERAL:  65 y.o.-year-old patient lying in the bed with no acute distress.  EYES: Pupils equal, round, reactive to light and accommodation. No scleral icterus. Extraocular muscles intact.  HEENT: Head atraumatic, normocephalic. Oropharynx and nasopharynx clear.  NECK:  Supple, no jugular venous distention. No thyroid enlargement, no tenderness.  LUNGS: Normal breath sounds bilaterally, no wheezing, rales,rhonchi or crepitation. No use of accessory muscles of respiration.  CARDIOVASCULAR: S1, S2 normal. No murmurs, rubs, or gallops.  ABDOMEN: Soft, non-tender, non-distended. Bowel sounds present. No organomegaly or mass.  EXTREMITIES: No pedal edema, cyanosis, or clubbing.   NEUROLOGIC: Cranial nerves II through XII are intact. Muscle strength 5/5 in all extremities. Sensation intact. Gait not checked.  PSYCHIATRIC: The patient is alert and oriented x 3.  SKIN: No obvious rash, lesion, or ulcer.   DATA REVIEW:   CBC  Recent Labs Lab 03/01/16 0449  WBC 5.8  HGB 15.1  HCT 43.6  PLT 105*    Chemistries   Recent Labs Lab 02/29/16 1437  03/02/16 0449  NA 136  < > 135  K 3.2*  < > 3.2*  CL 99*  < > 101  CO2 27  < > 26  GLUCOSE 123*  < > 113*  BUN 9  < > 9  CREATININE 0.88  < > 0.91  CALCIUM 9.2  < > 8.1*  MG  --   < > 1.7  AST 67*  --   --   ALT 35  --   --   ALKPHOS 145*  --   --   BILITOT 0.8  --   --   < > = values in this interval not displayed.  Cardiac Enzymes No results for input(s): TROPONINI in the last 168 hours.  Microbiology Results  No results found for this or any previous visit.  RADIOLOGY:  No results found.  EKG:   Orders placed or performed during the hospital encounter of 02/29/16  . ED EKG  . ED EKG  . EKG 12-Lead  . EKG 12-Lead      Management plans discussed with the patient, family and they are in agreement.  CODE STATUS:     Code Status Orders        Start     Ordered   02/29/16 2000  Full code  Continuous     02/29/16 1959    Code Status History    Date Active Date Inactive Code Status Order ID Comments User Context   This patient has a current code status but no historical code status.      TOTAL TIME TAKING CARE OF THIS PATIENT: 40 minutes.    Theodoro Grist M.D on 03/03/2016 at 6:10 PM  Between 7am to 6pm - Pager - [phone]  After 6pm go to [email or web address] - password [email or web address]  Office  [phone]  CC: Primary care physician; [name], MD

## 2016-03-03 NOTE — Care Management Important Message (Signed)
Important Message  Patient Details  Name: Daniel Frye MRN: [id number] Date of Birth: February 14, 1951   Medicare Important Message Given:  Yes    Beverly Sessions, RN 03/03/2016, 10:28 AM

## 2016-04-15 ENCOUNTER — Inpatient Hospital Stay
Admission: EM | Admit: 2016-04-15 | Discharge: 2016-04-17 | DRG: 897 | Disposition: A | Discharge status: Home / Self Care | Payer: Medicare PPO | Attending: Internal Medicine | Admitting: Internal Medicine

## 2016-04-15 ENCOUNTER — Encounter: Payer: Self-pay | Admitting: Emergency Medicine

## 2016-04-15 DIAGNOSIS — I110 Hypertensive heart disease with heart failure: Secondary | ICD-10-CM | POA: Diagnosis present

## 2016-04-15 DIAGNOSIS — E876 Hypokalemia: Secondary | ICD-10-CM | POA: Diagnosis not present

## 2016-04-15 DIAGNOSIS — Z7984 Long term (current) use of oral hypoglycemic drugs: Secondary | ICD-10-CM | POA: Diagnosis not present

## 2016-04-15 DIAGNOSIS — F10930 Alcohol use, unspecified with withdrawal, uncomplicated: Secondary | ICD-10-CM

## 2016-04-15 DIAGNOSIS — F329 Major depressive disorder, single episode, unspecified: Secondary | ICD-10-CM | POA: Diagnosis present

## 2016-04-15 DIAGNOSIS — F1410 Cocaine abuse, uncomplicated: Secondary | ICD-10-CM | POA: Diagnosis present

## 2016-04-15 DIAGNOSIS — R262 Difficulty in walking, not elsewhere classified: Secondary | ICD-10-CM

## 2016-04-15 DIAGNOSIS — F10239 Alcohol dependence with withdrawal, unspecified: Secondary | ICD-10-CM | POA: Diagnosis present

## 2016-04-15 DIAGNOSIS — J449 Chronic obstructive pulmonary disease, unspecified: Secondary | ICD-10-CM | POA: Diagnosis present

## 2016-04-15 DIAGNOSIS — E119 Type 2 diabetes mellitus without complications: Secondary | ICD-10-CM | POA: Diagnosis present

## 2016-04-15 DIAGNOSIS — Z7951 Long term (current) use of inhaled steroids: Secondary | ICD-10-CM | POA: Diagnosis not present

## 2016-04-15 DIAGNOSIS — R296 Repeated falls: Secondary | ICD-10-CM

## 2016-04-15 DIAGNOSIS — M069 Rheumatoid arthritis, unspecified: Secondary | ICD-10-CM | POA: Diagnosis present

## 2016-04-15 DIAGNOSIS — F10939 Alcohol use, unspecified with withdrawal, unspecified: Secondary | ICD-10-CM | POA: Diagnosis present

## 2016-04-15 DIAGNOSIS — Z825 Family history of asthma and other chronic lower respiratory diseases: Secondary | ICD-10-CM | POA: Diagnosis not present

## 2016-04-15 DIAGNOSIS — F17200 Nicotine dependence, unspecified, uncomplicated: Secondary | ICD-10-CM | POA: Diagnosis present

## 2016-04-15 DIAGNOSIS — F10230 Alcohol dependence with withdrawal, uncomplicated: Secondary | ICD-10-CM | POA: Diagnosis present

## 2016-04-15 DIAGNOSIS — I509 Heart failure, unspecified: Secondary | ICD-10-CM | POA: Diagnosis present

## 2016-04-15 LAB — CBC WITH DIFFERENTIAL/PLATELET
Basophils Absolute: 0 10*3/uL (ref 0–0.1)
Basophils Relative: 0 %
Eosinophils Absolute: 0 10*3/uL (ref 0–0.7)
Eosinophils Relative: 0 %
HCT: 44.7 % (ref 40.0–52.0)
Hemoglobin: 15.2 g/dL (ref 13.0–18.0)
Lymphocytes Relative: 9 %
Lymphs Abs: 0.6 10*3/uL — ABNORMAL LOW (ref 1.0–3.6)
MCH: 32.3 pg (ref 26.0–34.0)
MCHC: 33.9 g/dL (ref 32.0–36.0)
MCV: 95.2 fL (ref 80.0–100.0)
Monocytes Absolute: 0.2 10*3/uL (ref 0.2–1.0)
Monocytes Relative: 2 %
Neutro Abs: 5.8 10*3/uL (ref 1.4–6.5)
Neutrophils Relative %: 89 %
Platelets: 179 10*3/uL (ref 150–440)
RBC: 4.69 MIL/uL (ref 4.40–5.90)
RDW: 16.3 % — ABNORMAL HIGH (ref 11.5–14.5)
WBC: 6.6 10*3/uL (ref 3.8–10.6)

## 2016-04-15 LAB — URINE DRUG SCREEN, QUALITATIVE (ARMC ONLY)
Amphetamines, Ur Screen: NOT DETECTED
Barbiturates, Ur Screen: NOT DETECTED
Benzodiazepine, Ur Scrn: NOT DETECTED
Cannabinoid 50 Ng, Ur ~~LOC~~: NOT DETECTED
Cocaine Metabolite,Ur ~~LOC~~: POSITIVE — AB
MDMA (Ecstasy)Ur Screen: NOT DETECTED
Methadone Scn, Ur: NOT DETECTED
Opiate, Ur Screen: NOT DETECTED
Phencyclidine (PCP) Ur S: NOT DETECTED
Tricyclic, Ur Screen: NOT DETECTED

## 2016-04-15 LAB — URINALYSIS, COMPLETE (UACMP) WITH MICROSCOPIC
Bilirubin Urine: NEGATIVE
Glucose, UA: 50 mg/dL — AB
Hgb urine dipstick: NEGATIVE
Ketones, ur: 5 mg/dL — AB
Leukocytes, UA: NEGATIVE
Nitrite: NEGATIVE
Protein, ur: 100 mg/dL — AB
Specific Gravity, Urine: 1.011 (ref 1.005–1.030)
Squamous Epithelial / LPF: NONE SEEN
WBC, UA: NONE SEEN WBC/hpf (ref 0–5)
pH: 6 (ref 5.0–8.0)

## 2016-04-15 LAB — COMPREHENSIVE METABOLIC PANEL
ALT: 29 U/L (ref 17–63)
AST: 46 U/L — ABNORMAL HIGH (ref 15–41)
Albumin: 4.2 g/dL (ref 3.5–5.0)
Alkaline Phosphatase: 118 U/L (ref 38–126)
Anion gap: 14 (ref 5–15)
BUN: 10 mg/dL (ref 6–20)
CO2: 23 mmol/L (ref 22–32)
Calcium: 9.2 mg/dL (ref 8.9–10.3)
Chloride: 100 mmol/L — ABNORMAL LOW (ref 101–111)
Creatinine, Ser: 1.07 mg/dL (ref 0.61–1.24)
GFR calc Af Amer: >60 mL/min (ref >60)
GFR calc non Af Amer: >60 mL/min (ref >60)
Glucose, Bld: 127 mg/dL — ABNORMAL HIGH (ref 65–99)
Potassium: 3.6 mmol/L (ref 3.5–5.1)
Sodium: 137 mmol/L (ref 135–145)
Total Bilirubin: 0.7 mg/dL (ref 0.3–1.2)
Total Protein: 7.9 g/dL (ref 6.5–8.1)

## 2016-04-15 LAB — GLUCOSE, CAPILLARY: Glucose-Capillary: 140 mg/dL — ABNORMAL HIGH (ref 65–99)

## 2016-04-15 LAB — MAGNESIUM: Magnesium: 1.4 mg/dL — ABNORMAL LOW (ref 1.7–2.4)

## 2016-04-15 LAB — TROPONIN I: Troponin I: <0.03 ng/mL (ref <0.03)

## 2016-04-15 LAB — ACETAMINOPHEN LEVEL: Acetaminophen (Tylenol), Serum: <10 ug/mL — ABNORMAL LOW (ref 10–30)

## 2016-04-15 LAB — ETHANOL: Alcohol, Ethyl (B): 6 mg/dL — ABNORMAL HIGH (ref <5)

## 2016-04-15 MED ORDER — CARVEDILOL 6.25 MG PO TABS: 6.2500 mg | ORAL_TABLET | Freq: Once | ORAL | Status: DC

## 2016-04-15 MED ORDER — FOLIC ACID 1 MG PO TABS
1.0000 mg | ORAL_TABLET | Freq: Every day | ORAL | Status: DC
  Administered 2016-04-15 – 2016-04-17 (×3): 1 mg via ORAL
  Filled 2016-04-15 (×3): qty 1

## 2016-04-15 MED ORDER — HYDRALAZINE HCL 20 MG/ML IJ SOLN
INTRAMUSCULAR | Status: AC
  Filled 2016-04-15: qty 1

## 2016-04-15 MED ORDER — SODIUM CHLORIDE 0.9 % IV SOLN
INTRAVENOUS | Status: DC
  Administered 2016-04-15: 1000 mL via INTRAVENOUS
  Administered 2016-04-16: 05:00:00 via INTRAVENOUS

## 2016-04-15 MED ORDER — LORAZEPAM 2 MG/ML IJ SOLN
0.0000 mg | INTRAMUSCULAR | Status: DC
  Administered 2016-04-15 – 2016-04-16 (×2): 1 mg via INTRAVENOUS
  Administered 2016-04-17: 2 mg via INTRAVENOUS
  Filled 2016-04-15 (×2): qty 1

## 2016-04-15 MED ORDER — VITAMIN B-1 100 MG PO TABS
100.0000 mg | ORAL_TABLET | Freq: Every day | ORAL | Status: DC
  Administered 2016-04-16 – 2016-04-17 (×2): 100 mg via ORAL
  Filled 2016-04-15 (×2): qty 1

## 2016-04-15 MED ORDER — FLUTICASONE FUROATE-VILANTEROL 200-25 MCG/INH IN AEPB
1.0000 | INHALATION_SPRAY | Freq: Every day | RESPIRATORY_TRACT | Status: DC
  Administered 2016-04-15 – 2016-04-16 (×2): 1 via RESPIRATORY_TRACT
  Filled 2016-04-15: qty 28

## 2016-04-15 MED ORDER — SODIUM CHLORIDE 0.9% FLUSH
3.0000 mL | Freq: Two times a day (BID) | INTRAVENOUS | Status: DC
  Administered 2016-04-15 – 2016-04-17 (×3): 3 mL via INTRAVENOUS

## 2016-04-15 MED ORDER — VITAMIN D 1000 UNITS PO TABS
2000.0000 [IU] | ORAL_TABLET | Freq: Every day | ORAL | Status: DC
  Administered 2016-04-15 – 2016-04-17 (×3): 2000 [IU] via ORAL
  Filled 2016-04-15 (×3): qty 2

## 2016-04-15 MED ORDER — LORAZEPAM 1 MG PO TABS
1.0000 mg | ORAL_TABLET | Freq: Four times a day (QID) | ORAL | Status: DC | PRN
  Administered 2016-04-15: 1 mg via ORAL
  Filled 2016-04-15: qty 1

## 2016-04-15 MED ORDER — CYCLOBENZAPRINE HCL 10 MG PO TABS: 10.0000 mg | ORAL_TABLET | Freq: Three times a day (TID) | ORAL | Status: DC | PRN

## 2016-04-15 MED ORDER — LORAZEPAM 2 MG/ML IJ SOLN
2.0000 mg | INTRAMUSCULAR | Status: DC | PRN
  Filled 2016-04-15: qty 1

## 2016-04-15 MED ORDER — HEPARIN SODIUM (PORCINE) 5000 UNIT/ML IJ SOLN
5000.0000 [IU] | Freq: Three times a day (TID) | INTRAMUSCULAR | Status: DC
  Administered 2016-04-15 – 2016-04-17 (×5): 5000 [IU] via SUBCUTANEOUS
  Filled 2016-04-15 (×5): qty 1

## 2016-04-15 MED ORDER — HYDRALAZINE HCL 20 MG/ML IJ SOLN
10.0000 mg | Freq: Once | INTRAMUSCULAR | Status: AC
  Administered 2016-04-15: 10 mg via INTRAVENOUS

## 2016-04-15 MED ORDER — LORAZEPAM 2 MG/ML IJ SOLN
2.0000 mg | Freq: Once | INTRAMUSCULAR | Status: AC
  Administered 2016-04-15: 2 mg via INTRAVENOUS
  Filled 2016-04-15: qty 1

## 2016-04-15 MED ORDER — LISINOPRIL 20 MG PO TABS
40.0000 mg | ORAL_TABLET | Freq: Every day | ORAL | Status: DC
  Administered 2016-04-16: 40 mg via ORAL
  Filled 2016-04-15: qty 2

## 2016-04-15 MED ORDER — LORAZEPAM 2 MG/ML IJ SOLN: 0.0000 mg | Freq: Two times a day (BID) | INTRAMUSCULAR | Status: DC

## 2016-04-15 MED ORDER — THIAMINE HCL 100 MG/ML IJ SOLN
100.0000 mg | Freq: Every day | INTRAMUSCULAR | Status: DC
  Administered 2016-04-15: 100 mg via INTRAVENOUS
  Filled 2016-04-15: qty 2

## 2016-04-15 MED ORDER — MAGNESIUM SULFATE 2 GM/50ML IV SOLN
2.0000 g | Freq: Once | INTRAVENOUS | Status: AC
  Administered 2016-04-15: 2 g via INTRAVENOUS
  Filled 2016-04-15: qty 50

## 2016-04-15 MED ORDER — METOPROLOL TARTRATE 5 MG/5ML IV SOLN
5.0000 mg | Freq: Once | INTRAVENOUS | Status: AC
  Administered 2016-04-15: 5 mg via INTRAVENOUS
  Filled 2016-04-15: qty 5

## 2016-04-15 MED ORDER — SODIUM CHLORIDE 0.9 % IV SOLN
1.0000 mg | Freq: Once | INTRAVENOUS | Status: AC
  Administered 2016-04-15: 1 mg via INTRAVENOUS
  Filled 2016-04-15: qty 0.2

## 2016-04-15 MED ORDER — ADULT MULTIVITAMIN W/MINERALS CH
1.0000 | ORAL_TABLET | Freq: Every day | ORAL | Status: DC
  Administered 2016-04-15 – 2016-04-17 (×3): 1 via ORAL
  Filled 2016-04-15 (×3): qty 1

## 2016-04-15 MED ORDER — IPRATROPIUM-ALBUTEROL 0.5-2.5 (3) MG/3ML IN SOLN
3.0000 mL | Freq: Four times a day (QID) | RESPIRATORY_TRACT | Status: DC
  Administered 2016-04-15: 3 mL via RESPIRATORY_TRACT
  Filled 2016-04-15 (×2): qty 3

## 2016-04-15 MED ORDER — INSULIN ASPART 100 UNIT/ML ~~LOC~~ SOLN
0.0000 [IU] | Freq: Three times a day (TID) | SUBCUTANEOUS | Status: DC
  Administered 2016-04-16: 1 [IU] via SUBCUTANEOUS
  Administered 2016-04-16: 2 [IU] via SUBCUTANEOUS
  Filled 2016-04-15: qty 2
  Filled 2016-04-15: qty 1

## 2016-04-15 MED ORDER — THIAMINE HCL 100 MG/ML IJ SOLN
100.0000 mg | Freq: Once | INTRAMUSCULAR | Status: AC
Start: 2016-04-15 — End: 2016-04-15
  Administered 2016-04-15: 100 mg via INTRAVENOUS
  Filled 2016-04-15: qty 2

## 2016-04-15 MED ORDER — MIRTAZAPINE 15 MG PO TABS
15.0000 mg | ORAL_TABLET | Freq: Every day | ORAL | Status: DC
  Administered 2016-04-15 – 2016-04-16 (×2): 15 mg via ORAL
  Filled 2016-04-15 (×2): qty 1

## 2016-04-15 MED ORDER — CITALOPRAM HYDROBROMIDE 20 MG PO TABS
20.0000 mg | ORAL_TABLET | Freq: Every day | ORAL | Status: DC
  Administered 2016-04-16 – 2016-04-17 (×2): 20 mg via ORAL
  Filled 2016-04-15 (×2): qty 1

## 2016-04-15 MED ORDER — SODIUM CHLORIDE 0.9 % IV BOLUS (SEPSIS)
1000.0000 mL | Freq: Once | INTRAVENOUS | Status: AC
  Administered 2016-04-15: 1000 mL via INTRAVENOUS

## 2016-04-15 MED ORDER — CARVEDILOL 6.25 MG PO TABS: 6.2500 mg | ORAL_TABLET | Freq: Two times a day (BID) | ORAL | Status: DC

## 2016-04-15 MED ORDER — HYDRALAZINE HCL 20 MG/ML IJ SOLN
10.0000 mg | Freq: Four times a day (QID) | INTRAMUSCULAR | Status: DC | PRN
  Administered 2016-04-15 – 2016-04-16 (×3): 10 mg via INTRAVENOUS
  Filled 2016-04-15 (×2): qty 1

## 2016-04-15 NOTE — ED Notes (Signed)
Called report to [facility] but BP is still too high, will administer PRN Apresoline and monitor BP

## 2016-04-15 NOTE — ED Provider Notes (Addendum)
[facility] Emergency Department Provider Note  ____________________________________________   I have reviewed the triage vital signs and the nursing notes.   HISTORY  Chief Complaint Shaking    HPI Daniel Frye is a 66 y.o. male  With a history of alcohol use states that he has had alcohol withdrawal before. States that he quit drinking today because he was scared of his drinking. Usually drinks a considerable amount of liquor every day. The patient states that he has had no fever or chills or vomiting. He has never had a seizure. He denies hallucinations but he is very shaky and feels unwell. He feels the same as he felt when he last had a history of withdrawal.  Past Medical History:  Diagnosis Date  . CHF (congestive heart failure) ([place])   . COPD (chronic obstructive pulmonary disease) ([place])   . Depression   . Diabetes mellitus without complication ([place])   . Hypertension   . Rheumatoid arteritis 1989   Took shots for awhile but not now  . Substance abuse     Patient Active Problem List   Diagnosis Date Noted  . Hypokalemia 03/03/2016  . Hypomagnesemia 03/03/2016  . Essential hypertension 03/03/2016  . COPD exacerbation ([place]) 03/03/2016  . Acute bronchitis 03/03/2016  . Substance induced mood disorder ([place]) 03/02/2016  . Moderate major depression, single episode ([place]) 03/02/2016  . Alcohol withdrawal ([place]) 02/29/2016  . Crack cocaine use 01/22/2016  . Obstructive sleep apnea of adult 03/30/2015  . Chronic diastolic heart failure ([place]) 03/30/2015  . Edema of foot 03/30/2015  . Alcohol abuse 03/23/2015  . Noncompliance 03/23/2015  . Vitamin D deficiency 01/27/2015  . Diabetes mellitus type 2, controlled, without complications ([place]) 99991111  . Obesity (BMI 30.0-34.9) 11/26/2014  . COPD (chronic obstructive pulmonary disease) ([place]) 06/22/2014  . Essential (primary) hypertension 06/22/2014  . Arthritis or polyarthritis, rheumatoid ([place])  06/22/2014    Past Surgical History:  Procedure Laterality Date  . heart surgery to drain fluid N/A 1987   Heart surgery to remove fluid     Prior to Admission medications   Medication Sig Start Date End Date Taking? Authorizing Provider  albuterol (PROVENTIL) (2.5 MG/3ML) 0.083% nebulizer solution Take 3 mLs (2.5 mg total) by nebulization every 4 (four) hours as needed for wheezing or shortness of breath. 07/14/15 07/13/16  [name], MD  albuterol (VENTOLIN HFA) 108 (90 Base) MCG/ACT inhaler Inhale 2 puffs into the lungs every 4 (four) hours as needed for wheezing or shortness of breath. 09/25/15   Glean Hess, MD  azithromycin (ZITHROMAX) 250 MG tablet Take 1 tablet (250 mg total) by mouth daily. 03/03/16   Theodoro Grist, MD  carvedilol (COREG) 6.25 MG tablet Take 1 tablet (6.25 mg total) by mouth 2 (two) times daily with a meal. 01/22/16   Glean Hess, MD  chlordiazePOXIDE (LIBRIUM) 25 MG capsule Take 1 capsule (25 mg total) by mouth 2 (two) times daily. Please take 1 pill twice daily for 2 days, then 1 pill daily for 2 days, then stop, thank you 03/03/16   Theodoro Grist, MD  Cholecalciferol (VITAMIN D) 2000 UNITS CAPS Take 1 capsule (2,000 Units total) by mouth daily. 01/27/15   [name], MD  feeding supplement (BOOST / RESOURCE BREEZE) LIQD Take 1 Container by mouth 3 (three) times daily between meals. 03/03/16   Theodoro Grist, MD  fluticasone furoate-vilanterol (BREO ELLIPTA) 200-25 MCG/INH AEPB Inhale 1 puff into the lungs daily. 09/25/15   Glean Hess, MD  folic acid (FOLVITE) 1 MG tablet Take 1 tablet (1 mg total) by mouth daily. 03/04/16   Theodoro Grist, MD  furosemide (LASIX) 40 MG tablet Take 1 tablet (40 mg total) by mouth daily. Patient taking differently: Take 40 mg by mouth every other day.  01/22/16   Glean Hess, MD  guaiFENesin (MUCINEX) 600 MG 12 hr tablet Take 1 tablet (600 mg total) by mouth 2 (two) times daily. 03/03/16   Theodoro Grist, MD   Lactase (DAIRY DIGESTIVE SUPPLEMENT PO) Take 1 tablet by mouth 2 (two) times daily. Reported on 07/27/2015    Historical Provider, MD  lisinopril (PRINIVIL,ZESTRIL) 40 MG tablet Take 1 tablet (40 mg total) by mouth daily at 2 PM. 03/03/16   Theodoro Grist, MD  metFORMIN (GLUCOPHAGE) 500 MG tablet Take 1 tablet (500 mg total) by mouth 2 (two) times daily with a meal. 01/27/15   [name], MD  mirtazapine (REMERON) 30 MG tablet Take 30 mg by mouth at bedtime.    Historical Provider, MD  Multiple Vitamin (MULTIVITAMIN WITH MINERALS) TABS tablet Take 1 tablet by mouth daily. 03/04/16   Theodoro Grist, MD  nicotine (NICODERM CQ - DOSED IN MG/24 HOURS) 21 mg/24hr patch Place 1 patch (21 mg total) onto the skin daily. 03/04/16   Theodoro Grist, MD  potassium chloride SA (K-DUR,KLOR-CON) 10 MEQ tablet Take 2 tablets (20 mEq total) by mouth every other day. 03/03/16   Theodoro Grist, MD  thiamine 100 MG tablet Take 1 tablet (100 mg total) by mouth daily. 03/04/16   Theodoro Grist, MD    Allergies Patient has no known allergies.  Family History  Problem Relation Age of Onset  . COPD Brother     Social History Social History  Substance Use Topics  . Smoking status: Former Smoker    Packs/day: 0.33    Years: 50.00    Types: Cigarettes    Quit date: 05/06/2014  . Smokeless tobacco: Never Used  . Alcohol use 9.0 oz/week    15 Glasses of wine per week     Comment: Drinks 1/5 day of wine     Review of Systems {Constitutional: No fever/chills Eyes: No visual changes. ENT: No sore throat. No stiff neck no neck pain Cardiovascular: Denies chest pain. Respiratory: Denies shortness of breath. Gastrointestinal:   no vomiting.  No diarrhea.  No constipation. Genitourinary: Negative for dysuria. Musculoskeletal: Negative lower extremity swelling Skin: Negative for rash. Neurological: Negative for severe headaches, focal weakness or numbness. 10-point ROS otherwise  negative.  ____________________________________________   PHYSICAL EXAM:  VITAL SIGNS: ED Triage Vitals  Enc Vitals Group     BP 04/15/16 1744 (!) 191/118     Pulse Rate 04/15/16 1742 (!) 120     Resp 04/15/16 1742 (!) 22     Temp 04/15/16 1742 98.9 F (37.2 C)     Temp Source 04/15/16 1742 Oral     SpO2 04/15/16 1742 93 %     Weight 04/15/16 1744 180 lb (81.6 kg)     Height --      Head Circumference --      Peak Flow --      Pain Score --      Pain Loc --      Pain Edu? --      Excl. in [place]? --     Constitutional: Alert and oriented. She is somewhat tremulous but otherwise in no medical distress. Eyes: Conjunctivae are normal. PERRL. EOMI. Head: Atraumatic. Nose: No congestion/rhinnorhea.  Mouth/Throat: Mucous membranes are moist.  Oropharynx non-erythematous. Neck: No stridor.   Nontender with no meningismus Cardiovascular: Tachycardia noted, regular rhythm. Grossly normal heart sounds.  Good peripheral circulation. Respiratory: Normal respiratory effort.  No retractions. Lungs CTAB. Abdominal: Soft and nontender. No distention. No guarding no rebound Back:  There is no focal tenderness or step off.  there is no midline tenderness there are no lesions noted. there is no CVA tenderness Musculoskeletal: No lower extremity tenderness, no upper extremity tenderness. No joint effusions, no DVT signs strong distal pulses no edema Neurologic:  Normal speech and language. No gross focal neurologic deficits are appreciated.  diffuse tremor noted Skin:  Skin is warm, dry and intact. No rash noted. Psychiatric: Mood and affect are normal. Speech and behavior are normal.  ____________________________________________   LABS (all labs ordered are listed, but only abnormal results are displayed)  Labs Reviewed  ACETAMINOPHEN LEVEL  COMPREHENSIVE METABOLIC PANEL  ETHANOL  CBC WITH DIFFERENTIAL/PLATELET  TROPONIN I  URINALYSIS, COMPLETE (UACMP) WITH MICROSCOPIC  URINE DRUG  SCREEN, QUALITATIVE ([facility] ONLY)   ____________________________________________  EKG  I personally interpreted any EKGs ordered by me or triage Sinus tachycardia rate 125 no acute ST elevation or acute ST depression ____________________________________________  RADIOLOGY  I reviewed any imaging ordered by me or triage that were performed during my shift and, if possible, patient and/or family made aware of any abnormal findings. ____________________________________________   PROCEDURES  Procedure(s) performed: None  Procedures  Critical Care performed: None  ____________________________________________   INITIAL IMPRESSION / ASSESSMENT AND PLAN / ED COURSE  Pertinent labs & imaging results that were available during my care of the patient were reviewed by me and considered in my medical decision making   patient with a history of alcohol withdrawal presents with signs and symptoms of a cold for all. Last echo was yesterday morning. We will give him Ativan, we will give him IV fluid and we'll watch him closely. We'll check requisite blood work and hydrate him we will give him thiamine and folate.  ----------------------------------------- 6:31 PM on 04/15/2016 -----------------------------------------  After IM Ativan, as we did not initially have access, patient's heart rate is still somewhat elevated 119 but his shakes or much better and he is much more calm in appearance.   ----------------------------------------- 7:05 PM on 04/15/2016 -----------------------------------------  Hr 112, pt sleeping pressures improving. Awaiting cmp from lab. Replacing magnesium, admitting.   FINAL CLINICAL IMPRESSION(S) / ED DIAGNOSES  Final diagnoses:  None      This chart was dictated using voice recognition software.  Despite best efforts to proofread,  errors can occur which can change meaning.      Schuyler Amor, MD 04/15/16 [place], MD 04/15/16  1806    Schuyler Amor, MD 04/15/16 [place], MD 04/15/16 [place], MD 04/15/16 2132

## 2016-04-15 NOTE — H&P (Signed)
[facility] [place] NAME: Daniel Frye    MR#:  [id number]  DATE OF BIRTH:  October 24, 1950  DATE OF ADMISSION:  04/15/2016  PRIMARY CARE PHYSICIAN: [name], MD   REQUESTING/REFERRING PHYSICIAN: [place]  CHIEF COMPLAINT:   Chief Complaint  Patient presents with  . Shaking    HISTORY OF PRESENT ILLNESS: Daniel Frye  is a 66 y.o. male with a known history of CHF, COPD, depression, diabetes, hypertension, rheumatoid arthritis, substance abuse, chronic alcoholism- said he last drink was yesterday morning and since then he did not drink. Today morning he started having excessive tremors and shaking- so came to emergency room.  Found to be in alcohol withdrawal. He had previous admissions for the same reason.  PAST MEDICAL HISTORY:   Past Medical History:  Diagnosis Date  . CHF (congestive heart failure) ([place])   . COPD (chronic obstructive pulmonary disease) ([place])   . Depression   . Diabetes mellitus without complication ([place])   . Hypertension   . Rheumatoid arteritis 1989   Took shots for awhile but not now  . Substance abuse     PAST SURGICAL HISTORY: Past Surgical History:  Procedure Laterality Date  . heart surgery to drain fluid N/A 1987   Heart surgery to remove fluid     SOCIAL HISTORY:  Social History  Substance Use Topics  . Smoking status: Former Smoker    Packs/day: 0.33    Years: 50.00    Types: Cigarettes    Quit date: 05/06/2014  . Smokeless tobacco: Never Used  . Alcohol use 9.0 oz/week    15 Glasses of wine per week     Comment: Drinks 1/5 day of wine     FAMILY HISTORY:  Family History  Problem Relation Age of Onset  . COPD Brother     DRUG ALLERGIES: No Known Allergies  REVIEW OF SYSTEMS:   CONSTITUTIONAL: No fever, fatigue or weakness.  EYES: No blurred or double vision.  EARS, NOSE, AND THROAT: No tinnitus or ear pain.  RESPIRATORY: No cough, shortness of breath, wheezing or hemoptysis.   CARDIOVASCULAR: No chest pain, orthopnea, edema.  GASTROINTESTINAL: No nausea, vomiting, diarrhea or abdominal pain.  GENITOURINARY: No dysuria, hematuria.  ENDOCRINE: No polyuria, nocturia,  HEMATOLOGY: No anemia, easy bruising or bleeding SKIN: No rash or lesion. MUSCULOSKELETAL: No joint pain or arthritis.   NEUROLOGIC: No tingling, numbness, weakness. He had shaking. PSYCHIATRY: No anxiety or depression.   MEDICATIONS AT HOME:  Prior to Admission medications   Medication Sig Start Date End Date Taking? Authorizing Provider  citalopram (CELEXA) 20 MG tablet Take 20 mg by mouth daily.   Yes Historical Provider, MD  cyclobenzaprine (FLEXERIL) 10 MG tablet Take 10 mg by mouth 3 (three) times daily as needed for muscle spasms.   Yes Historical Provider, MD  folic acid (FOLVITE) 1 MG tablet Take 1 tablet (1 mg total) by mouth daily. 03/04/16  Yes Theodoro Grist, MD  furosemide (LASIX) 40 MG tablet Take 1 tablet (40 mg total) by mouth daily. Patient taking differently: Take 20 mg by mouth daily.  01/22/16  Yes Glean Hess, MD  mirtazapine (REMERON) 15 MG tablet Take 15 mg by mouth at bedtime.    Yes Historical Provider, MD  Multiple Vitamin (MULTIVITAMIN WITH MINERALS) TABS tablet Take 1 tablet by mouth daily. 03/04/16  Yes Theodoro Grist, MD  thiamine 100 MG tablet Take 1 tablet (100 mg total) by mouth daily. 03/04/16  Yes  Theodoro Grist, MD  albuterol (PROVENTIL) (2.5 MG/3ML) 0.083% nebulizer solution Take 3 mLs (2.5 mg total) by nebulization every 4 (four) hours as needed for wheezing or shortness of breath. 07/14/15 07/13/16  [name], MD  albuterol (VENTOLIN HFA) 108 (90 Base) MCG/ACT inhaler Inhale 2 puffs into the lungs every 4 (four) hours as needed for wheezing or shortness of breath. 09/25/15   Glean Hess, MD  carvedilol (COREG) 6.25 MG tablet Take 1 tablet (6.25 mg total) by mouth 2 (two) times daily with a meal. Patient not taking: Reported on 04/15/2016 01/22/16   Glean Hess, MD  chlordiazePOXIDE (LIBRIUM) 25 MG capsule Take 1 capsule (25 mg total) by mouth 2 (two) times daily. Please take 1 pill twice daily for 2 days, then 1 pill daily for 2 days, then stop, thank you 03/03/16   Theodoro Grist, MD  Cholecalciferol (VITAMIN D) 2000 UNITS CAPS Take 1 capsule (2,000 Units total) by mouth daily. 01/27/15   [name], MD  feeding supplement (BOOST / RESOURCE BREEZE) LIQD Take 1 Container by mouth 3 (three) times daily between meals. 03/03/16   Theodoro Grist, MD  fluticasone furoate-vilanterol (BREO ELLIPTA) 200-25 MCG/INH AEPB Inhale 1 puff into the lungs daily. 09/25/15   Glean Hess, MD  guaiFENesin (MUCINEX) 600 MG 12 hr tablet Take 1 tablet (600 mg total) by mouth 2 (two) times daily. 03/03/16   Theodoro Grist, MD  Lactase (DAIRY DIGESTIVE SUPPLEMENT PO) Take 1 tablet by mouth 2 (two) times daily. Reported on 07/27/2015    Historical Provider, MD  lisinopril (PRINIVIL,ZESTRIL) 40 MG tablet Take 1 tablet (40 mg total) by mouth daily at 2 PM. 03/03/16   Theodoro Grist, MD  metFORMIN (GLUCOPHAGE) 500 MG tablet Take 1 tablet (500 mg total) by mouth 2 (two) times daily with a meal. 01/27/15   [name], MD  nicotine (NICODERM CQ - DOSED IN MG/24 HOURS) 21 mg/24hr patch Place 1 patch (21 mg total) onto the skin daily. 03/04/16   Theodoro Grist, MD  potassium chloride SA (K-DUR,KLOR-CON) 10 MEQ tablet Take 2 tablets (20 mEq total) by mouth every other day. 03/03/16   Theodoro Grist, MD      PHYSICAL EXAMINATION:   VITAL SIGNS: Blood pressure (!) 191/118, pulse (!) 120, temperature 98.9 F (37.2 C), temperature source Oral, resp. rate (!) 22, weight 81.6 kg (180 lb), SpO2 93 %.  GENERAL:  66 y.o.-year-old patient lying in the bed with no acute distress.  EYES: Pupils equal, round, reactive to light and accommodation. No scleral icterus. Extraocular muscles intact.  HEENT: Head atraumatic, normocephalic. Oropharynx and nasopharynx clear.  NECK:  Supple, no  jugular venous distention. No thyroid enlargement, no tenderness.  LUNGS: Normal breath sounds bilaterally, no wheezing, rales,rhonchi or crepitation. No use of accessory muscles of respiration.  CARDIOVASCULAR: S1, S2 normal. No murmurs, rubs, or gallops.  ABDOMEN: Soft, nontender, nondistended. Bowel sounds present. No organomegaly or mass.  EXTREMITIES: No pedal edema, cyanosis, or clubbing.  NEUROLOGIC: Cranial nerves II through XII are intact. Muscle strength 4/5 in all extremities. Sensation intact. Gait not checked. He has shaking/tremors on his arms present. PSYCHIATRIC: The patient is alert and oriented x 3, appears having some agitation.  SKIN: No obvious rash, lesion, or ulcer.   LABORATORY PANEL:   CBC  Recent Labs Lab 04/15/16 1802  WBC 6.6  HGB 15.2  HCT 44.7  PLT 179  MCV 95.2  MCH 32.3  MCHC 33.9  RDW 16.3*  LYMPHSABS 0.6*  MONOABS 0.2  EOSABS 0.0  BASOSABS 0.0   ------------------------------------------------------------------------------------------------------------------  Chemistries   Recent Labs Lab 04/15/16 1802  NA 137  K 3.6  CL 100*  CO2 23  GLUCOSE 127*  BUN 10  CREATININE 1.07  CALCIUM 9.2  MG 1.4*  AST 46*  ALT 29  ALKPHOS 118  BILITOT 0.7   ------------------------------------------------------------------------------------------------------------------ estimated creatinine clearance is 67 mL/min (by C-G formula based on SCr of 1.07 mg/dL). ------------------------------------------------------------------------------------------------------------------ No results for input(s): TSH, T4TOTAL, T3FREE, THYROIDAB in the last 72 hours.  Invalid input(s): FREET3   Coagulation profile No results for input(s): INR, PROTIME in the last 168 hours. ------------------------------------------------------------------------------------------------------------------- No results for input(s): DDIMER in the last 72  hours. -------------------------------------------------------------------------------------------------------------------  Cardiac Enzymes  Recent Labs Lab 04/15/16 1802  TROPONINI <0.03   ------------------------------------------------------------------------------------------------------------------ Invalid input(s): POCBNP  ---------------------------------------------------------------------------------------------------------------  Urinalysis    Component Value Date/Time   COLORURINE STRAW (A) 04/15/2016 1802   APPEARANCEUR CLEAR (A) 04/15/2016 1802   LABSPEC 1.011 04/15/2016 1802   PHURINE 6.0 04/15/2016 1802   GLUCOSEU 50 (A) 04/15/2016 1802   HGBUR NEGATIVE 04/15/2016 1802   BILIRUBINUR NEGATIVE 04/15/2016 1802   KETONESUR 5 (A) 04/15/2016 1802   PROTEINUR 100 (A) 04/15/2016 1802   NITRITE NEGATIVE 04/15/2016 1802   LEUKOCYTESUR NEGATIVE 04/15/2016 1802     RADIOLOGY: No results found.  EKG: Orders placed or performed during the hospital encounter of 04/15/16  . ED EKG  . ED EKG    IMPRESSION AND PLAN:  * Alcohol withdrawal   Hypertension uncontrolled   Tachycardia    IV fluids, thiamine and folic acid, Ativan as CIWA protocol.   Monitor on telemetry.  * Uncontrolled hypertension   Hydralazine injection as needed.  * COPD   Currently no wheezing, keep on inhalers and given nebulizer treatments.  * Diabetes   Keep on sliding scale coverage, no oral medication as patient will not have good oral intake for a few days.  * Depression   Continue home medication.  * Hypomagnesemia   ER physician ordered a replacement 1 dose.  * Active smoking    Counseled to quit smoking for 4 minutes and offered nicotine patch.   All the records are reviewed and case discussed with ED provider. Management plans discussed with the patient, family and they are in agreement.  CODE STATUS: Code Status History    Date Active Date Inactive Code Status Order ID  Comments User Context   02/29/2016  7:59 PM 03/04/2016  1:22 AM Full Code [id number]  Vaughan Basta, MD Inpatient       TOTAL TIME TAKING CARE OF THIS PATIENT: 50 minutes.    Vaughan Basta M.D on 04/15/2016   Between 7am to 6pm - Pager - [phone]  After 6pm go to [email or web address] - password [email or web address] [facility]  Office  [phone]  CC: Primary care physician; [name], MD   Note: This dictation was prepared with Dragon dictation along with smaller phrase technology. Any transcriptional errors that result from this process are unintentional.

## 2016-04-15 NOTE — ED Triage Notes (Signed)
Pt c/o shaking all over that started today. Denies pain. Pt reports they fixed him last time. Pt drinks about 1/5 whiskey per day. Last drink yesterday lunch time. Has had some sweating per pt. Difficulty getting accurate story. Denies headache but feels "like my voice is inside my body". Have trouble holding still.

## 2016-04-15 NOTE — ED Notes (Signed)
Called pharmacy to release patient meds so I can do the floor orders without having to manually override all of them

## 2016-04-16 LAB — BASIC METABOLIC PANEL
Anion gap: 8 (ref 5–15)
BUN: 8 mg/dL (ref 6–20)
CO2: 27 mmol/L (ref 22–32)
Calcium: 8.6 mg/dL — ABNORMAL LOW (ref 8.9–10.3)
Chloride: 103 mmol/L (ref 101–111)
Creatinine, Ser: 1.04 mg/dL (ref 0.61–1.24)
GFR calc Af Amer: >60 mL/min (ref >60)
GFR calc non Af Amer: >60 mL/min (ref >60)
Glucose, Bld: 106 mg/dL — ABNORMAL HIGH (ref 65–99)
Potassium: 3.1 mmol/L — ABNORMAL LOW (ref 3.5–5.1)
Sodium: 138 mmol/L (ref 135–145)

## 2016-04-16 LAB — GLUCOSE, CAPILLARY
Glucose-Capillary: 122 mg/dL — ABNORMAL HIGH (ref 65–99)
Glucose-Capillary: 137 mg/dL — ABNORMAL HIGH (ref 65–99)
Glucose-Capillary: 157 mg/dL — ABNORMAL HIGH (ref 65–99)
Glucose-Capillary: 109 mg/dL — ABNORMAL HIGH (ref 65–99)

## 2016-04-16 LAB — CBC
HCT: 41.8 % (ref 40.0–52.0)
Hemoglobin: 14.3 g/dL (ref 13.0–18.0)
MCH: 32.7 pg (ref 26.0–34.0)
MCHC: 34.2 g/dL (ref 32.0–36.0)
MCV: 95.6 fL (ref 80.0–100.0)
Platelets: 143 10*3/uL — ABNORMAL LOW (ref 150–440)
RBC: 4.38 MIL/uL — ABNORMAL LOW (ref 4.40–5.90)
RDW: 15.9 % — ABNORMAL HIGH (ref 11.5–14.5)
WBC: 8 10*3/uL (ref 3.8–10.6)

## 2016-04-16 LAB — MAGNESIUM: Magnesium: 1.8 mg/dL (ref 1.7–2.4)

## 2016-04-16 MED ORDER — POTASSIUM CHLORIDE CRYS ER 20 MEQ PO TBCR
40.0000 meq | EXTENDED_RELEASE_TABLET | Freq: Once | ORAL | Status: AC
  Administered 2016-04-16: 40 meq via ORAL
  Filled 2016-04-16: qty 2

## 2016-04-16 MED ORDER — AMLODIPINE BESYLATE 10 MG PO TABS
10.0000 mg | ORAL_TABLET | Freq: Every day | ORAL | Status: DC
  Administered 2016-04-16 – 2016-04-17 (×2): 10 mg via ORAL
  Filled 2016-04-16 (×2): qty 1

## 2016-04-16 MED ORDER — FUROSEMIDE 40 MG PO TABS
40.0000 mg | ORAL_TABLET | Freq: Every day | ORAL | Status: DC
  Administered 2016-04-16 – 2016-04-17 (×2): 40 mg via ORAL
  Filled 2016-04-16 (×2): qty 1

## 2016-04-16 MED ORDER — IPRATROPIUM-ALBUTEROL 0.5-2.5 (3) MG/3ML IN SOLN
3.0000 mL | Freq: Four times a day (QID) | RESPIRATORY_TRACT | Status: DC | PRN
  Administered 2016-04-16: 3 mL via RESPIRATORY_TRACT
  Filled 2016-04-16: qty 3

## 2016-04-16 NOTE — Progress Notes (Signed)
Pt complains of SOB. VS 144/83, HR 120, O2 99%. Paged MD. Per Dr. Bridgett Larsson, continue to monitor, no new orders at this time.

## 2016-04-16 NOTE — Progress Notes (Signed)
[facility] [place] NAME: Daniel Frye    MR#:  [id number]  DATE OF BIRTH:  Jul 28, 1950  SUBJECTIVE:  CHIEF COMPLAINT:   Chief Complaint  Patient presents with  . Shaking   Better shaking. REVIEW OF SYSTEMS:  Review of Systems  Constitutional: Positive for malaise/fatigue. Negative for fever.  HENT: Negative for congestion.   Eyes: Negative for blurred vision and double vision.  Respiratory: Negative for cough, shortness of breath, wheezing and stridor.   Cardiovascular: Negative for chest pain and leg swelling.  Gastrointestinal: Negative for abdominal pain, blood in stool, diarrhea, melena, nausea and vomiting.  Genitourinary: Negative for dysuria and hematuria.  Musculoskeletal: Negative for back pain.  Skin: Negative for itching and rash.  Neurological: Positive for tremors and weakness. Negative for dizziness, tingling, focal weakness, loss of consciousness and headaches.  Psychiatric/Behavioral: Negative for depression. The patient is not nervous/anxious.     DRUG ALLERGIES:  No Known Allergies VITALS:  Blood pressure (!) 170/102, pulse 98, temperature 98.3 F (36.8 C), temperature source Oral, resp. rate 20, height 5\' 10"  (1.778 m), weight 187 lb (84.8 kg), SpO2 91 %. PHYSICAL EXAMINATION:  Physical Exam  Constitutional: He is oriented to person, place, and time and well-developed, well-nourished, and in no distress.  HENT:  Head: Normocephalic.  Mouth/Throat: Oropharynx is clear and moist.  Eyes: Conjunctivae and EOM are normal. No scleral icterus.  Neck: Normal range of motion. Neck supple. No JVD present. No thyromegaly present.  Cardiovascular: Normal rate, regular rhythm and normal heart sounds.  Exam reveals no gallop.   No murmur heard. Pulmonary/Chest: Effort normal and breath sounds normal. No respiratory distress. He has no wheezes.  Abdominal: Soft. Bowel sounds are normal. He exhibits no distension. There is  no tenderness.  Musculoskeletal: Normal range of motion. He exhibits no edema or tenderness.  Neurological: He is alert and oriented to person, place, and time. No cranial nerve deficit.  Skin: No rash noted. No erythema.  Psychiatric: Affect normal.   LABORATORY PANEL:   CBC  Recent Labs Lab 04/16/16 0407  WBC 8.0  HGB 14.3  HCT 41.8  PLT 143*   ------------------------------------------------------------------------------------------------------------------ Chemistries   Recent Labs Lab 04/15/16 1802 04/16/16 0407  NA 137 138  K 3.6 3.1*  CL 100* 103  CO2 23 27  GLUCOSE 127* 106*  BUN 10 8  CREATININE 1.07 1.04  CALCIUM 9.2 8.6*  MG 1.4* 1.8  AST 46*  --   ALT 29  --   ALKPHOS 118  --   BILITOT 0.7  --    RADIOLOGY:  No results found. ASSESSMENT AND PLAN:   * Alcohol withdrawal   Hypertension uncontrolled   Tachycardia is better  continue thiamine and folic acid, Ativan as CIWA protocol.  * Accelerated hypertension    continue lisinopril and lasix, and add Norvasc, Hydralazine injection as needed.  * COPD   Continue inhalers and given nebulizer treatments.  * Diabetes   Keep on sliding scale coverage.  * Depression   Continue home medication.  * Hypomagnesemia   ER physician ordered a replacement 1 dose. Improved.  Hypokalemia. Give potassium supplement and follow-up BMP.  * Tobacco abuse   Counseled again for smoking cessation for 4 minutes on nicotine patch.  * Cocaine abuse. Cessation was counseled. Discontinue Coreg.  All the records are reviewed and case discussed with Care Management/Social Worker. Management plans discussed with the patient, family and they are in agreement.  CODE STATUS: Full code  TOTAL TIME TAKING CARE OF THIS PATIENT: 37 minutes.   More than 50% of the time was spent in counseling/coordination of care: YES  POSSIBLE D/C IN 2 DAYS, DEPENDING ON CLINICAL CONDITION.   Demetrios Loll M.D on 04/16/2016 at  2:01 PM  Between 7am to 6pm - Pager - [phone]  After 6pm go to [email or web address] - Proofreader  [facility]  Office  [phone]  CC: Primary care physician; [name], MD  Note: This dictation was prepared with Dragon dictation along with smaller phrase technology. Any transcriptional errors that result from this process are unintentional.

## 2016-04-16 NOTE — Progress Notes (Signed)
Chaplain provided education about an Advance Directives to the patient. Patient will read the document, fill it out and ask his nurse to call a chaplain who will initiate all required to make the AD official.

## 2016-04-16 NOTE — Progress Notes (Signed)
Physical Therapy Evaluation Patient Details Name: Daniel Frye MRN: [id number] DOB: 05-08-1950 Today's Date: 04/16/2016   History of Present Illness  Patient is a 66 y.o. male admitted on 09 FEB after experiencing tremors and alcohol withdrawal. PMH includes CHF, COPD, depression, HTN, DM, RA, and substance abuse.  Clinical Impression  Patient is a pleasant male admitted for above reasons. Patient previously independent with household distances and ADLs, living with sister who assists with driving/buying groceries. Patient admits to history of falls, reportedly only occurring when intoxicated. Patient has access to [facility] and [facility] but admittedly hasn't used them. Upon evaluation, patient demonstrates independence with bed mobility and transfers. When assessing dynamic balance in standing, patient demonstrates inability to perform SLS without HHA and decreased ability to stabilize with perturbances, having tendency to reach for wall/furniture. Patient able to ambulate with mild unsteadiness and HHA. Because patient has history of falls, it was recommended that he begin to utilize [facility] in future upon discharge. Patient will benefit from progressive dynamic balance training during his hospital stay to prevent falls and readmissions in the future.    Follow Up Recommendations No PT follow up    Equipment Recommendations  None recommended by PT;Other (comment) (Patient has SPC)    Recommendations for Other Services       Precautions / Restrictions Precautions Precautions: Fall Restrictions Weight Bearing Restrictions: No      Mobility  Bed Mobility Overal bed mobility: Independent             General bed mobility comments: Patient performed bed mobility independently.  Transfers Overall transfer level: Independent Equipment used: None             General transfer comment: Patient moves from sit to stand and stand to sit independently.  Ambulation/Gait Ambulation/Gait assistance:  Min guard Ambulation Distance (Feet): 40 Feet Assistive device: 1 person hand held assist       General Gait Details: Patient ambulates at decreased cadence with mild unsteadiness with HHA. No LOB noted, but patient has tendency to grab walls/furniture.  Stairs            Wheelchair Mobility    Modified Rankin (Stroke Patients Only)       Balance Overall balance assessment: Needs assistance;History of Falls Sitting-balance support: Feet supported Sitting balance-Leahy Scale: Good     Standing balance support: No upper extremity supported Standing balance-Leahy Scale: Fair   Single Leg Stance - Right Leg: 0 (Can perform with HHA) Single Leg Stance - Left Leg: 0 (Can perform with HHA)                         Pertinent Vitals/Pain Pain Assessment: No/denies pain    Home Living Family/patient expects to be discharged to:: Private residence Living Arrangements: Other relatives;Other (Comment) (Sister) Available Help at Discharge: Family;Available PRN/intermittently Type of Home: House Home Access: Stairs to enter Entrance Stairs-Rails: None Entrance Stairs-Number of Steps: 2 Home Layout: One level Home Equipment: Walker - 2 wheels;Cane - single point Additional Comments: Patient reports having but not using AD.    Prior Function Level of Independence: Needs assistance   Gait / Transfers Assistance Needed: Performed household distances independently  ADL's / Homemaking Assistance Needed: Performed by sister  Comments: Sister/Brother drove patient to appointments     Hand Dominance        Extremity/Trunk Assessment   Upper Extremity Assessment Upper Extremity Assessment: Overall WFL for tasks assessed;Generalized weakness    Lower Extremity  Assessment Lower Extremity Assessment: Overall WFL for tasks assessed;Generalized weakness       Communication   Communication: No difficulties  Cognition Arousal/Alertness: Awake/alert Behavior  During Therapy: WFL for tasks assessed/performed Overall Cognitive Status: Within Functional Limits for tasks assessed                      General Comments      Exercises     Assessment/Plan    PT Assessment Patient needs continued PT services  PT Problem List Decreased strength;Decreased activity tolerance;Decreased balance;Decreased mobility;Decreased knowledge of use of DME;Decreased safety awareness          PT Treatment Interventions DME instruction;Gait training;Stair training;Functional mobility training;Therapeutic activities;Therapeutic exercise;Balance training;Patient/family education    PT Goals (Current goals can be found in the Care Plan section)  Acute Rehab PT Goals Patient Stated Goal: "To feel better" PT Goal Formulation: With patient Time For Goal Achievement: 04/30/16 Potential to Achieve Goals: Good    Frequency Min 2X/week   Barriers to discharge        Co-evaluation               End of Session Equipment Utilized During Treatment: Gait belt Activity Tolerance: Patient tolerated treatment well Patient left: in bed;with call bell/phone within reach;with bed alarm set           Time: 1440-1453 PT Time Calculation (min) (ACUTE ONLY): 13 min   Charges:   PT Evaluation $PT Eval Low Complexity: 1 Procedure     PT G Codes:        Dorice Lamas, PT, DPT 04/16/2016, 3:37 PM

## 2016-04-17 LAB — POTASSIUM
Potassium: 2.9 mmol/L — ABNORMAL LOW (ref 3.5–5.1)
Potassium: 3.7 mmol/L (ref 3.5–5.1)

## 2016-04-17 LAB — GLUCOSE, CAPILLARY
Glucose-Capillary: 113 mg/dL — ABNORMAL HIGH (ref 65–99)
Glucose-Capillary: 109 mg/dL — ABNORMAL HIGH (ref 65–99)

## 2016-04-17 MED ORDER — POTASSIUM CHLORIDE CRYS ER 20 MEQ PO TBCR
40.0000 meq | EXTENDED_RELEASE_TABLET | Freq: Once | ORAL | Status: AC
  Administered 2016-04-17: 40 meq via ORAL
  Filled 2016-04-17: qty 2

## 2016-04-17 NOTE — Discharge Summary (Signed)
[facility] NAME: Daniel Frye    MR#:  [id number]  DATE OF BIRTH:  1950-07-08  DATE OF ADMISSION:  04/15/2016   ADMITTING PHYSICIAN: Vaughan Basta, MD  DATE OF DISCHARGE: 04/17/2016 12:35 PM  PRIMARY CARE PHYSICIAN: [name], MD   ADMISSION DIAGNOSIS:  Alcohol withdrawal syndrome without complication ([place]) [id number] DISCHARGE DIAGNOSIS:  Principal Problem:   Alcohol withdrawal ([place]) Active Problems:   Hypomagnesemia  SECONDARY DIAGNOSIS:   Past Medical History:  Diagnosis Date  . CHF (congestive heart failure) ([place])   . COPD (chronic obstructive pulmonary disease) ([place])   . Depression   . Diabetes mellitus without complication ([place])   . Hypertension   . Rheumatoid arteritis 1989   Took shots for awhile but not now  . Substance abuse    HOSPITAL COURSE:  * Alcohol withdrawal Improved.  continue thiamine and folic acid, Ativan as CIWA protocol.  * Accelerated hypertension  continue lisinopril and lasix, and add Norvasc, Hydralazine injection as needed. Improved.  * COPD Continue inhalers and given nebulizer treatments.  * Diabetes Keep on sliding scale coverage.  * Depression Continue home medication.  * Hypomagnesemia ER physician ordered a replacement 1 dose. Improved.  Hypokalemia.improved with potassium supplement.  * Tobacco abuse Counseled again for smoking cessation for 4 minutes on nicotine patch.  * Cocaine abuse. Cessation was counseled.  DISCHARGE CONDITIONS:  Stable, discharged to home today. CONSULTS OBTAINED:   DRUG ALLERGIES:  No Known Allergies DISCHARGE MEDICATIONS:   Allergies as of 04/17/2016   No Known Allergies     Medication List    TAKE these medications   albuterol (2.5 MG/3ML) 0.083% nebulizer solution Commonly known as:  PROVENTIL Take 3 mLs (2.5 mg total) by nebulization every 4 (four) hours as needed for wheezing or shortness  of breath.   albuterol 108 (90 Base) MCG/ACT inhaler Commonly known as:  VENTOLIN HFA Inhale 2 puffs into the lungs every 4 (four) hours as needed for wheezing or shortness of breath.   carvedilol 6.25 MG tablet Commonly known as:  COREG Take 1 tablet (6.25 mg total) by mouth 2 (two) times daily with a meal.   chlordiazePOXIDE 25 MG capsule Commonly known as:  LIBRIUM Take 1 capsule (25 mg total) by mouth 2 (two) times daily. Please take 1 pill twice daily for 2 days, then 1 pill daily for 2 days, then stop, thank you   citalopram 20 MG tablet Commonly known as:  CELEXA Take 20 mg by mouth daily.   cyclobenzaprine 10 MG tablet Commonly known as:  FLEXERIL Take 10 mg by mouth 3 (three) times daily as needed for muscle spasms.   DAIRY DIGESTIVE SUPPLEMENT PO Take 1 tablet by mouth 2 (two) times daily. Reported on 07/27/2015   feeding supplement Liqd Take 1 Container by mouth 3 (three) times daily between meals.   fluticasone furoate-vilanterol 200-25 MCG/INH Aepb Commonly known as:  BREO ELLIPTA Inhale 1 puff into the lungs daily.   folic acid 1 MG tablet Commonly known as:  FOLVITE Take 1 tablet (1 mg total) by mouth daily.   furosemide 40 MG tablet Commonly known as:  LASIX Take 1 tablet (40 mg total) by mouth daily. What changed:  how much to take   guaiFENesin 600 MG 12 hr tablet Commonly known as:  MUCINEX Take 1 tablet (600 mg total) by mouth 2 (two) times daily.   lisinopril 40 MG tablet Commonly known as:  PRINIVIL,ZESTRIL  Take 1 tablet (40 mg total) by mouth daily at 2 PM.   metFORMIN 500 MG tablet Commonly known as:  GLUCOPHAGE Take 1 tablet (500 mg total) by mouth 2 (two) times daily with a meal.   mirtazapine 15 MG tablet Commonly known as:  REMERON Take 15 mg by mouth at bedtime.   multivitamin with minerals Tabs tablet Take 1 tablet by mouth daily.   nicotine 21 mg/24hr patch Commonly known as:  NICODERM CQ - dosed in mg/24 hours Place 1 patch  (21 mg total) onto the skin daily.   potassium chloride 10 MEQ tablet Commonly known as:  K-DUR,KLOR-CON Take 2 tablets (20 mEq total) by mouth every other day.   thiamine 100 MG tablet Take 1 tablet (100 mg total) by mouth daily.   Vitamin D 2000 units Caps Take 1 capsule (2,000 Units total) by mouth daily.        DISCHARGE INSTRUCTIONS:  See AVS.  If you experience worsening of your admission symptoms, develop shortness of breath, life threatening emergency, suicidal or homicidal thoughts you must seek medical attention immediately by calling 911 or calling your MD immediately  if symptoms less severe.  You Must read complete instructions/literature along with all the possible adverse reactions/side effects for all the Medicines you take and that have been prescribed to you. Take any new Medicines after you have completely understood and accpet all the possible adverse reactions/side effects.   Please note  You were cared for by a hospitalist during your hospital stay. If you have any questions about your discharge medications or the care you received while you were in the hospital after you are discharged, you can call the unit and asked to speak with the hospitalist on call if the hospitalist that took care of you is not available. Once you are discharged, your primary care physician will handle any further medical issues. Please note that NO REFILLS for any discharge medications will be authorized once you are discharged, as it is imperative that you return to your primary care physician (or establish a relationship with a primary care physician if you do not have one) for your aftercare needs so that they can reassess your need for medications and monitor your lab values.    On the day of Discharge:  VITAL SIGNS:  Blood pressure 135/85, pulse (!) 101, temperature 98.5 F (36.9 C), temperature source Oral, resp. rate 19, height 5\' 10"  (1.778 m), weight 187 lb (84.8 kg), SpO2 100  %. PHYSICAL EXAMINATION:  GENERAL:  66 y.o.-year-old patient lying in the bed with no acute distress.  EYES: Pupils equal, round, reactive to light and accommodation. No scleral icterus. Extraocular muscles intact.  HEENT: Head atraumatic, normocephalic. Oropharynx and nasopharynx clear.  NECK:  Supple, no jugular venous distention. No thyroid enlargement, no tenderness.  LUNGS: Normal breath sounds bilaterally, no wheezing, rales,rhonchi or crepitation. No use of accessory muscles of respiration.  CARDIOVASCULAR: S1, S2 normal. No murmurs, rubs, or gallops.  ABDOMEN: Soft, non-tender, non-distended. Bowel sounds present. No organomegaly or mass.  EXTREMITIES: No pedal edema, cyanosis, or clubbing.  NEUROLOGIC: Cranial nerves II through XII are intact. Muscle strength 5/5 in all extremities. Sensation intact. Gait not checked.  PSYCHIATRIC: The patient is alert and oriented x 3.  SKIN: No obvious rash, lesion, or ulcer.  DATA REVIEW:   CBC  Recent Labs Lab 04/16/16 0407  WBC 8.0  HGB 14.3  HCT 41.8  PLT 143*    Chemistries   Recent  Labs Lab 04/15/16 1802 04/16/16 0407  04/17/16 1211  NA 137 138  --   --   K 3.6 3.1*  < > 3.7  CL 100* 103  --   --   CO2 23 27  --   --   GLUCOSE 127* 106*  --   --   BUN 10 8  --   --   CREATININE 1.07 1.04  --   --   CALCIUM 9.2 8.6*  --   --   MG 1.4* 1.8  --   --   AST 46*  --   --   --   ALT 29  --   --   --   ALKPHOS 118  --   --   --   BILITOT 0.7  --   --   --   < > = values in this interval not displayed.   Microbiology Results  No results found for this or any previous visit.  RADIOLOGY:  No results found.   Management plans discussed with the patient, sister and they are in agreement.  CODE STATUS:     Code Status Orders        Start     Ordered   04/15/16 2136  Full code  Continuous     04/15/16 2135    Code Status History    Date Active Date Inactive Code Status Order ID Comments User Context    02/29/2016  7:59 PM 03/04/2016  1:22 AM Full Code [id number]  Vaughan Basta, MD Inpatient      TOTAL TIME TAKING CARE OF THIS PATIENT: 33 minutes.    Demetrios Loll M.D on 04/17/2016 at 3:05 PM  Between 7am to 6pm - Pager - [phone]  After 6pm go to [email or web address] - Proofreader  [facility]  Office  [phone]  CC: Primary care physician; [name], MD   Note: This dictation was prepared with Dragon dictation along with smaller phrase technology. Any transcriptional errors that result from this process are unintentional.

## 2016-04-17 NOTE — Progress Notes (Signed)
Patient discharge summary reviewed with understanding. Called sister to alert to discharge, stated she would make arrangements for transport. VSS at this time.

## 2016-04-17 NOTE — Care Management Note (Signed)
Case Management Note  Patient Details  Name: Coran Tardie MRN: [id number] Date of Birth: 10-23-1950  Subjective/Objective:     Updated weekend CSW per consult to CM for outpatient detox.                Action/Plan:   Expected Discharge Date:  04/17/16               Expected Discharge Plan:     In-House Referral:     Discharge planning Services     Post Acute Care Choice:    Choice offered to:     DME Arranged:    DME Agency:     HH Arranged:    HH Agency:     Status of Service:     If discussed at [employer], dates discussed:    Additional Comments:  Vianna Venezia A, RN 04/17/2016, 10:26 AM

## 2016-04-17 NOTE — Discharge Instructions (Signed)
Alcohol detox program Smoking and drug abuse cessation. Heart healthy diet.

## 2016-04-17 NOTE — Progress Notes (Signed)
Pts. Potassium level is 2.9 this AM. Dr. Marcille Blanco reordered potassium 40 meq once

## 2016-04-17 NOTE — Clinical Social Work Note (Signed)
CSW attempted to visit patient to discuss outpatient resources. The patient would not rouse from sleep with raised voice or shaking. The RN is aware and has the resources to provide to the patient. CSW will con't to follow if he has questions prior to dc.  Daniel Frye, [place]

## 2016-11-04 ENCOUNTER — Other Ambulatory Visit: Payer: Self-pay | Admitting: Internal Medicine

## 2016-11-04 DIAGNOSIS — J449 Chronic obstructive pulmonary disease, unspecified: Secondary | ICD-10-CM

## 2016-11-04 NOTE — Telephone Encounter (Signed)
Could not lvm with patient to schedule office visit.

## 2016-12-15 ENCOUNTER — Other Ambulatory Visit: Payer: Self-pay | Admitting: Internal Medicine

## 2016-12-15 DIAGNOSIS — J449 Chronic obstructive pulmonary disease, unspecified: Secondary | ICD-10-CM

## 2016-12-16 NOTE — Telephone Encounter (Signed)
Could not reach patient at this time for follow up.

## 2017-01-30 ENCOUNTER — Ambulatory Visit (INDEPENDENT_AMBULATORY_CARE_PROVIDER_SITE_OTHER): Payer: Medicare HMO

## 2017-01-30 ENCOUNTER — Ambulatory Visit
Admission: EM | Admit: 2017-01-30 | Discharge: 2017-01-30 | Disposition: A | Discharge status: Home / Self Care | Payer: Medicare HMO | Attending: Family Medicine | Admitting: Family Medicine

## 2017-01-30 ENCOUNTER — Encounter: Payer: Self-pay | Admitting: *Deleted

## 2017-01-30 ENCOUNTER — Ambulatory Visit (INDEPENDENT_AMBULATORY_CARE_PROVIDER_SITE_OTHER): Payer: Medicare HMO | Admitting: Family Medicine

## 2017-01-30 ENCOUNTER — Encounter: Payer: Self-pay | Admitting: Family Medicine

## 2017-01-30 VITALS — BP 138/84 | HR 80 | Resp 16 | Ht 70.0 in | Wt 180.0 lb

## 2017-01-30 DIAGNOSIS — M79652 Pain in left thigh: Secondary | ICD-10-CM | POA: Diagnosis not present

## 2017-01-30 DIAGNOSIS — I10 Essential (primary) hypertension: Secondary | ICD-10-CM

## 2017-01-30 DIAGNOSIS — I5032 Chronic diastolic (congestive) heart failure: Secondary | ICD-10-CM

## 2017-01-30 DIAGNOSIS — M79605 Pain in left leg: Secondary | ICD-10-CM

## 2017-01-30 DIAGNOSIS — M79659 Pain in unspecified thigh: Secondary | ICD-10-CM

## 2017-01-30 DIAGNOSIS — E119 Type 2 diabetes mellitus without complications: Secondary | ICD-10-CM

## 2017-01-30 DIAGNOSIS — M1612 Unilateral primary osteoarthritis, left hip: Secondary | ICD-10-CM | POA: Diagnosis not present

## 2017-01-30 DIAGNOSIS — F1994 Other psychoactive substance use, unspecified with psychoactive substance-induced mood disorder: Secondary | ICD-10-CM

## 2017-01-30 DIAGNOSIS — R69 Illness, unspecified: Secondary | ICD-10-CM | POA: Diagnosis not present

## 2017-01-30 MED ORDER — KETOROLAC TROMETHAMINE 10 MG PO TABS: 10.0000 mg | ORAL_TABLET | Freq: Three times a day (TID) | ORAL | 0 refills | Status: DC | PRN

## 2017-01-30 MED ORDER — KETOROLAC TROMETHAMINE 30 MG/ML IJ SOLN
30.0000 mg | Freq: Once | INTRAMUSCULAR | Status: AC
  Administered 2017-01-30: 30 mg via INTRAMUSCULAR

## 2017-01-30 NOTE — ED Triage Notes (Signed)
Pt awoke with left thigh pain 3 weeks ago. Pain has gradually worsened. Denies injury.

## 2017-01-30 NOTE — Progress Notes (Signed)
Date:  01/30/2017   Name:  Daniel Frye   DOB:  30-Sep-1950   MRN:  [id number]  PCP:  [name], MD    Chief Complaint: Leg Pain (severe pain in left leg x 3 weeks with no injury )   History of Present Illness:  This is a 65 y.o. male not seen by me since 04/29/15 or in office since 01/22/16. Reports 10/10 pain L thigh without known injury, woke with pain 3 weeks ago, not improving, ibuprofen no help. Poor historian, hx multisubstance abuse, admitted [facility] 04/15/16 for alcohol withdrawal. T2DM on metformin. HTN on lisinopril, CHF on Coreg/Lasix, depression on Celexa but unclear if taking meds as prescribed.  Review of Systems:  Review of Systems  Constitutional: Negative for chills and fever.  Respiratory: Negative for cough and shortness of breath.   Cardiovascular: Negative for chest pain and leg swelling.  Gastrointestinal: Negative for abdominal pain.  Endocrine: Negative for polydipsia and polyuria.  Genitourinary: Negative for difficulty urinating.  Musculoskeletal: Negative for back pain.  Neurological: Negative for syncope and light-headedness.    Patient Active Problem List   Diagnosis Date Noted  . Hypokalemia 03/03/2016  . Hypomagnesemia 03/03/2016  . COPD exacerbation ([place]) 03/03/2016  . Acute bronchitis 03/03/2016  . Substance induced mood disorder ([place]) 03/02/2016  . Moderate major depression, single episode ([place]) 03/02/2016  . Alcohol withdrawal ([place]) 02/29/2016  . Crack cocaine use 01/22/2016  . Obstructive sleep apnea of adult 03/30/2015  . SOB (shortness of breath) on exertion 03/30/2015  . Edema of foot 03/30/2015  . Chronic diastolic CHF (congestive heart failure), NYHA class 2 ([place]) 03/30/2015  . Alcohol abuse 03/23/2015  . Noncompliance 03/23/2015  . Vitamin D deficiency 01/27/2015  . Diabetes mellitus type 2, controlled, without complications ([place]) 64/40/3474  . Obesity (BMI 30.0-34.9) 11/26/2014  . COPD (chronic obstructive pulmonary disease) ([place])  06/22/2014  . Essential (primary) hypertension 06/22/2014  . Rheumatoid arthritis ([place]) 06/22/2014  . Tobacco abuse 06/22/2014  . Acute respiratory failure with hypercapnia ([place]) 06/22/2014  . Chronic obstructive pulmonary disease with acute exacerbation ([place]) 06/22/2014    Prior to Admission medications   Medication Sig Start Date End Date Taking? Authorizing Provider  carvedilol (COREG) 6.25 MG tablet Take 1 tablet (6.25 mg total) by mouth 2 (two) times daily with a meal. 01/22/16  Yes Glean Hess, MD  furosemide (LASIX) 40 MG tablet Take 1 tablet (40 mg total) by mouth daily. Patient taking differently: Take 20 mg by mouth daily.  01/22/16  Yes Glean Hess, MD  metFORMIN (GLUCOPHAGE) 500 MG tablet Take 1 tablet (500 mg total) by mouth 2 (two) times daily with a meal. 01/27/15  Yes Khalis Hittle, Gwyndolyn Saxon, MD  Multiple Vitamin (MULTIVITAMIN WITH MINERALS) TABS tablet Take 1 tablet by mouth daily. 03/04/16  Yes Theodoro Grist, MD  citalopram (CELEXA) 20 MG tablet Take 20 mg by mouth daily.    [provider]  folic acid (FOLVITE) 1 MG tablet Take 1 tablet (1 mg total) by mouth daily. Patient not taking: Reported on 01/30/2017 03/04/16   Theodoro Grist, MD  lisinopril (PRINIVIL,ZESTRIL) 40 MG tablet Take 1 tablet (40 mg total) by mouth daily at 2 PM. Patient not taking: Reported on 01/30/2017 03/03/16   Theodoro Grist, MD    No Known Allergies  Past Surgical History:  Procedure Laterality Date  . heart surgery to drain fluid N/A 1987   Heart surgery to remove fluid     Social History   Tobacco Use  .  Smoking status: Former Smoker    Packs/day: 0.33    Years: 50.00    Pack years: 16.50    Types: Cigarettes    Last attempt to quit: 05/06/2014    Years since quitting: 2.7  . Smokeless tobacco: Never Used  Substance Use Topics  . Alcohol use: Yes    Alcohol/week: 9.0 oz    Types: 15 Glasses of wine per week    Comment: Drinks 1/5 day of wine   . Drug use: Yes     Types: "Crack" cocaine    Comment: Uses only once in awhile and does not look for it but has hard time when it is there tunrning it down.     Family History  Problem Relation Age of Onset  . COPD Brother     Medication list has been reviewed and updated.  Physical Examination: BP 138/84   Pulse 80   Resp 16   Ht 5\' 10"  (1.778 m)   Wt 180 lb (81.6 kg)   SpO2 96%   BMI 25.83 kg/m   Physical Exam  Constitutional: He appears well-developed and well-nourished.  Cardiovascular: Normal rate, regular rhythm and normal heart sounds.  Pulmonary/Chest: Effort normal and breath sounds normal.  Musculoskeletal: He exhibits no edema.  No clear abnormality of L thigh  Neurological: He is alert.  Skin: Skin is warm and dry.  Psychiatric:  Crying due to pain Appears somewhat confused  Nursing note and vitals reviewed.   Assessment and Plan:  1. Left leg pain, severe Unclear etiology, unable to treat in office, to Urgent Care for further evaluation/treatment  2. Controlled type 2 diabetes mellitus without complication, without long-term current use of insulin (HCC) Unclear control on metformin, needs a1c/MCR  3. Chronic diastolic CHF (congestive heart failure), NYHA class 2 ([place]) Appears compensated, cont Coreg/Lasix, saw cards 10/13/15  4. Substance induced mood disorder (HCC) On Celexa, complicated by ongoing multisubstance abuse  5. Essential (primary) hypertension Adequate control on current regimen  Return if symptoms worsen or fail to improve.  Satira Anis. Deannah Rossi, [facility]  01/30/2017

## 2017-01-30 NOTE — Discharge Instructions (Signed)
Medication as needed.  Follow up with your primary.  Take care  Dr. Lacinda Axon

## 2017-01-30 NOTE — ED Provider Notes (Signed)
[facility]    CSN: [id number] Arrival date & time: 01/30/17  1423  History   Chief Complaint Chief Complaint  Patient presents with  . Leg Pain   HPI  66 year old male with an extensive past medical history including congestive heart failure, COPD, substance abuse, hypertension, RA, diabetes presents with leg pain.  Patient reports that he has had a 3-week history of left thigh pain.  Severe.  10/10 pain.  Worse with activity.  No relieving factors.  He states that it is the worst pain he has ever experienced in his life.  He does not recall any fall, trauma, injury.  He is a poor historian however.  Has a long history of substance abuse.  Has recently been admitted earlier this year for alcohol withdrawal.  He has been falling frequently since then.  He has taken Motrin without improvement.  Of note, he saw his primary care physician for this earlier today.  He was sent down for further evaluation given his severe pain.  Past Medical History:  Diagnosis Date  . CHF (congestive heart failure) ([place])   . COPD (chronic obstructive pulmonary disease) ([place])   . Depression   . Diabetes mellitus without complication ([place])   . Hypertension   . Rheumatoid arteritis 1989   Took shots for awhile but not now  . Substance abuse [facility]     Patient Active Problem List   Diagnosis Date Noted  . Hypokalemia 03/03/2016  . Hypomagnesemia 03/03/2016  . Substance induced mood disorder ([place]) 03/02/2016  . Moderate major depression, single episode ([place]) 03/02/2016  . Alcohol withdrawal ([place]) 02/29/2016  . Crack cocaine use 01/22/2016  . Obstructive sleep apnea of adult 03/30/2015  . SOB (shortness of breath) on exertion 03/30/2015  . Edema of foot 03/30/2015  . Chronic diastolic CHF (congestive heart failure), NYHA class 2 ([place]) 03/30/2015  . Alcohol abuse 03/23/2015  . Noncompliance 03/23/2015  . Vitamin D deficiency 01/27/2015  . Diabetes mellitus type 2, controlled, without  complications ([place]) 82/50/5397  . Obesity (BMI 30.0-34.9) 11/26/2014  . COPD (chronic obstructive pulmonary disease) ([place]) 06/22/2014  . Essential (primary) hypertension 06/22/2014  . Rheumatoid arthritis ([place]) 06/22/2014  . Tobacco abuse 06/22/2014  . Acute respiratory failure with hypercapnia ([place]) 06/22/2014  . Chronic obstructive pulmonary disease with acute exacerbation ([place]) 06/22/2014    Past Surgical History:  Procedure Laterality Date  . heart surgery to drain fluid N/A 1987   Heart surgery to remove fluid     Home Medications    Prior to Admission medications   Medication Sig Start Date End Date Taking? Authorizing Provider  carvedilol (COREG) 6.25 MG tablet Take 1 tablet (6.25 mg total) by mouth 2 (two) times daily with a meal. 01/22/16   Glean Hess, MD  citalopram (CELEXA) 20 MG tablet Take 20 mg by mouth daily.    [provider]  folic acid (FOLVITE) 1 MG tablet Take 1 tablet (1 mg total) by mouth daily. Patient not taking: Reported on 01/30/2017 03/04/16   Theodoro Grist, MD  furosemide (LASIX) 40 MG tablet Take 1 tablet (40 mg total) by mouth daily. Patient taking differently: Take 20 mg by mouth daily.  01/22/16   Glean Hess, MD  ketorolac (TORADOL) 10 MG tablet Take 1 tablet (10 mg total) by mouth every 8 (eight) hours as needed. 01/30/17   Coral Spikes, DO  lisinopril (PRINIVIL,ZESTRIL) 40 MG tablet Take 1 tablet (40 mg total) by mouth daily at 2 PM.  Patient not taking: Reported on 01/30/2017 03/03/16   Theodoro Grist, MD  metFORMIN (GLUCOPHAGE) 500 MG tablet Take 1 tablet (500 mg total) by mouth 2 (two) times daily with a meal. 01/27/15   [place], Gwyndolyn Saxon, MD  Multiple Vitamin (MULTIVITAMIN WITH MINERALS) TABS tablet Take 1 tablet by mouth daily. 03/04/16   Theodoro Grist, MD    Family History Family History  Problem Relation Age of Onset  . COPD Brother     Social History Social History   Tobacco Use  . Smoking status: Former  Smoker    Packs/day: 0.33    Years: 50.00    Pack years: 16.50    Types: Cigarettes    Last attempt to quit: 05/06/2014    Years since quitting: 2.7  . Smokeless tobacco: Never Used  Substance Use Topics  . Alcohol use: Yes    Alcohol/week: 9.0 oz    Types: 15 Glasses of wine per week    Comment: Drinks 1/5 day of wine   . Drug use: Yes    Types: "Crack" cocaine    Comment: Uses only once in awhile and does not look for it but has hard time when it is there tunrning it down.      Allergies   Patient has no known allergies.   Review of Systems Review of Systems  Constitutional: Negative.   Musculoskeletal:       Severe, left thigh pain. No back pain.  All other systems reviewed and are negative.  Physical Exam Triage Vital Signs ED Triage Vitals  Enc Vitals Group     BP 01/30/17 1436 (!) 152/117     Pulse Rate 01/30/17 1436 88     Resp 01/30/17 1436 16     Temp 01/30/17 1436 98.3 F (36.8 C)     Temp Source 01/30/17 1436 Oral     SpO2 01/30/17 1436 98 %     Weight 01/30/17 1438 180 lb (81.6 kg)     Height 01/30/17 1438 5\' 10"  (1.778 m)     Head Circumference --      Peak Flow --      Pain Score 01/30/17 1439 10     Pain Loc --      Pain Edu? --      Excl. in [place]? --    Updated Vital Signs BP (!) 152/117 (BP Location: Left Arm)   Pulse 88   Temp 98.3 F (36.8 C) (Oral)   Resp 16   Ht 5\' 10"  (1.778 m)   Wt 180 lb (81.6 kg)   SpO2 98%   BMI 25.83 kg/m   Physical Exam  Constitutional: He appears well-developed and well-nourished.  Patient endorsing severe pain but does not appear to be in any distress.  HENT:  Head: Normocephalic and atraumatic.  Nose: Nose normal.  Eyes: Conjunctivae are normal. No scleral icterus.  Cardiovascular: Normal rate and regular rhythm.  Pulmonary/Chest: Effort normal and breath sounds normal. He has no wheezes. He has no rales.  Abdominal: Soft. He exhibits no distension.  Musculoskeletal:  Left thigh -patient endorsing  left anterior thigh pain.  No visible abnormality.  Antalgic gait.  No tenderness to palpation.  No bruising noted.  Neurological: He is alert.  No apparent focal deficits.  Skin: Skin is warm. No rash noted.  Psychiatric: He has a normal mood and affect. His behavior is normal.  Vitals reviewed.  UC Treatments / Results  Labs (all labs ordered are listed, but only abnormal results  are displayed) Labs Reviewed - No data to display  EKG  EKG Interpretation None       Radiology Dg Femur Min 2 Views Left  Result Date: 01/30/2017 CLINICAL DATA:  LEFT upper leg pain for 2-3 weeks, no injury, pain upper anterolateral thigh area with tenderness to touch, history hypertension, diabetes mellitus, rheumatoid arthritis, CHF, COPD EXAM: LEFT FEMUR 2 VIEWS COMPARISON:  None FINDINGS: Osseous mineralization normal. Advanced osteoarthritic changes LEFT knee with joint space narrowing and spur formation. Mild joint space narrowing at LEFT hip. No acute fracture, dislocation, or bone destruction. Minimal atherosclerotic calcification. Soft tissues radiographically unremarkable. IMPRESSION: Degenerative changes of LEFT knee and to a lesser degree LEFT hip. No acute abnormalities. Electronically Signed   By: Lavonia Dana M.D.   On: 01/30/2017 16:04    Procedures Procedures (including critical care time)  Medications Ordered in UC Medications  ketorolac (TORADOL) 30 MG/ML injection 30 mg (30 mg Intramuscular Given 01/30/17 1537)   Initial Impression / Assessment and Plan / UC Course  I have reviewed the triage vital signs and the nursing notes.  Pertinent labs & imaging results that were available during my care of the patient were reviewed by me and considered in my medical decision making (see chart for details).     66 year old male presents with anterior thigh pain.  X-ray unrevealing.  Pain improved with Toradol.  Sending home with a just a few doses of Toradol.  Needs close  follow-up.  Final Clinical Impressions(s) / UC Diagnoses   Final diagnoses:  Thigh pain  Pain of left thigh   ED Discharge Orders        Ordered    ketorolac (TORADOL) 10 MG tablet  Every 8 hours PRN     01/30/17 1620     Controlled Substance Prescriptions [place] Controlled Substance Registry consulted? Not Applicable   Coral Spikes, DO 01/30/17 1632

## 2017-06-30 ENCOUNTER — Ambulatory Visit (INDEPENDENT_AMBULATORY_CARE_PROVIDER_SITE_OTHER): Payer: Medicare HMO | Admitting: Family Medicine

## 2017-06-30 ENCOUNTER — Other Ambulatory Visit: Payer: Self-pay | Admitting: Family Medicine

## 2017-06-30 ENCOUNTER — Encounter: Payer: Self-pay | Admitting: Family Medicine

## 2017-06-30 VITALS — BP 144/84 | HR 88 | Ht 70.0 in | Wt 181.0 lb

## 2017-06-30 DIAGNOSIS — M069 Rheumatoid arthritis, unspecified: Secondary | ICD-10-CM

## 2017-06-30 DIAGNOSIS — I10 Essential (primary) hypertension: Secondary | ICD-10-CM

## 2017-06-30 DIAGNOSIS — I5032 Chronic diastolic (congestive) heart failure: Secondary | ICD-10-CM

## 2017-06-30 DIAGNOSIS — R52 Pain, unspecified: Secondary | ICD-10-CM | POA: Diagnosis not present

## 2017-06-30 DIAGNOSIS — R69 Illness, unspecified: Secondary | ICD-10-CM | POA: Diagnosis not present

## 2017-06-30 DIAGNOSIS — F1994 Other psychoactive substance use, unspecified with psychoactive substance-induced mood disorder: Secondary | ICD-10-CM | POA: Diagnosis not present

## 2017-06-30 MED ORDER — FUROSEMIDE 40 MG PO TABS: 40.0000 mg | ORAL_TABLET | Freq: Every day | ORAL | 0 refills | Status: DC

## 2017-06-30 MED ORDER — KETOROLAC TROMETHAMINE 10 MG PO TABS: 10.0000 mg | ORAL_TABLET | Freq: Three times a day (TID) | ORAL | 0 refills | Status: DC | PRN

## 2017-06-30 MED ORDER — KETOROLAC TROMETHAMINE 30 MG/ML IJ SOLN
30.0000 mg | Freq: Once | INTRAMUSCULAR | Status: AC
  Administered 2017-06-30: 30 mg via INTRAMUSCULAR

## 2017-06-30 MED ORDER — LISINOPRIL 40 MG PO TABS: 40.0000 mg | ORAL_TABLET | Freq: Every day | ORAL | 5 refills | Status: DC

## 2017-06-30 MED ORDER — METFORMIN HCL 500 MG PO TABS: 500.0000 mg | ORAL_TABLET | Freq: Two times a day (BID) | ORAL | 2 refills | Status: DC

## 2017-06-30 MED ORDER — CARVEDILOL 6.25 MG PO TABS: 6.2500 mg | ORAL_TABLET | Freq: Two times a day (BID) | ORAL | 0 refills | Status: DC

## 2017-06-30 MED ORDER — KETOROLAC TROMETHAMINE 30 MG/ML IJ SOLN: 30.0000 mg | Freq: Once | INTRAMUSCULAR | Status: DC

## 2017-06-30 NOTE — Progress Notes (Signed)
Name: Daniel Frye   MRN: [id number]    DOB: 05-24-1950   Date:06/30/2017       Progress Note  Subjective  Chief Complaint  Chief Complaint  Patient presents with  . edema extremities    "from head to toes"- been off all meds for 6 months to a year    I need something for pain. "I hurt all over".  Pain has been for 3 weeks all over my body.  Patient continue to drink and smoke.  Patient denies any fever, dyspnea, chills, nor loss of consciousness.  Patient denies any depression.  Hypertension  This is a chronic problem. The current episode started more than 1 year ago. The problem is uncontrolled. Associated symptoms include neck pain. Pertinent negatives include no anxiety, blurred vision, chest pain, headaches, malaise/fatigue, orthopnea, palpitations, peripheral edema, PND, shortness of breath or sweats. There are no associated agents to hypertension. Risk factors for coronary artery disease include dyslipidemia. Past treatments include beta blockers, ACE inhibitors and diuretics. The current treatment provides moderate improvement. There are no compliance problems.  There is no history of angina, kidney disease, CAD/MI, CVA, heart failure, left ventricular hypertrophy, PVD or retinopathy. There is no history of chronic renal disease, a hypertension causing med or renovascular disease.  Shortness of Breath  This is a chronic problem. The current episode started more than 1 year ago. The problem has been waxing and waning. Associated symptoms include neck pain. Pertinent negatives include no abdominal pain, chest pain, claudication, coryza, ear pain, fever, headaches, hemoptysis, leg pain, leg swelling, orthopnea, PND, rash, rhinorrhea, sore throat, sputum production, swollen glands, syncope, vomiting or wheezing. He has tried beta agonist inhalers for the symptoms. His past medical history is significant for COPD. There is no history of a heart failure.  Congestive Heart Failure  Presents for  follow-up visit. Pertinent negatives include no abdominal pain, chest pain, chest pressure, claudication, edema, fatigue, muscle weakness, near-syncope, nocturia, orthopnea, palpitations, paroxysmal nocturnal dyspnea, shortness of breath or unexpected weight change. The symptoms have been worsening (stopped all medication).    No problem-specific Assessment & Plan notes found for this encounter.   Past Medical History:  Diagnosis Date  . CHF (congestive heart failure) ()   . COPD (chronic obstructive pulmonary disease) ([place])   . Depression   . Diabetes mellitus without complication ([place])   . Hypertension   . Rheumatoid arteritis 1989   Took shots for awhile but not now  . Substance abuse [facility])     Past Surgical History:  Procedure Laterality Date  . heart surgery to drain fluid N/A 1987   Heart surgery to remove fluid     Family History  Problem Relation Age of Onset  . COPD Brother     Social History   Socioeconomic History  . Marital status: Single    Spouse name: Not on file  . Number of children: Not on file  . Years of education: Not on file  . Highest education level: Not on file  Occupational History  . Not on file  Social Needs  . Financial resource strain: Not on file  . Food insecurity:    Worry: Not on file    Inability: Not on file  . Transportation needs:    Medical: Not on file    Non-medical: Not on file  Tobacco Use  . Smoking status: Former Smoker    Packs/day: 0.33    Years: 50.00    Pack years: 16.50  Types: Cigarettes    Last attempt to quit: 05/06/2014    Years since quitting: 3.1  . Smokeless tobacco: Never Used  Substance and Sexual Activity  . Alcohol use: Yes    Alcohol/week: 9.0 oz    Types: 15 Glasses of wine per week    Comment: Drinks 1/5 day of wine   . Drug use: Yes    Types: "Crack" cocaine    Comment: Uses only once in awhile and does not look for it but has hard time when it is there tunrning it down.   Marland Kitchen Sexual  activity: Not on file  Lifestyle  . Physical activity:    Days per week: Not on file    Minutes per session: Not on file  . Stress: Not on file  Relationships  . Social connections:    Talks on phone: Not on file    Gets together: Not on file    Attends religious service: Not on file    Active member of club or organization: Not on file    Attends meetings of clubs or organizations: Not on file    Relationship status: Not on file  . Intimate partner violence:    Fear of current or ex partner: Not on file    Emotionally abused: Not on file    Physically abused: Not on file    Forced sexual activity: Not on file  Other Topics Concern  . Not on file  Social History Narrative  . Not on file    No Known Allergies  Outpatient Medications Prior to Visit  Medication Sig Dispense Refill  . citalopram (CELEXA) 20 MG tablet Take 20 mg by mouth daily.    . folic acid (FOLVITE) 1 MG tablet Take 1 tablet (1 mg total) by mouth daily. (Patient not taking: Reported on 01/30/2017) 30 tablet 5  . Multiple Vitamin (MULTIVITAMIN WITH MINERALS) TABS tablet Take 1 tablet by mouth daily. (Patient not taking: Reported on 06/30/2017) 30 tablet 5  . carvedilol (COREG) 6.25 MG tablet Take 1 tablet (6.25 mg total) by mouth 2 (two) times daily with a meal. (Patient not taking: Reported on 06/30/2017) 60 tablet 0  . furosemide (LASIX) 40 MG tablet Take 1 tablet (40 mg total) by mouth daily. (Patient not taking: Reported on 06/30/2017) 30 tablet 0  . ketorolac (TORADOL) 10 MG tablet Take 1 tablet (10 mg total) by mouth every 8 (eight) hours as needed. (Patient not taking: Reported on 06/30/2017) 6 tablet 0  . lisinopril (PRINIVIL,ZESTRIL) 40 MG tablet Take 1 tablet (40 mg total) by mouth daily at 2 PM. (Patient not taking: Reported on 01/30/2017) 30 tablet 5  . metFORMIN (GLUCOPHAGE) 500 MG tablet Take 1 tablet (500 mg total) by mouth 2 (two) times daily with a meal. (Patient not taking: Reported on 06/30/2017) 60  tablet 2   No facility-administered medications prior to visit.     Review of Systems  Constitutional: Negative for chills, fatigue, fever, malaise/fatigue, unexpected weight change and weight loss.  HENT: Negative for ear discharge, ear pain, rhinorrhea and sore throat.   Eyes: Negative for blurred vision.  Respiratory: Negative for cough, hemoptysis, sputum production, shortness of breath and wheezing.   Cardiovascular: Negative for chest pain, palpitations, orthopnea, claudication, leg swelling, syncope, PND and near-syncope.  Gastrointestinal: Negative for abdominal pain, blood in stool, constipation, diarrhea, heartburn, melena, nausea and vomiting.  Genitourinary: Negative for dysuria, frequency, hematuria, nocturia and urgency.  Musculoskeletal: Positive for neck pain. Negative for back pain,  joint pain, myalgias and muscle weakness.  Skin: Negative for rash.  Neurological: Negative for dizziness, tingling, sensory change, focal weakness and headaches.  Endo/Heme/Allergies: Negative for environmental allergies and polydipsia. Does not bruise/bleed easily.  Psychiatric/Behavioral: Negative for depression and suicidal ideas. The patient is not nervous/anxious and does not have insomnia.      Objective  Vitals:   06/30/17 1100  BP: (!) 144/84  Pulse: 88  Weight: 181 lb (82.1 kg)  Height: 5\' 10"  (1.778 m)    Physical Exam  Constitutional: He is oriented to person, place, and time.  HENT:  Head: Normocephalic.  Right Ear: External ear normal.  Left Ear: External ear normal.  Nose: Nose normal.  Mouth/Throat: Oropharynx is clear and moist.  Eyes: Pupils are equal, round, and reactive to light. Conjunctivae and EOM are normal. Right eye exhibits no discharge. Left eye exhibits no discharge. No scleral icterus.  Neck: Normal range of motion. Neck supple. No JVD present. No tracheal deviation present. No thyromegaly present.  Cardiovascular: Normal rate, regular rhythm, normal  heart sounds and intact distal pulses. Exam reveals no gallop and no friction rub.  No murmur heard. Pulmonary/Chest: Breath sounds normal. No respiratory distress. He has no wheezes. He has no rales.  Abdominal: Soft. Bowel sounds are normal. He exhibits no mass. There is no hepatosplenomegaly. There is no tenderness. There is no rebound, no guarding and no CVA tenderness.  Musculoskeletal: Normal range of motion. He exhibits no edema or tenderness.  Lymphadenopathy:    He has no cervical adenopathy.  Neurological: He is alert and oriented to person, place, and time. He has normal strength and normal reflexes. No cranial nerve deficit.  Skin: Skin is warm. No rash noted.  Nursing note and vitals reviewed.     Assessment & Plan  Problem List Items Addressed This Visit      Cardiovascular and Mediastinum   Essential (primary) hypertension   Relevant Medications   furosemide (LASIX) 40 MG tablet   carvedilol (COREG) 6.25 MG tablet   lisinopril (PRINIVIL,ZESTRIL) 40 MG tablet     Musculoskeletal and Integument   Rheumatoid arthritis (HCC)   Relevant Medications   ketorolac (TORADOL) 10 MG tablet   ketorolac (TORADOL) 30 MG/ML injection 30 mg (Completed)     Other   Substance induced mood disorder ([place])    Other Visit Diagnoses    Generalized pain    -  Primary   to er if pain continues or worsens   Relevant Medications   ketorolac (TORADOL) 10 MG tablet   ketorolac (TORADOL) 30 MG/ML injection 30 mg (Completed)   Chronic diastolic heart failure (HCC)       Relevant Medications   furosemide (LASIX) 40 MG tablet   carvedilol (COREG) 6.25 MG tablet   lisinopril (PRINIVIL,ZESTRIL) 40 MG tablet      Meds ordered this encounter  Medications  . furosemide (LASIX) 40 MG tablet    Sig: Take 1 tablet (40 mg total) by mouth daily.    Dispense:  30 tablet    Refill:  0  . carvedilol (COREG) 6.25 MG tablet    Sig: Take 1 tablet (6.25 mg total) by mouth 2 (two) times daily with  a meal.    Dispense:  60 tablet    Refill:  0  . lisinopril (PRINIVIL,ZESTRIL) 40 MG tablet    Sig: Take 1 tablet (40 mg total) by mouth daily at 2 PM.    Dispense:  30 tablet    Refill:  5  . ketorolac (TORADOL) 10 MG tablet    Sig: Take 1 tablet (10 mg total) by mouth every 8 (eight) hours as needed.    Dispense:  6 tablet    Refill:  0  . DISCONTD: ketorolac (TORADOL) 30 MG/ML injection 30 mg  . DISCONTD: metFORMIN (GLUCOPHAGE) 500 MG tablet    Sig: Take 1 tablet (500 mg total) by mouth 2 (two) times daily with a meal.    Dispense:  60 tablet    Refill:  2  . ketorolac (TORADOL) 30 MG/ML injection 30 mg  refer to [facility] for further eval    Dr. Wilmarie Sparlin [facility]  06/30/17

## 2017-07-01 DIAGNOSIS — M069 Rheumatoid arthritis, unspecified: Secondary | ICD-10-CM | POA: Diagnosis not present

## 2017-07-01 DIAGNOSIS — I509 Heart failure, unspecified: Secondary | ICD-10-CM | POA: Diagnosis not present

## 2017-07-01 DIAGNOSIS — M0579 Rheumatoid arthritis with rheumatoid factor of multiple sites without organ or systems involvement: Secondary | ICD-10-CM | POA: Diagnosis not present

## 2017-07-01 DIAGNOSIS — M7989 Other specified soft tissue disorders: Secondary | ICD-10-CM | POA: Diagnosis not present

## 2017-07-01 DIAGNOSIS — Z87891 Personal history of nicotine dependence: Secondary | ICD-10-CM | POA: Diagnosis not present

## 2017-07-01 DIAGNOSIS — I110 Hypertensive heart disease with heart failure: Secondary | ICD-10-CM | POA: Diagnosis not present

## 2017-07-01 DIAGNOSIS — R2689 Other abnormalities of gait and mobility: Secondary | ICD-10-CM | POA: Diagnosis not present

## 2017-07-01 DIAGNOSIS — M0600 Rheumatoid arthritis without rheumatoid factor, unspecified site: Secondary | ICD-10-CM | POA: Diagnosis not present

## 2017-07-01 DIAGNOSIS — J449 Chronic obstructive pulmonary disease, unspecified: Secondary | ICD-10-CM | POA: Diagnosis not present

## 2017-07-06 ENCOUNTER — Other Ambulatory Visit: Payer: Self-pay

## 2017-07-06 DIAGNOSIS — Z79899 Other long term (current) drug therapy: Secondary | ICD-10-CM | POA: Diagnosis not present

## 2017-07-06 DIAGNOSIS — M2550 Pain in unspecified joint: Secondary | ICD-10-CM

## 2017-07-06 DIAGNOSIS — M25562 Pain in left knee: Secondary | ICD-10-CM | POA: Diagnosis not present

## 2017-07-06 DIAGNOSIS — R52 Pain, unspecified: Secondary | ICD-10-CM | POA: Diagnosis not present

## 2017-07-06 DIAGNOSIS — J449 Chronic obstructive pulmonary disease, unspecified: Secondary | ICD-10-CM | POA: Diagnosis not present

## 2017-07-06 DIAGNOSIS — M25561 Pain in right knee: Secondary | ICD-10-CM | POA: Diagnosis not present

## 2017-07-06 DIAGNOSIS — Z87891 Personal history of nicotine dependence: Secondary | ICD-10-CM | POA: Diagnosis not present

## 2017-07-06 DIAGNOSIS — I10 Essential (primary) hypertension: Secondary | ICD-10-CM | POA: Diagnosis not present

## 2017-07-06 DIAGNOSIS — M069 Rheumatoid arthritis, unspecified: Secondary | ICD-10-CM | POA: Diagnosis not present

## 2017-07-06 DIAGNOSIS — M79641 Pain in right hand: Secondary | ICD-10-CM | POA: Diagnosis not present

## 2017-07-24 DIAGNOSIS — J449 Chronic obstructive pulmonary disease, unspecified: Secondary | ICD-10-CM | POA: Diagnosis not present

## 2017-07-24 DIAGNOSIS — G8929 Other chronic pain: Secondary | ICD-10-CM | POA: Diagnosis not present

## 2017-07-24 DIAGNOSIS — R52 Pain, unspecified: Secondary | ICD-10-CM | POA: Diagnosis not present

## 2017-07-24 DIAGNOSIS — R69 Illness, unspecified: Secondary | ICD-10-CM | POA: Diagnosis not present

## 2017-07-24 DIAGNOSIS — M069 Rheumatoid arthritis, unspecified: Secondary | ICD-10-CM | POA: Diagnosis not present

## 2017-07-24 DIAGNOSIS — Z87891 Personal history of nicotine dependence: Secondary | ICD-10-CM | POA: Diagnosis not present

## 2017-07-24 DIAGNOSIS — E119 Type 2 diabetes mellitus without complications: Secondary | ICD-10-CM | POA: Diagnosis not present

## 2017-07-24 DIAGNOSIS — R4781 Slurred speech: Secondary | ICD-10-CM | POA: Diagnosis not present

## 2017-07-24 DIAGNOSIS — I10 Essential (primary) hypertension: Secondary | ICD-10-CM | POA: Diagnosis not present

## 2017-07-24 DIAGNOSIS — R4182 Altered mental status, unspecified: Secondary | ICD-10-CM | POA: Diagnosis not present

## 2017-08-09 DIAGNOSIS — Z79899 Other long term (current) drug therapy: Secondary | ICD-10-CM | POA: Insufficient documentation

## 2017-08-09 DIAGNOSIS — M170 Bilateral primary osteoarthritis of knee: Secondary | ICD-10-CM | POA: Diagnosis not present

## 2017-08-09 DIAGNOSIS — I10 Essential (primary) hypertension: Secondary | ICD-10-CM | POA: Diagnosis not present

## 2017-08-09 DIAGNOSIS — M12872 Other specific arthropathies, not elsewhere classified, left ankle and foot: Secondary | ICD-10-CM | POA: Diagnosis not present

## 2017-08-09 DIAGNOSIS — M12841 Other specific arthropathies, not elsewhere classified, right hand: Secondary | ICD-10-CM | POA: Diagnosis not present

## 2017-08-09 DIAGNOSIS — M069 Rheumatoid arthritis, unspecified: Secondary | ICD-10-CM | POA: Diagnosis not present

## 2017-08-09 DIAGNOSIS — M12871 Other specific arthropathies, not elsewhere classified, right ankle and foot: Secondary | ICD-10-CM | POA: Diagnosis not present

## 2017-08-09 DIAGNOSIS — I5032 Chronic diastolic (congestive) heart failure: Secondary | ICD-10-CM | POA: Diagnosis not present

## 2017-08-09 DIAGNOSIS — M79641 Pain in right hand: Secondary | ICD-10-CM | POA: Diagnosis not present

## 2017-08-19 DIAGNOSIS — M069 Rheumatoid arthritis, unspecified: Secondary | ICD-10-CM | POA: Diagnosis not present

## 2017-08-19 DIAGNOSIS — J449 Chronic obstructive pulmonary disease, unspecified: Secondary | ICD-10-CM | POA: Diagnosis not present

## 2017-08-19 DIAGNOSIS — I951 Orthostatic hypotension: Secondary | ICD-10-CM | POA: Diagnosis not present

## 2017-08-19 DIAGNOSIS — I10 Essential (primary) hypertension: Secondary | ICD-10-CM | POA: Diagnosis not present

## 2017-08-19 DIAGNOSIS — I739 Peripheral vascular disease, unspecified: Secondary | ICD-10-CM | POA: Diagnosis not present

## 2017-08-19 DIAGNOSIS — I499 Cardiac arrhythmia, unspecified: Secondary | ICD-10-CM | POA: Diagnosis not present

## 2017-08-19 DIAGNOSIS — R69 Illness, unspecified: Secondary | ICD-10-CM | POA: Diagnosis not present

## 2017-08-19 DIAGNOSIS — G3184 Mild cognitive impairment, so stated: Secondary | ICD-10-CM | POA: Diagnosis not present

## 2017-08-19 DIAGNOSIS — G8929 Other chronic pain: Secondary | ICD-10-CM | POA: Diagnosis not present

## 2017-08-23 DIAGNOSIS — R69 Illness, unspecified: Secondary | ICD-10-CM | POA: Diagnosis not present

## 2017-08-23 DIAGNOSIS — M0579 Rheumatoid arthritis with rheumatoid factor of multiple sites without organ or systems involvement: Secondary | ICD-10-CM | POA: Diagnosis not present

## 2017-08-23 DIAGNOSIS — Z79899 Other long term (current) drug therapy: Secondary | ICD-10-CM | POA: Diagnosis not present

## 2017-08-23 DIAGNOSIS — I5032 Chronic diastolic (congestive) heart failure: Secondary | ICD-10-CM | POA: Diagnosis not present

## 2017-08-23 DIAGNOSIS — M170 Bilateral primary osteoarthritis of knee: Secondary | ICD-10-CM | POA: Diagnosis not present

## 2017-08-30 ENCOUNTER — Ambulatory Visit: Payer: Self-pay

## 2017-09-06 ENCOUNTER — Ambulatory Visit (INDEPENDENT_AMBULATORY_CARE_PROVIDER_SITE_OTHER): Payer: Medicare HMO

## 2017-09-06 VITALS — BP 128/62 | HR 87 | Temp 98.1°F | Resp 12 | Ht 70.0 in | Wt 186.0 lb

## 2017-09-06 DIAGNOSIS — Z9119 Patient's noncompliance with other medical treatment and regimen: Secondary | ICD-10-CM | POA: Diagnosis not present

## 2017-09-06 DIAGNOSIS — E119 Type 2 diabetes mellitus without complications: Secondary | ICD-10-CM | POA: Diagnosis not present

## 2017-09-06 DIAGNOSIS — M069 Rheumatoid arthritis, unspecified: Secondary | ICD-10-CM

## 2017-09-06 DIAGNOSIS — M2550 Pain in unspecified joint: Secondary | ICD-10-CM

## 2017-09-06 DIAGNOSIS — I5032 Chronic diastolic (congestive) heart failure: Secondary | ICD-10-CM

## 2017-09-06 DIAGNOSIS — Z9181 History of falling: Secondary | ICD-10-CM

## 2017-09-06 DIAGNOSIS — F1010 Alcohol abuse, uncomplicated: Secondary | ICD-10-CM

## 2017-09-06 DIAGNOSIS — F1490 Cocaine use, unspecified, uncomplicated: Secondary | ICD-10-CM

## 2017-09-06 DIAGNOSIS — R52 Pain, unspecified: Secondary | ICD-10-CM | POA: Diagnosis not present

## 2017-09-06 DIAGNOSIS — Z23 Encounter for immunization: Secondary | ICD-10-CM | POA: Diagnosis not present

## 2017-09-06 DIAGNOSIS — Z91199 Patient's noncompliance with other medical treatment and regimen due to unspecified reason: Secondary | ICD-10-CM

## 2017-09-06 DIAGNOSIS — Z1211 Encounter for screening for malignant neoplasm of colon: Secondary | ICD-10-CM | POA: Diagnosis not present

## 2017-09-06 DIAGNOSIS — Z0000 Encounter for general adult medical examination without abnormal findings: Secondary | ICD-10-CM | POA: Diagnosis not present

## 2017-09-06 DIAGNOSIS — Z598 Other problems related to housing and economic circumstances: Secondary | ICD-10-CM

## 2017-09-06 DIAGNOSIS — Z1159 Encounter for screening for other viral diseases: Secondary | ICD-10-CM | POA: Diagnosis not present

## 2017-09-06 DIAGNOSIS — Z599 Problem related to housing and economic circumstances, unspecified: Secondary | ICD-10-CM

## 2017-09-06 DIAGNOSIS — J449 Chronic obstructive pulmonary disease, unspecified: Secondary | ICD-10-CM

## 2017-09-06 NOTE — Patient Instructions (Signed)
Daniel Frye , Thank you for taking time to come for your Medicare Wellness Visit. I appreciate your ongoing commitment to your health goals. Please review the following plan we discussed and let me know if I can assist you in the future.   Screening recommendations/referrals: Colorectal Screening: You will receive a call from our office regarding your appointment  Vision and Dental Exams: Recommended annual ophthalmology exams for early detection of glaucoma and other disorders of the eye Recommended annual dental exams for proper oral hygiene  Diabetic Exams: Recommended annual diabetic eye exams for early detection of retinopathy Recommended annual diabetic foot exams for early detection of peripheral neuropathy.  Diabetic Eye Exam: Please schedule an appointment with your ophthalmologist Diabetic Foot Exam: Please schedule an appointment with Dr. Ronnald Ramp for completion  Vaccinations: Influenza vaccine: Overdue Pneumococcal vaccine: Completed 1 of 2 today Tdap vaccine: Declined. Please call your insurance company to determine your out of pocket expense. You may also receive this vaccine at your local pharmacy or Health Dept. Shingles vaccine: Please call your insurance company to determine your out of pocket expense for the Shingrix vaccine. You may also receive this vaccine at your local pharmacy or Health Dept.  Advanced directives: Advance directive discussed with you today. I have provided a copy for you to complete at home and have notarized. Once this is complete please bring a copy in to our office so we can scan it into your chart.  Goals: Recommend to drink at least 6-8 8oz glasses of water per day.  Next appointment: Please schedule your Annual Wellness Visit with your Nurse Health Advisor in one year.  Please schedule an appointment with Dr. Ronnald Ramp within the next 30 days for follow up after today's Annual Wellness Visit.  Preventive Care 69 Years and Older, Male Preventive  care refers to lifestyle choices and visits with your health care provider that can promote health and wellness. What does preventive care include?  A yearly physical exam. This is also called an annual well check.  Dental exams once or twice a year.  Routine eye exams. Ask your health care provider how often you should have your eyes checked.  Personal lifestyle choices, including:  Daily care of your teeth and gums.  Regular physical activity.  Eating a healthy diet.  Avoiding tobacco and drug use.  Limiting alcohol use.  Practicing safe sex.  Taking low doses of aspirin every day.  Taking vitamin and mineral supplements as recommended by your health care provider. What happens during an annual well check? The services and screenings done by your health care provider during your annual well check will depend on your age, overall health, lifestyle risk factors, and family history of disease. Counseling  Your health care provider may ask you questions about your:  Alcohol use.  Tobacco use.  Drug use.  Emotional well-being.  Home and relationship well-being.  Sexual activity.  Eating habits.  History of falls.  Memory and ability to understand (cognition).  Work and work Statistician. Screening  You may have the following tests or measurements:  Height, weight, and BMI.  Blood pressure.  Lipid and cholesterol levels. These may be checked every 5 years, or more frequently if you are over 69 years old.  Skin check.  Lung cancer screening. You may have this screening every year starting at age 69 if you have a 30-pack-year history of smoking and currently smoke or have quit within the past 15 years.  Fecal occult blood test (FOBT)  of the stool. You may have this test every year starting at age 69.  Flexible sigmoidoscopy or colonoscopy. You may have a sigmoidoscopy every 5 years or a colonoscopy every 10 years starting at age 69.  Prostate cancer  screening. Recommendations will vary depending on your family history and other risks.  Hepatitis C blood test.  Hepatitis B blood test.  Sexually transmitted disease (STD) testing.  Diabetes screening. This is done by checking your blood sugar (glucose) after you have not eaten for a while (fasting). You may have this done every 1-3 years.  Abdominal aortic aneurysm (AAA) screening. You may need this if you are a current or former smoker.  Osteoporosis. You may be screened starting at age 20 if you are at high risk. Talk with your health care provider about your test results, treatment options, and if necessary, the need for more tests. Vaccines  Your health care provider may recommend certain vaccines, such as:  Influenza vaccine. This is recommended every year.  Tetanus, diphtheria, and acellular pertussis (Tdap, Td) vaccine. You may need a Td booster every 10 years.  Zoster vaccine. You may need this after age 69.  Pneumococcal 13-valent conjugate (PCV13) vaccine. One dose is recommended after age 69.  Pneumococcal polysaccharide (PPSV23) vaccine. One dose is recommended after age 69. Talk to your health care provider about which screenings and vaccines you need and how often you need them. This information is not intended to replace advice given to you by your health care provider. Make sure you discuss any questions you have with your health care provider. Document Released: 03/20/2015 Document Revised: 11/11/2015 Document Reviewed: 12/23/2014 Elsevier Interactive Patient Education  2017 [place] Prevention in the Home Falls can cause injuries. They can happen to people of all ages. There are many things you can do to make your home safe and to help prevent falls. What can I do on the outside of my home?  Regularly fix the edges of walkways and driveways and fix any cracks.  Remove anything that might make you trip as you walk through a door, such as a raised  step or threshold.  Trim any bushes or trees on the path to your home.  Use bright outdoor lighting.  Clear any walking paths of anything that might make someone trip, such as rocks or tools.  Regularly check to see if handrails are loose or broken. Make sure that both sides of any steps have handrails.  Any raised decks and porches should have guardrails on the edges.  Have any leaves, snow, or ice cleared regularly.  Use sand or salt on walking paths during winter.  Clean up any spills in your garage right away. This includes oil or grease spills. What can I do in the bathroom?  Use night lights.  Install grab bars by the toilet and in the tub and shower. Do not use towel bars as grab bars.  Use non-skid mats or decals in the tub or shower.  If you need to sit down in the shower, use a plastic, non-slip stool.  Keep the floor dry. Clean up any water that spills on the floor as soon as it happens.  Remove soap buildup in the tub or shower regularly.  Attach bath mats securely with double-sided non-slip rug tape.  Do not have throw rugs and other things on the floor that can make you trip. What can I do in the bedroom?  Use night lights.  Make sure  that you have a light by your bed that is easy to reach.  Do not use any sheets or blankets that are too big for your bed. They should not hang down onto the floor.  Have a firm chair that has side arms. You can use this for support while you get dressed.  Do not have throw rugs and other things on the floor that can make you trip. What can I do in the kitchen?  Clean up any spills right away.  Avoid walking on wet floors.  Keep items that you use a lot in easy-to-reach places.  If you need to reach something above you, use a strong step stool that has a grab bar.  Keep electrical cords out of the way.  Do not use floor polish or wax that makes floors slippery. If you must use wax, use non-skid floor wax.  Do not  have throw rugs and other things on the floor that can make you trip. What can I do with my stairs?  Do not leave any items on the stairs.  Make sure that there are handrails on both sides of the stairs and use them. Fix handrails that are broken or loose. Make sure that handrails are as long as the stairways.  Check any carpeting to make sure that it is firmly attached to the stairs. Fix any carpet that is loose or worn.  Avoid having throw rugs at the top or bottom of the stairs. If you do have throw rugs, attach them to the floor with carpet tape.  Make sure that you have a light switch at the top of the stairs and the bottom of the stairs. If you do not have them, ask someone to add them for you. What else can I do to help prevent falls?  Wear shoes that:  Do not have high heels.  Have rubber bottoms.  Are comfortable and fit you well.  Are closed at the toe. Do not wear sandals.  If you use a stepladder:  Make sure that it is fully opened. Do not climb a closed stepladder.  Make sure that both sides of the stepladder are locked into place.  Ask someone to hold it for you, if possible.  Clearly mark and make sure that you can see:  Any grab bars or handrails.  First and last steps.  Where the edge of each step is.  Use tools that help you move around (mobility aids) if they are needed. These include:  Canes.  Walkers.  Scooters.  Crutches.  Turn on the lights when you go into a dark area. Replace any light bulbs as soon as they burn out.  Set up your furniture so you have a clear path. Avoid moving your furniture around.  If any of your floors are uneven, fix them.  If there are any pets around you, be aware of where they are.  Review your medicines with your doctor. Some medicines can make you feel dizzy. This can increase your chance of falling. Ask your doctor what other things that you can do to help prevent falls. This information is not intended  to replace advice given to you by your health care provider. Make sure you discuss any questions you have with your health care provider. Document Released: 12/18/2008 Document Revised: 07/30/2015 Document Reviewed: 03/28/2014 Elsevier Interactive Patient Education  2017 [employer].

## 2017-09-06 NOTE — Progress Notes (Signed)
Subjective:   Daniel Frye is a 67 y.o. male who presents for Medicare Annual/Subsequent preventive examination.  Review of Systems:  N/A Cardiac Risk Factors include: advanced age (>80men, >80 women);diabetes mellitus;hypertension;male gender;sedentary lifestyle;smoking/ tobacco exposure     Objective:    Vitals: BP 128/62 (BP Location: Right Arm, Patient Position: Sitting, Cuff Size: Normal)   Pulse 87   Temp 98.1 F (36.7 C) (Oral)   Resp 12   Ht 5\' 10"  (1.778 m)   Wt 186 lb (84.4 kg)   SpO2 95%   BMI 26.69 kg/m   Body mass index is 26.69 kg/m.  Advanced Directives 09/06/2017 04/15/2016 04/15/2016 02/29/2016 02/29/2016 08/10/2015 03/19/2015  Does Patient Have a Medical Advance Directive? No No No No No Yes No  Type of Advance Directive - - - - - Press photographer;Living will -  Does patient want to make changes to medical advance directive? - - - - - No - Patient declined -  Copy of [place] in Chart? - - - - - No - copy requested -  Would patient like information on creating a medical advance directive? Yes (MAU/Ambulatory/Procedural Areas - Information given) Yes (Inpatient - patient requests chaplain consult to create a medical advance directive) - No - Patient declined No - Patient declined - Yes - Educational materials given    Tobacco Social History   Tobacco Use  Smoking Status Current Every Day Smoker  . Packs/day: 0.25  . Years: 51.00  . Pack years: 12.75  . Types: Cigarettes, Cigars  Smokeless Tobacco Never Used     Ready to quit: No Counseling given: Yes  Clinical Intake:  Pre-visit preparation completed: Yes  Pain : No/denies pain   BMI - recorded: 26.69 Nutritional Status: BMI 25 -29 Overweight Nutritional Risks: None  Nutrition Risk Assessment: Has the patient had any N/V/D within the last 2 months?  No Does the patient have any non-healing wounds?  No Has the patient had any unintentional weight loss or weight gain?   No  Is the patient diabetic?  Yes If diabetic, was a CBG obtained today?  No Did the patient bring in their glucometer from home?  No Comments: Pt does not monitor CBG's daily. Denies any financial strains with the device or supplies. States he does not always remember to take his medications. Referral placed for pt to receive complex care management and medication management with [facility].  Diabetic Exams: Diabetic Eye Exam: Overdue for diabetic eye exam. Pt states he is not established with a physician to complete this exam. Pt has been advised about the importance in completing this exam. Referral placed for pt to establish care with ophthalmology. Advised he will receive a call from our office re: his appt. Verbalized acceptance and understanding. Diabetic Foot Exam: Overdue for diabetic foot exam. Pt has been advised about the importance in completing this exam. Advised to schedule an appt with Dr. Ronnald Ramp for completion. Verbalized acceptance and understanding.  How often do you need to have someone help you when you read instructions, pamphlets, or other written materials from your doctor or pharmacy?: 1 - Never  Interpreter Needed?: No  Information entered by :: AEVersole, LPN  Past Medical History:  Diagnosis Date  . Alcohol abuse   . CHF (congestive heart failure) ([place])   . Cocaine use   . COPD (chronic obstructive pulmonary disease) ([place])   . Depression   . Diabetes mellitus without complication ([place])   . Hypertension   .  Marijuana use   . Rheumatoid arteritis 1989   Took shots for awhile but not now  . Rheumatoid arthritis ([place])   . Substance abuse ([place])   . Tobacco use    Past Surgical History:  Procedure Laterality Date  . heart surgery to drain fluid N/A 1987   Heart surgery to remove fluid    Family History  Problem Relation Age of Onset  . COPD Brother   . Arthritis Brother   . Diabetes Brother   . Hypertension Brother    Social History   Socioeconomic History   . Marital status: Single    Spouse name: Not on file  . Number of children: 2  . Years of education: Not on file  . Highest education level: 9th grade  Occupational History  . Occupation: Retired  Scientific laboratory technician  . Financial resource strain: Somewhat hard  . Food insecurity:    Worry: Sometimes true    Inability: Sometimes true  . Transportation needs:    Medical: No    Non-medical: No  Tobacco Use  . Smoking status: Current Every Day Smoker    Packs/day: 0.25    Years: 51.00    Pack years: 12.75    Types: Cigarettes, Cigars  . Smokeless tobacco: Never Used  Substance and Sexual Activity  . Alcohol use: Yes    Alcohol/week: 36.6 oz    Types: 12 Cans of beer, 49 Standard drinks or equivalent per week    Comment: Drinks 1/5 day of wine   . Drug use: Yes    Types: "Crack" cocaine, Marijuana    Comment: Uses only once in awhile and does not look for it but has hard time when it is there tunrning it down.   Marland Kitchen Sexual activity: Not Currently  Lifestyle  . Physical activity:    Days per week: 0 days    Minutes per session: 0 min  . Stress: Not at all  Relationships  . Social connections:    Talks on phone: Patient refused    Gets together: Patient refused    Attends religious service: Patient refused    Active member of club or organization: Patient refused    Attends meetings of clubs or organizations: Patient refused    Relationship status: Patient refused  Other Topics Concern  . Not on file  Social History Narrative  . Not on file    Outpatient Encounter Medications as of 09/06/2017  Medication Sig  . albuterol (PROVENTIL HFA;VENTOLIN HFA) 108 (90 Base) MCG/ACT inhaler INHALE 2 PUFFS BY MOUTH EVERY 4 HOURS AS NEEDED FOR WHEEZING/ SHORTNESS OF BREATH  . carvedilol (COREG) 6.25 MG tablet Take 1 tablet (6.25 mg total) by mouth 2 (two) times daily with a meal.  . citalopram (CELEXA) 20 MG tablet Take 20 mg by mouth daily.  . furosemide (LASIX) 40 MG tablet Take 1 tablet  (40 mg total) by mouth daily.  Marland Kitchen ketorolac (TORADOL) 10 MG tablet Take 1 tablet (10 mg total) by mouth every 8 (eight) hours as needed.  Marland Kitchen lisinopril (PRINIVIL,ZESTRIL) 40 MG tablet Take 1 tablet (40 mg total) by mouth daily at 2 PM.  . folic acid (FOLVITE) 1 MG tablet Take 1 tablet (1 mg total) by mouth daily. (Patient not taking: Reported on 01/30/2017)  . Multiple Vitamin (MULTIVITAMIN WITH MINERALS) TABS tablet Take 1 tablet by mouth daily. (Patient not taking: Reported on 06/30/2017)   No facility-administered encounter medications on file as of 09/06/2017.     Activities of  Daily Living In your present state of health, do you have any difficulty performing the following activities: 09/06/2017  Hearing? N  Comment denies hearing aids  Vision? Y  Comment wears eyeglasses  Difficulty concentrating or making decisions? Y  Comment short and long term memory loss  Walking or climbing stairs? Y  Comment dyspnea and joint pain  Dressing or bathing? N  Doing errands, shopping? N  Preparing Food and eating ? N  Comment upper and lower dentures  Using the Toilet? N  In the past six months, have you accidently leaked urine? N  Do you have problems with loss of bowel control? N  Managing your Medications? Y  Comment states he cannot remember to take his medications  Managing your Finances? N  Housekeeping or managing your Housekeeping? Y  Comment unable to care for home  Some recent data might be hidden    Patient Care Team: Juline Patch, MD as PCP - General (Family Medicine) Isaias Cowman, MD as Consulting Physician (Cardiology) Marlowe Sax, MD as Referring Physician (Rheumatology)   Assessment:   This is a routine wellness examination for Kristof.  Exercise Activities and Dietary recommendations Current Exercise Habits: The patient does not participate in regular exercise at present, Exercise limited by: cardiac condition(s);respiratory conditions(s);Other - see  comments  Goals    . DIET - INCREASE WATER INTAKE     Recommend to drink at least 6-8 8oz glasses of water per day.    . Prevent falls     Recommend to remove any items from the home that may cause slips or trips.       Fall Risk Fall Risk  09/06/2017 01/30/2017 01/22/2016 06/25/2015 06/04/2015  Falls in the past year? No No Yes Yes Yes  Number falls in past yr: - - 2 or more 2 or more 2 or more  Comment - - - last 3-4 weeks, falls no warning  -  Injury with Fall? - - No Yes No  Comment - - - bruises  -  Risk Factor Category  - - High Fall Risk High Fall Risk -  Risk for fall due to : Impaired vision;Impaired balance/gait;Medication side effect;Other (Comment);History of fall(s) - History of fall(s) - -  Follow up - - Falls evaluation completed Falls prevention discussed -   FALL RISK PREVENTION PERTAINING TO HOME: Is your home free of loose throw rugs in walkways, pet beds, electrical cords, etc? Yes Is there adequate lighting in your home to reduce risk of falls?  Yes Are there stairs in or around your home WITH handrails? Yes  ASSISTIVE DEVICES UTILIZED TO PREVENT FALLS: Use of a cane, walker or w/c? Yes, walker Grab bars in the bathroom? No  Shower chair or a place to sit while bathing? No An elevated toilet seat or a handicapped toilet? No  Timed Get Up and Go Performed: Yes. Pt ambulated 10 feet within 32 sec. Gait slow, unsteady and with the use of an assistive device. Pt would benefit from PT services to assist with strengthening. Referral placed for C3 and [facility] complex care managment. Fall risk prevention has been discussed.  Community Resource Referral:  Data processing manager Referral sent to Care Guide for installation of grab bars in the shower, shower chair or an elevated toilet seat.  Depression Screen PHQ 2/9 Scores 09/06/2017 01/30/2017 01/22/2016 06/04/2015  PHQ - 2 Score 2 6 6 2   PHQ- 9 Score 13 27 24 8     Cognitive Function  6CIT Screen 09/06/2017  What  Year? 0 points  What month? 0 points  What time? 3 points  Count back from 20 0 points  Months in reverse 4 points  Repeat phrase 2 points  Total Score 9    Immunization History  Administered Date(s) Administered  . Influenza,inj,Quad PF,6+ Mos 11/26/2014  . Influenza-Unspecified 12/06/2015  . PPD Test 02/22/2008  . Pneumococcal Conjugate-13 09/06/2017    Qualifies for Shingles Vaccine? Yes. Due for Shingrix. Education has been provided regarding the importance of this vaccine. Pt has been advised to call his insurance company to determine his out of pocket expense. Advised he may also receive this vaccine at his local pharmacy or Health Dept. Verbalized acceptance and understanding.  Overdue for Flu vaccine. Education has been provided regarding the importance of this vaccine and advised to receive when available. Verbalized acceptance and understanding.  Due for Tdap vaccine. Education has been provided regarding the importance of this vaccine. Pt has been advised he may receive this vaccine at his local pharmacy or Health Dept. Also advised to provide a copy of his vaccination record if he chooses to receive this vaccine at his local pharmacy. Verbalized acceptance and understanding.  Screening Tests Health Maintenance  Topic Date Due  . Hepatitis C Screening  01/27/1951  . FOOT EXAM  07/31/1958  . OPHTHALMOLOGY EXAM  07/31/1958  . COLONOSCOPY  07/31/1998  . HEMOGLOBIN A1C  03/27/2016  . INFLUENZA VACCINE  12/05/2017 (Originally 10/05/2017)  . TETANUS/TDAP  09/07/2018 (Originally 07/31/1967)  . PNA vac Low Risk Adult (2 of 2 - PPSV23) 09/07/2018   Cancer Screenings: Lung: Low Dose CT Chest recommended if Age 45-80 years, 30 pack-year currently smoking OR have quit w/in 15years. Patient does not qualify. Colorectal: Referral placed for pt to be scheduled with GI for completion of screening colonoscopy  Additional Screenings: Hepatitis C Screening: Ordered today     Plan:  I  have personally reviewed and addressed the Medicare Annual Wellness questionnaire and have noted the following in the patient's chart:  A. Medical and social history B. Use of alcohol, tobacco or illicit drugs  C. Current medications and supplements D. Functional ability and status E.  Nutritional status F.  Physical activity G. Advance directives H. List of other physicians I.  Hospitalizations, surgeries, and ER visits in previous 12 months J.  [place] such as hearing and vision if needed, cognitive and depression L. Referrals and appointments  In addition, I have reviewed and discussed with patient certain preventive protocols, quality metrics, and best practice recommendations. A written personalized care plan for preventive services as well as general preventive health recommendations were provided to patient.  Signed,  Aleatha Borer, LPN Nurse Health Advisor  MD Recommendations: Due for Shingrix. Education has been provided regarding the importance of this vaccine. Pt has been advised to call his insurance company to determine his out of pocket expense. Advised he may also receive this vaccine at his local pharmacy or Health Dept. Verbalized acceptance and understanding.  Overdue for Flu vaccine. Education has been provided regarding the importance of this vaccine and advised to receive when available. Verbalized acceptance and understanding.  Due for Tdap vaccine. Education has been provided regarding the importance of this vaccine. Pt has been advised he may receive this vaccine at his local pharmacy or Health Dept. Also advised to provide a copy of his vaccination record if he chooses to receive this vaccine at his local pharmacy. Verbalized acceptance and understanding.  Diabetic Eye Exam: Overdue for diabetic eye exam. Pt states he is not established with a physician to complete this exam. Pt has been advised about the importance in completing this exam. Referral  placed for pt to establish care with ophthalmology. Advised he will receive a call from our office re: his appt. Verbalized acceptance and understanding.  Diabetic Foot Exam: Overdue for diabetic foot exam. Pt has been advised about the importance in completing this exam. Advised to schedule an appt with Dr. Ronnald Ramp for completion. Verbalized acceptance and understanding.  Fall Risk: Pt would benefit from PT services to assist with strengthening. Referral placed for C3 and [facility] complex care managment. Fall risk prevention has been discussed. Community Resource Referral also sent to Care Guide for installation of grab bars in the shower, shower chair or an elevated toilet seat.  Colorectal: Referral placed for pt to be scheduled with GI for completion of screening colonoscopy  Hepatitis C Screening: Ordered today   Financial Strains: C3 referral placed to assist pt with cost of screenings and medical bills

## 2017-09-08 DIAGNOSIS — Z1159 Encounter for screening for other viral diseases: Secondary | ICD-10-CM | POA: Diagnosis not present

## 2017-09-09 LAB — HEPATITIS C ANTIBODY: Hep C Virus Ab: <0.1 s/co ratio (ref 0.0–0.9)

## 2017-09-13 ENCOUNTER — Other Ambulatory Visit: Payer: Self-pay | Admitting: *Deleted

## 2017-09-13 NOTE — Patient Outreach (Signed)
Daniel Frye [facility]) Care Management  09/13/2017  Daniel Frye April 26, 1950 [id number]  Telephone Screen  Referral Date: 09/06/17 Referral Source: MD referral from [facility]  Referral Reason:  medication managment   Oval attempt # 1 successful to Daniel [facility] Number listed in Epic  Patient is able to verify HIPAA Reviewed and addressed referral to [facility] with patient Daniel Daniel Frye denies concerns with Medication management on today's call He states he went to visit his MD recently and all his issues were resolved He discussed difficulty walking and wanting percocet prior to a future injection by a pending a call from Dr Daniel Frye about a rheumatologist referral "so I can walk better."   CM referred him to his primary MD or pain management provider for his reported need for percocet so he "can walk" CM discussed that [place] may not be able to assist with that request related to Percocet being a controlled medication.  He inquired about possible alternative medications than controlled medications.  CM again referred him to his MD but discussed ultram, flexeril, etc  Daniel Frye reports he was getting shots that last for 2-8+ years  He states he is not good with names but was seen by a male MD in [place]. 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  He reported his inhaler was costly at $43 but then stated he could call Dr  Daniel Frye to get albuterol nebulizer which he states are cheaper for him  He expresses that he may need assist at a later time if he is referred for "shots from the rheumatologist"  Denies concerns with cost of "blood pressure medicines" When RN CM questioned Daniel Frye x 3 he continued to denied need of assistance from [place]. Last flu shot in 2017   Appointments: Last seen in MD office on 09/06/17  09/26/17 1400 MD appointment with Dr Daniel Frye   Advance Directives: Denies interest or needs for changes related to advanced directives   Consent: [facility] RN CM reviewed [facility] services with patient. Patient need of [facility] services at this time for medications, transportation,  (although he admits to feeling depressed at times related to his medical conditions- presently on celexa) , advance directives, DME, etc.  He thanked [facility] RN CM for calling to check on him  Plan: [facility] RN CM will close case at this time as patient has been assessed and no needs identified.    Daniel Frye L. Lavina Hamman, RN, BSN, CCM [facility] Telephonic Care Management Care Coordinator Direct number [phone]  [facility] number [phone] Fax number [phone]

## 2017-09-14 ENCOUNTER — Other Ambulatory Visit: Payer: Self-pay

## 2017-09-14 DIAGNOSIS — Z1211 Encounter for screening for malignant neoplasm of colon: Secondary | ICD-10-CM

## 2017-09-25 ENCOUNTER — Telehealth: Payer: Self-pay | Admitting: Gastroenterology

## 2017-09-25 MED ORDER — PEG 3350-KCL-NABCB-NACL-NASULF 236 G PO SOLR: 4000.0000 mL | Freq: Once | ORAL | 0 refills | Status: AC

## 2017-09-25 NOTE — Telephone Encounter (Signed)
Rescheduled colonoscopy to 10/10/17 due to cost of bowel prep notified trish in endo updated referral and changed rx to [place].  Thanks [employer]

## 2017-09-25 NOTE — Telephone Encounter (Signed)
Patient states prep is too expensive and needs to reschedule colon from tomorrow at 7.23.19. Patient would like a cheaper prep to be called in and to schedule after 8.3.19.

## 2017-09-26 ENCOUNTER — Ambulatory Visit: Payer: Self-pay | Admitting: Family Medicine

## 2017-10-03 DIAGNOSIS — E119 Type 2 diabetes mellitus without complications: Secondary | ICD-10-CM | POA: Diagnosis not present

## 2017-10-09 ENCOUNTER — Other Ambulatory Visit: Payer: Self-pay

## 2017-10-10 ENCOUNTER — Encounter: Admission: RE | Payer: Self-pay | Source: Ambulatory Visit

## 2017-10-10 ENCOUNTER — Ambulatory Visit: Admission: RE | Admit: 2017-10-10 | Payer: Medicare HMO | Source: Ambulatory Visit | Admitting: Gastroenterology

## 2017-10-10 SURGERY — COLONOSCOPY WITH PROPOFOL
Anesthesia: General

## 2017-10-23 DIAGNOSIS — M5030 Other cervical disc degeneration, unspecified cervical region: Secondary | ICD-10-CM | POA: Diagnosis not present

## 2017-10-23 DIAGNOSIS — M25511 Pain in right shoulder: Secondary | ICD-10-CM | POA: Diagnosis not present

## 2017-10-23 DIAGNOSIS — S0990XA Unspecified injury of head, initial encounter: Secondary | ICD-10-CM | POA: Diagnosis not present

## 2017-10-23 DIAGNOSIS — D320 Benign neoplasm of cerebral meninges: Secondary | ICD-10-CM | POA: Diagnosis not present

## 2017-10-23 DIAGNOSIS — S199XXA Unspecified injury of neck, initial encounter: Secondary | ICD-10-CM | POA: Diagnosis not present

## 2017-10-23 DIAGNOSIS — S4991XA Unspecified injury of right shoulder and upper arm, initial encounter: Secondary | ICD-10-CM | POA: Diagnosis not present

## 2017-10-23 DIAGNOSIS — M47816 Spondylosis without myelopathy or radiculopathy, lumbar region: Secondary | ICD-10-CM | POA: Diagnosis not present

## 2017-10-23 DIAGNOSIS — J449 Chronic obstructive pulmonary disease, unspecified: Secondary | ICD-10-CM | POA: Diagnosis not present

## 2017-10-23 DIAGNOSIS — S99911A Unspecified injury of right ankle, initial encounter: Secondary | ICD-10-CM | POA: Diagnosis not present

## 2017-10-23 DIAGNOSIS — S3992XA Unspecified injury of lower back, initial encounter: Secondary | ICD-10-CM | POA: Diagnosis not present

## 2017-10-23 DIAGNOSIS — S3991XA Unspecified injury of abdomen, initial encounter: Secondary | ICD-10-CM | POA: Diagnosis not present

## 2017-10-23 DIAGNOSIS — M19071 Primary osteoarthritis, right ankle and foot: Secondary | ICD-10-CM | POA: Diagnosis not present

## 2017-10-23 DIAGNOSIS — Z87891 Personal history of nicotine dependence: Secondary | ICD-10-CM | POA: Diagnosis not present

## 2017-10-23 DIAGNOSIS — M069 Rheumatoid arthritis, unspecified: Secondary | ICD-10-CM | POA: Diagnosis not present

## 2017-10-23 DIAGNOSIS — S99922A Unspecified injury of left foot, initial encounter: Secondary | ICD-10-CM | POA: Diagnosis not present

## 2017-10-23 DIAGNOSIS — I129 Hypertensive chronic kidney disease with stage 1 through stage 4 chronic kidney disease, or unspecified chronic kidney disease: Secondary | ICD-10-CM | POA: Diagnosis not present

## 2017-10-23 DIAGNOSIS — N189 Chronic kidney disease, unspecified: Secondary | ICD-10-CM | POA: Diagnosis not present

## 2017-10-23 DIAGNOSIS — M79671 Pain in right foot: Secondary | ICD-10-CM | POA: Diagnosis not present

## 2017-10-23 DIAGNOSIS — Z041 Encounter for examination and observation following transport accident: Secondary | ICD-10-CM | POA: Diagnosis not present

## 2017-10-23 DIAGNOSIS — D329 Benign neoplasm of meninges, unspecified: Secondary | ICD-10-CM | POA: Diagnosis not present

## 2017-10-23 DIAGNOSIS — M25572 Pain in left ankle and joints of left foot: Secondary | ICD-10-CM | POA: Diagnosis not present

## 2017-10-23 DIAGNOSIS — M25571 Pain in right ankle and joints of right foot: Secondary | ICD-10-CM | POA: Diagnosis not present

## 2017-10-23 DIAGNOSIS — E278 Other specified disorders of adrenal gland: Secondary | ICD-10-CM | POA: Diagnosis not present

## 2017-10-23 DIAGNOSIS — M19011 Primary osteoarthritis, right shoulder: Secondary | ICD-10-CM | POA: Diagnosis not present

## 2017-10-23 DIAGNOSIS — M160 Bilateral primary osteoarthritis of hip: Secondary | ICD-10-CM | POA: Diagnosis not present

## 2017-10-23 DIAGNOSIS — M47812 Spondylosis without myelopathy or radiculopathy, cervical region: Secondary | ICD-10-CM | POA: Diagnosis not present

## 2017-10-23 DIAGNOSIS — S3993XA Unspecified injury of pelvis, initial encounter: Secondary | ICD-10-CM | POA: Diagnosis not present

## 2017-10-23 DIAGNOSIS — S299XXA Unspecified injury of thorax, initial encounter: Secondary | ICD-10-CM | POA: Diagnosis not present

## 2017-10-23 DIAGNOSIS — S99912A Unspecified injury of left ankle, initial encounter: Secondary | ICD-10-CM | POA: Diagnosis not present

## 2017-10-23 DIAGNOSIS — M19072 Primary osteoarthritis, left ankle and foot: Secondary | ICD-10-CM | POA: Diagnosis not present

## 2017-10-23 DIAGNOSIS — S99921A Unspecified injury of right foot, initial encounter: Secondary | ICD-10-CM | POA: Diagnosis not present

## 2017-10-23 DIAGNOSIS — M1611 Unilateral primary osteoarthritis, right hip: Secondary | ICD-10-CM | POA: Diagnosis not present

## 2017-12-05 ENCOUNTER — Ambulatory Visit (INDEPENDENT_AMBULATORY_CARE_PROVIDER_SITE_OTHER): Payer: Medicare HMO | Admitting: Family Medicine

## 2017-12-05 ENCOUNTER — Encounter: Payer: Self-pay | Admitting: Family Medicine

## 2017-12-05 VITALS — BP 130/80 | HR 72 | Ht 70.0 in | Wt 175.0 lb

## 2017-12-05 DIAGNOSIS — R7303 Prediabetes: Secondary | ICD-10-CM

## 2017-12-05 DIAGNOSIS — Z23 Encounter for immunization: Secondary | ICD-10-CM | POA: Diagnosis not present

## 2017-12-05 DIAGNOSIS — M069 Rheumatoid arthritis, unspecified: Secondary | ICD-10-CM | POA: Diagnosis not present

## 2017-12-05 DIAGNOSIS — I700 Atherosclerosis of aorta: Secondary | ICD-10-CM | POA: Diagnosis not present

## 2017-12-05 DIAGNOSIS — R69 Illness, unspecified: Secondary | ICD-10-CM | POA: Diagnosis not present

## 2017-12-05 DIAGNOSIS — I5032 Chronic diastolic (congestive) heart failure: Secondary | ICD-10-CM

## 2017-12-05 DIAGNOSIS — I10 Essential (primary) hypertension: Secondary | ICD-10-CM

## 2017-12-05 DIAGNOSIS — J441 Chronic obstructive pulmonary disease with (acute) exacerbation: Secondary | ICD-10-CM

## 2017-12-05 DIAGNOSIS — I2510 Atherosclerotic heart disease of native coronary artery without angina pectoris: Secondary | ICD-10-CM

## 2017-12-05 MED ORDER — CARVEDILOL 6.25 MG PO TABS: 6.2500 mg | ORAL_TABLET | Freq: Two times a day (BID) | ORAL | 6 refills | Status: DC

## 2017-12-05 MED ORDER — FUROSEMIDE 40 MG PO TABS: 40.0000 mg | ORAL_TABLET | Freq: Every day | ORAL | 6 refills | Status: DC

## 2017-12-05 MED ORDER — ALBUTEROL SULFATE HFA 108 (90 BASE) MCG/ACT IN AERS: INHALATION_SPRAY | RESPIRATORY_TRACT | 2 refills | Status: DC

## 2017-12-05 MED ORDER — ALBUTEROL SULFATE (2.5 MG/3ML) 0.083% IN NEBU: 2.5000 mg | INHALATION_SOLUTION | Freq: Four times a day (QID) | RESPIRATORY_TRACT | 12 refills | Status: DC | PRN

## 2017-12-05 MED ORDER — LISINOPRIL 40 MG PO TABS: 40.0000 mg | ORAL_TABLET | Freq: Every day | ORAL | 6 refills | Status: DC

## 2017-12-05 NOTE — Progress Notes (Signed)
Date:  12/05/2017   Name:  Daniel Frye   DOB:  02-Oct-1950   MRN:  [id number]   Chief Complaint: Follow-up (arthritis- needs to get back in with dr Meda Coffee) and COPD (wants neb solution refilled) COPD  There is no chest tightness, cough, difficulty breathing, frequent throat clearing, hemoptysis, hoarse voice, shortness of breath, sputum production or wheezing. This is a recurrent problem. The current episode started in the past 7 days. The problem occurs daily. The problem has been gradually worsening. Pertinent negatives include no appetite change, chest pain, dyspnea on exertion, ear congestion, ear pain, fever, headaches, heartburn, malaise/fatigue, myalgias, nasal congestion, orthopnea, PND, postnasal drip, rhinorrhea, sneezing, sore throat, sweats, trouble swallowing or weight loss. His symptoms are aggravated by nothing. His symptoms are alleviated by beta-agonist. Risk factors for lung disease include smoking/tobacco exposure. His past medical history is significant for COPD and emphysema. There is no history of asthma, bronchiectasis, bronchitis or pneumonia.  Hypertension  This is a chronic problem. The current episode started more than 1 year ago. The problem has been gradually improving since onset. The problem is controlled. Pertinent negatives include no anxiety, blurred vision, chest pain, headaches, malaise/fatigue, neck pain, orthopnea, palpitations, peripheral edema, PND, shortness of breath or sweats. There are no associated agents to hypertension. Risk factors for coronary artery disease include dyslipidemia and male gender. Past treatments include ACE inhibitors, beta blockers and diuretics. The current treatment provides moderate improvement. There are no compliance problems.  There is no history of angina, kidney disease, CAD/MI, CVA, heart failure, left ventricular hypertrophy, PVD or retinopathy. There is no history of chronic renal disease, a hypertension causing med or  renovascular disease.     Review of Systems  Constitutional: Negative for appetite change, chills, fever, malaise/fatigue and weight loss.  HENT: Negative for drooling, ear discharge, ear pain, hoarse voice, postnasal drip, rhinorrhea, sneezing, sore throat and trouble swallowing.   Eyes: Negative for blurred vision.  Respiratory: Negative for cough, hemoptysis, sputum production, choking, chest tightness, shortness of breath and wheezing.   Cardiovascular: Negative for chest pain, dyspnea on exertion, palpitations, orthopnea, leg swelling and PND.  Gastrointestinal: Negative for abdominal pain, blood in stool, constipation, diarrhea, heartburn and nausea.  Endocrine: Negative for polydipsia.  Genitourinary: Negative for dysuria, frequency, hematuria and urgency.  Musculoskeletal: Negative for back pain, myalgias and neck pain.  Skin: Negative for rash.  Allergic/Immunologic: Negative for environmental allergies.  Neurological: Negative for dizziness and headaches.  Hematological: Does not bruise/bleed easily.  Psychiatric/Behavioral: Negative for suicidal ideas. The patient is not nervous/anxious.     Patient Active Problem List   Diagnosis Date Noted  . Hypokalemia 03/03/2016  . Hypomagnesemia 03/03/2016  . Substance induced mood disorder ([place]) 03/02/2016  . Alcohol withdrawal ([place]) 02/29/2016  . Crack cocaine use 01/22/2016  . Obstructive sleep apnea of adult 03/30/2015  . SOB (shortness of breath) on exertion 03/30/2015  . Edema of foot 03/30/2015  . Chronic diastolic CHF (congestive heart failure), NYHA class 2 ([place]) 03/30/2015  . Alcohol abuse 03/23/2015  . Noncompliance 03/23/2015  . Vitamin D deficiency 01/27/2015  . Diabetes mellitus type 2, controlled, without complications ([place]) 28/78/6767  . Obesity (BMI 30.0-34.9) 11/26/2014  . COPD (chronic obstructive pulmonary disease) ([place]) 06/22/2014  . Essential (primary) hypertension 06/22/2014  . Rheumatoid arthritis ([place])  06/22/2014  . Tobacco abuse 06/22/2014  . Chronic obstructive pulmonary disease with acute exacerbation ([place]) 06/22/2014    No Known Allergies  Past Surgical History:  Procedure Laterality  Date  . heart surgery to drain fluid N/A 1987   Heart surgery to remove fluid     Social History   Tobacco Use  . Smoking status: Current Every Day Smoker    Packs/day: 0.25    Years: 51.00    Pack years: 12.75    Types: Cigarettes, Cigars  . Smokeless tobacco: Never Used  Substance Use Topics  . Alcohol use: Yes    Alcohol/week: 61.0 standard drinks    Types: 12 Cans of beer, 49 Standard drinks or equivalent per week    Comment: Drinks 1/5 day of wine   . Drug use: Yes    Types: "Crack" cocaine, Marijuana    Comment: Uses only once in awhile and does not look for it but has hard time when it is there tunrning it down.      Medication list has been reviewed and updated.  Current Meds  Medication Sig  . carvedilol (COREG) 6.25 MG tablet Take 1 tablet (6.25 mg total) by mouth 2 (two) times daily with a meal.  . lisinopril (PRINIVIL,ZESTRIL) 40 MG tablet Take 1 tablet (40 mg total) by mouth daily at 2 PM.  . [DISCONTINUED] albuterol (2.5 MG/3ML) 0.083% NEBU 3 mL, albuterol (5 MG/ML) 0.5% NEBU 0.5 mL Inhale 1 mg into the lungs every 12 (twelve) hours.  . [DISCONTINUED] carvedilol (COREG) 6.25 MG tablet Take 1 tablet (6.25 mg total) by mouth 2 (two) times daily with a meal.  . [DISCONTINUED] lisinopril (PRINIVIL,ZESTRIL) 40 MG tablet Take 1 tablet (40 mg total) by mouth daily at 2 PM.    PHQ 2/9 Scores 12/05/2017 09/13/2017 09/06/2017 01/30/2017  PHQ - 2 Score 0 1 2 6   PHQ- 9 Score 0 - 13 27    Physical Exam  Constitutional: He is oriented to person, place, and time.  HENT:  Head: Normocephalic.  Right Ear: External ear normal.  Left Ear: External ear normal.  Nose: Nose normal.  Mouth/Throat: Oropharynx is clear and moist.  Eyes: Pupils are equal, round, and reactive to light.  Conjunctivae and EOM are normal. Right eye exhibits no discharge. Left eye exhibits no discharge. No scleral icterus.  Neck: Normal range of motion. Neck supple. No JVD present. No tracheal deviation present. No thyromegaly present.  Cardiovascular: Normal rate, regular rhythm, normal heart sounds and intact distal pulses. Exam reveals no gallop and no friction rub.  No murmur heard. Pulmonary/Chest: Breath sounds normal. No respiratory distress. He has no wheezes. He has no rales.  Abdominal: Soft. Bowel sounds are normal. He exhibits no mass. There is no hepatosplenomegaly. There is no tenderness. There is no rebound, no guarding and no CVA tenderness.  Musculoskeletal: Normal range of motion. He exhibits no edema or tenderness.  Lymphadenopathy:    He has no cervical adenopathy.  Neurological: He is alert and oriented to person, place, and time. He has normal strength and normal reflexes. No cranial nerve deficit.  Skin: Skin is warm. No rash noted.  Nursing note and vitals reviewed.   BP 130/80   Pulse 72   Ht 5\' 10"  (1.778 m)   Wt 175 lb (79.4 kg)   BMI 25.11 kg/m   Assessment and Plan:  1. Essential (primary) hypertension Stable on meds- continue carvedilol BID/ draw renal panel - carvedilol (COREG) 6.25 MG tablet; Take 1 tablet (6.25 mg total) by mouth 2 (two) times daily with a meal.  Dispense: 60 tablet; Refill: 6 - Renal Function Panel  2. COPD exacerbation ([place]) Doing well  on nebulizer solution/ refilled along with nebulizer inhaler - albuterol (PROVENTIL HFA;VENTOLIN HFA) 108 (90 Base) MCG/ACT inhaler; INHALE 2 PUFFS BY MOUTH EVERY 4 HOURS AS NEEDED FOR WHEEZING/ SHORTNESS OF BREATH  Dispense: 90 g; Refill: 2 - albuterol (PROVENTIL) (2.5 MG/3ML) 0.083% nebulizer solution; Take 3 mLs (2.5 mg total) by nebulization every 6 (six) hours as needed for wheezing or shortness of breath.  Dispense: 75 mL; Refill: 12  3. Rheumatoid arthritis, involving unspecified site, unspecified  rheumatoid factor presence ([place]) Sent back to Dr Meda Coffee- scheduled appt for next week/ gave appt to patient  4. Prediabetes Draw A1C - Hemoglobin A1c  5. Aortic atherosclerosis (HCC) Smoking cessation discussed  6. Atherosclerosis of coronary artery of native heart without angina pectoris, unspecified vessel or lesion type Smoking cessation discussed  7. Chronic diastolic heart failure (HCC) Stable on meds- refill lisinopril, carvedilol and furosemide. Discussed smoking cessation - lisinopril (PRINIVIL,ZESTRIL) 40 MG tablet; Take 1 tablet (40 mg total) by mouth daily at 2 PM.  Dispense: 30 tablet; Refill: 6 - furosemide (LASIX) 40 MG tablet; Take 1 tablet (40 mg total) by mouth daily.  Dispense: 30 tablet; Refill: 6 - carvedilol (COREG) 6.25 MG tablet; Take 1 tablet (6.25 mg total) by mouth 2 (two) times daily with a meal.  Dispense: 60 tablet; Refill: 6  8. Flu vaccine need administered - Flu vaccine HIGH DOSE PF (Fluzone High dose)  Dr. [facility]  12/05/2017

## 2017-12-06 ENCOUNTER — Ambulatory Visit: Payer: Self-pay | Admitting: Family Medicine

## 2017-12-06 LAB — RENAL FUNCTION PANEL
Albumin: 3.5 g/dL — ABNORMAL LOW (ref 3.6–4.8)
BUN/Creatinine Ratio: 14 (ref 10–24)
BUN: 19 mg/dL (ref 8–27)
CO2: 20 mmol/L (ref 20–29)
Calcium: 9.6 mg/dL (ref 8.6–10.2)
Chloride: 103 mmol/L (ref 96–106)
Creatinine, Ser: 1.32 mg/dL — ABNORMAL HIGH (ref 0.76–1.27)
GFR calc Af Amer: 63 mL/min/{1.73_m2} (ref >59)
GFR calc non Af Amer: 55 mL/min/{1.73_m2} — ABNORMAL LOW (ref >59)
Glucose: 127 mg/dL — ABNORMAL HIGH (ref 65–99)
Phosphorus: 3.3 mg/dL (ref 2.5–4.5)
Potassium: 4.1 mmol/L (ref 3.5–5.2)
Sodium: 141 mmol/L (ref 134–144)

## 2017-12-06 LAB — HEMOGLOBIN A1C
Est. average glucose Bld gHb Est-mCnc: 117 mg/dL
Hgb A1c MFr Bld: 5.7 % — ABNORMAL HIGH (ref 4.8–5.6)

## 2017-12-13 DIAGNOSIS — Z79899 Other long term (current) drug therapy: Secondary | ICD-10-CM | POA: Diagnosis not present

## 2017-12-13 DIAGNOSIS — I5032 Chronic diastolic (congestive) heart failure: Secondary | ICD-10-CM | POA: Diagnosis not present

## 2017-12-13 DIAGNOSIS — M170 Bilateral primary osteoarthritis of knee: Secondary | ICD-10-CM | POA: Diagnosis not present

## 2017-12-13 DIAGNOSIS — R69 Illness, unspecified: Secondary | ICD-10-CM | POA: Diagnosis not present

## 2017-12-13 DIAGNOSIS — M0579 Rheumatoid arthritis with rheumatoid factor of multiple sites without organ or systems involvement: Secondary | ICD-10-CM | POA: Diagnosis not present

## 2018-01-29 DIAGNOSIS — M0579 Rheumatoid arthritis with rheumatoid factor of multiple sites without organ or systems involvement: Secondary | ICD-10-CM | POA: Diagnosis not present

## 2018-01-30 ENCOUNTER — Telehealth: Payer: Self-pay

## 2018-01-30 NOTE — Telephone Encounter (Signed)
LM with family that patient needs to call. I also tried cell and no VM. Patient needs to go get the refills for Lisinopril and other meds at [facility] filled in order to close [facility] in care .

## 2018-02-12 DIAGNOSIS — M0579 Rheumatoid arthritis with rheumatoid factor of multiple sites without organ or systems involvement: Secondary | ICD-10-CM | POA: Diagnosis not present

## 2018-03-13 DIAGNOSIS — Z79899 Other long term (current) drug therapy: Secondary | ICD-10-CM | POA: Diagnosis not present

## 2018-03-13 DIAGNOSIS — R69 Illness, unspecified: Secondary | ICD-10-CM | POA: Diagnosis not present

## 2018-03-13 DIAGNOSIS — R200 Anesthesia of skin: Secondary | ICD-10-CM | POA: Insufficient documentation

## 2018-03-13 DIAGNOSIS — M0579 Rheumatoid arthritis with rheumatoid factor of multiple sites without organ or systems involvement: Secondary | ICD-10-CM | POA: Diagnosis not present

## 2018-03-13 DIAGNOSIS — M170 Bilateral primary osteoarthritis of knee: Secondary | ICD-10-CM | POA: Diagnosis not present

## 2018-03-13 DIAGNOSIS — I5032 Chronic diastolic (congestive) heart failure: Secondary | ICD-10-CM | POA: Diagnosis not present

## 2018-03-13 DIAGNOSIS — R202 Paresthesia of skin: Secondary | ICD-10-CM | POA: Insufficient documentation

## 2018-03-31 DIAGNOSIS — I110 Hypertensive heart disease with heart failure: Secondary | ICD-10-CM | POA: Diagnosis not present

## 2018-03-31 DIAGNOSIS — R201 Hypoesthesia of skin: Secondary | ICD-10-CM | POA: Diagnosis not present

## 2018-03-31 DIAGNOSIS — Z79899 Other long term (current) drug therapy: Secondary | ICD-10-CM | POA: Diagnosis not present

## 2018-03-31 DIAGNOSIS — M0600 Rheumatoid arthritis without rheumatoid factor, unspecified site: Secondary | ICD-10-CM | POA: Diagnosis not present

## 2018-03-31 DIAGNOSIS — R9431 Abnormal electrocardiogram [ECG] [EKG]: Secondary | ICD-10-CM | POA: Diagnosis not present

## 2018-03-31 DIAGNOSIS — I509 Heart failure, unspecified: Secondary | ICD-10-CM | POA: Diagnosis not present

## 2018-03-31 DIAGNOSIS — Z87891 Personal history of nicotine dependence: Secondary | ICD-10-CM | POA: Diagnosis not present

## 2018-03-31 DIAGNOSIS — J449 Chronic obstructive pulmonary disease, unspecified: Secondary | ICD-10-CM | POA: Diagnosis not present

## 2018-03-31 DIAGNOSIS — R69 Illness, unspecified: Secondary | ICD-10-CM | POA: Diagnosis not present

## 2018-03-31 DIAGNOSIS — R531 Weakness: Secondary | ICD-10-CM | POA: Diagnosis not present

## 2018-04-01 DIAGNOSIS — R69 Illness, unspecified: Secondary | ICD-10-CM | POA: Diagnosis not present

## 2018-04-01 DIAGNOSIS — F32A Depression, unspecified: Secondary | ICD-10-CM | POA: Insufficient documentation

## 2018-04-12 DIAGNOSIS — G9389 Other specified disorders of brain: Secondary | ICD-10-CM | POA: Diagnosis not present

## 2018-04-12 DIAGNOSIS — R69 Illness, unspecified: Secondary | ICD-10-CM | POA: Diagnosis not present

## 2018-04-12 DIAGNOSIS — E1122 Type 2 diabetes mellitus with diabetic chronic kidney disease: Secondary | ICD-10-CM | POA: Diagnosis not present

## 2018-04-12 DIAGNOSIS — R201 Hypoesthesia of skin: Secondary | ICD-10-CM | POA: Diagnosis not present

## 2018-04-12 DIAGNOSIS — M4802 Spinal stenosis, cervical region: Secondary | ICD-10-CM | POA: Diagnosis not present

## 2018-04-12 DIAGNOSIS — I129 Hypertensive chronic kidney disease with stage 1 through stage 4 chronic kidney disease, or unspecified chronic kidney disease: Secondary | ICD-10-CM | POA: Diagnosis not present

## 2018-04-12 DIAGNOSIS — N189 Chronic kidney disease, unspecified: Secondary | ICD-10-CM | POA: Diagnosis not present

## 2018-04-12 DIAGNOSIS — Z791 Long term (current) use of non-steroidal anti-inflammatories (NSAID): Secondary | ICD-10-CM | POA: Diagnosis not present

## 2018-04-12 DIAGNOSIS — R200 Anesthesia of skin: Secondary | ICD-10-CM | POA: Diagnosis not present

## 2018-04-12 DIAGNOSIS — M47812 Spondylosis without myelopathy or radiculopathy, cervical region: Secondary | ICD-10-CM | POA: Diagnosis not present

## 2018-04-12 DIAGNOSIS — M79643 Pain in unspecified hand: Secondary | ICD-10-CM | POA: Diagnosis not present

## 2018-04-12 DIAGNOSIS — M069 Rheumatoid arthritis, unspecified: Secondary | ICD-10-CM | POA: Diagnosis not present

## 2018-04-12 DIAGNOSIS — R202 Paresthesia of skin: Secondary | ICD-10-CM | POA: Diagnosis not present

## 2018-04-12 DIAGNOSIS — M79641 Pain in right hand: Secondary | ICD-10-CM | POA: Diagnosis not present

## 2018-04-12 DIAGNOSIS — Z87891 Personal history of nicotine dependence: Secondary | ICD-10-CM | POA: Diagnosis not present

## 2018-04-12 DIAGNOSIS — R258 Other abnormal involuntary movements: Secondary | ICD-10-CM | POA: Diagnosis not present

## 2018-04-12 DIAGNOSIS — R531 Weakness: Secondary | ICD-10-CM | POA: Diagnosis not present

## 2018-05-15 ENCOUNTER — Ambulatory Visit (INDEPENDENT_AMBULATORY_CARE_PROVIDER_SITE_OTHER): Payer: Medicare HMO | Admitting: Family Medicine

## 2018-05-15 ENCOUNTER — Other Ambulatory Visit: Payer: Self-pay

## 2018-05-15 ENCOUNTER — Encounter: Payer: Self-pay | Admitting: Family Medicine

## 2018-05-15 VITALS — BP 132/90 | HR 80 | Ht 70.0 in | Wt 170.0 lb

## 2018-05-15 DIAGNOSIS — Z7289 Other problems related to lifestyle: Secondary | ICD-10-CM | POA: Diagnosis not present

## 2018-05-15 DIAGNOSIS — D329 Benign neoplasm of meninges, unspecified: Secondary | ICD-10-CM

## 2018-05-15 DIAGNOSIS — R69 Illness, unspecified: Secondary | ICD-10-CM | POA: Diagnosis not present

## 2018-05-15 DIAGNOSIS — Z789 Other specified health status: Secondary | ICD-10-CM

## 2018-05-15 DIAGNOSIS — J441 Chronic obstructive pulmonary disease with (acute) exacerbation: Secondary | ICD-10-CM

## 2018-05-15 DIAGNOSIS — E278 Other specified disorders of adrenal gland: Secondary | ICD-10-CM

## 2018-05-15 DIAGNOSIS — Z742 Need for assistance at home and no other household member able to render care: Secondary | ICD-10-CM

## 2018-05-15 DIAGNOSIS — I10 Essential (primary) hypertension: Secondary | ICD-10-CM

## 2018-05-15 DIAGNOSIS — I700 Atherosclerosis of aorta: Secondary | ICD-10-CM | POA: Diagnosis not present

## 2018-05-15 DIAGNOSIS — Z9119 Patient's noncompliance with other medical treatment and regimen: Secondary | ICD-10-CM | POA: Diagnosis not present

## 2018-05-15 DIAGNOSIS — E782 Mixed hyperlipidemia: Secondary | ICD-10-CM | POA: Diagnosis not present

## 2018-05-15 DIAGNOSIS — I5032 Chronic diastolic (congestive) heart failure: Secondary | ICD-10-CM

## 2018-05-15 DIAGNOSIS — F17210 Nicotine dependence, cigarettes, uncomplicated: Secondary | ICD-10-CM

## 2018-05-15 DIAGNOSIS — F320 Major depressive disorder, single episode, mild: Secondary | ICD-10-CM

## 2018-05-15 DIAGNOSIS — F1090 Alcohol use, unspecified, uncomplicated: Secondary | ICD-10-CM

## 2018-05-15 DIAGNOSIS — E279 Disorder of adrenal gland, unspecified: Secondary | ICD-10-CM

## 2018-05-15 DIAGNOSIS — Z91199 Patient's noncompliance with other medical treatment and regimen due to unspecified reason: Secondary | ICD-10-CM

## 2018-05-15 MED ORDER — CARVEDILOL 6.25 MG PO TABS: 6.2500 mg | ORAL_TABLET | Freq: Two times a day (BID) | ORAL | 6 refills | Status: DC

## 2018-05-15 MED ORDER — ADULT MULTIVITAMIN W/MINERALS CH: 1.0000 | ORAL_TABLET | Freq: Every day | ORAL | 5 refills | Status: DC

## 2018-05-15 MED ORDER — FOLIC ACID 1 MG PO TABS: 1.0000 mg | ORAL_TABLET | Freq: Every day | ORAL | 5 refills | Status: DC

## 2018-05-15 MED ORDER — LISINOPRIL 40 MG PO TABS: 40.0000 mg | ORAL_TABLET | Freq: Every day | ORAL | 6 refills | Status: DC

## 2018-05-15 MED ORDER — CITALOPRAM HYDROBROMIDE 20 MG PO TABS: 20.0000 mg | ORAL_TABLET | Freq: Every day | ORAL | 6 refills | Status: DC

## 2018-05-15 MED ORDER — ALBUTEROL SULFATE HFA 108 (90 BASE) MCG/ACT IN AERS: INHALATION_SPRAY | RESPIRATORY_TRACT | 2 refills | Status: DC

## 2018-05-15 MED ORDER — ALBUTEROL SULFATE (2.5 MG/3ML) 0.083% IN NEBU: 2.5000 mg | INHALATION_SOLUTION | Freq: Four times a day (QID) | RESPIRATORY_TRACT | 12 refills | Status: DC | PRN

## 2018-05-15 NOTE — Progress Notes (Signed)
    Date:  05/15/2018   Name:  Daniel Frye   DOB:  [birth year]   MRN:  [id number]   Chief Complaint: face to face (home health for " i need help with preparing food and washing myself")  HPI  Review of Systems  Patient Active Problem List   Diagnosis Date Noted  . Hypokalemia 03/03/2016  . Hypomagnesemia 03/03/2016  . Substance induced mood disorder ([place]) 03/02/2016  . Alcohol withdrawal ([place]) 02/29/2016  . Crack cocaine use 01/22/2016  . Obstructive sleep apnea of adult 03/30/2015  . SOB (shortness of breath) on exertion 03/30/2015  . Edema of foot 03/30/2015  . Chronic diastolic CHF (congestive heart failure), NYHA class 2 ([place]) 03/30/2015  . Alcohol abuse 03/23/2015  . Noncompliance 03/23/2015  . Vitamin D deficiency 01/27/2015  . Diabetes mellitus type 2, controlled, without complications ([place]) 95/28/4132  . Obesity (BMI 30.0-34.9) 11/26/2014  . COPD (chronic obstructive pulmonary disease) ([place]) 06/22/2014  . Essential (primary) hypertension 06/22/2014  . Rheumatoid arthritis ([place]) 06/22/2014  . Tobacco abuse 06/22/2014  . Chronic obstructive pulmonary disease with acute exacerbation ([place]) 06/22/2014    No Known Allergies  Past Surgical History:  Procedure Laterality Date  . heart surgery to drain fluid N/A 1987   Heart surgery to remove fluid     Social History   Tobacco Use  . Smoking status: Current Every Day Smoker    Packs/day: 0.25    Years: 51.00    Pack years: 12.75    Types: Cigarettes, Cigars  . Smokeless tobacco: Never Used  Substance Use Topics  . Alcohol use: Yes    Alcohol/week: 61.0 standard drinks    Types: 12 Cans of beer, 49 Standard drinks or equivalent per week    Comment: Drinks 1/5 day of wine   . Drug use: Yes    Types: "Crack" cocaine, Marijuana    Comment: Uses only once in awhile and does not look for it but has hard time when it is there tunrning it down.      Medication list has been reviewed and updated.  Current Meds    Medication Sig  . albuterol (PROVENTIL HFA;VENTOLIN HFA) 108 (90 Base) MCG/ACT inhaler INHALE 2 PUFFS BY MOUTH EVERY 4 HOURS AS NEEDED FOR WHEEZING/ SHORTNESS OF BREATH  . albuterol (PROVENTIL) (2.5 MG/3ML) 0.083% nebulizer solution Take 3 mLs (2.5 mg total) by nebulization every 6 (six) hours as needed for wheezing or shortness of breath.  . citalopram (CELEXA) 20 MG tablet Take 20 mg by mouth daily.  Marland Kitchen lisinopril (PRINIVIL,ZESTRIL) 40 MG tablet Take 1 tablet (40 mg total) by mouth daily at 2 PM.  . [DISCONTINUED] Multiple Vitamin (MULTIVITAMIN WITH MINERALS) TABS tablet Take 1 tablet by mouth daily.    PHQ 2/9 Scores 12/05/2017 09/13/2017 09/06/2017 01/30/2017  PHQ - 2 Score 0 1 2 6   PHQ- 9 Score 0 - 13 27    Physical Exam  Wt Readings from Last 3 Encounters:  05/15/18 170 lb (77.1 kg)  12/05/17 175 lb (79.4 kg)  09/06/17 186 lb (84.4 kg)    BP 132/90   Pulse 80   Ht 5\' 10"  (1.778 m)   Wt 170 lb (77.1 kg)   BMI 24.39 kg/m   Assessment and Plan:

## 2018-05-15 NOTE — Progress Notes (Signed)
Date:  05/15/2018   Name:  Daniel Frye   DOB:  Jul 04, 1950   MRN:  [id number]   Chief Complaint: face to face (home health for " i need help with preparing food and washing myself")  Hypertension  This is a chronic problem. The current episode started more than 1 year ago. The problem is unchanged. The problem is controlled. Pertinent negatives include no anxiety, blurred vision, chest pain, headaches, malaise/fatigue, neck pain, orthopnea, palpitations, peripheral edema, PND, shortness of breath or sweats. There are no associated agents to hypertension. There are no known risk factors for coronary artery disease. The current treatment provides moderate improvement. There are no compliance problems.  There is no history of angina, kidney disease, CAD/MI, CVA, heart failure, left ventricular hypertrophy, PVD or retinopathy. There is no history of chronic renal disease, a hypertension causing med or renovascular disease.  Congestive Heart Failure  Pertinent negatives include no abdominal pain, chest pain, palpitations or shortness of breath.    Review of Systems  Constitutional: Negative for chills, fever and malaise/fatigue.  HENT: Negative for drooling, ear discharge, ear pain and sore throat.   Eyes: Negative for blurred vision.  Respiratory: Negative for cough, shortness of breath and wheezing.   Cardiovascular: Negative for chest pain, palpitations, orthopnea, leg swelling and PND.  Gastrointestinal: Negative for abdominal pain, blood in stool, constipation, diarrhea and nausea.  Endocrine: Negative for polydipsia.  Genitourinary: Negative for dysuria, frequency, hematuria and urgency.  Musculoskeletal: Negative for back pain, myalgias and neck pain.  Skin: Negative for rash.  Allergic/Immunologic: Negative for environmental allergies.  Neurological: Negative for dizziness and headaches.  Hematological: Does not bruise/bleed easily.  Psychiatric/Behavioral: Negative for suicidal  ideas. The patient is not nervous/anxious.     Patient Active Problem List   Diagnosis Date Noted  . Hypokalemia 03/03/2016  . Hypomagnesemia 03/03/2016  . Substance induced mood disorder ([place]) 03/02/2016  . Alcohol withdrawal ([place]) 02/29/2016  . Crack cocaine use 01/22/2016  . Obstructive sleep apnea of adult 03/30/2015  . SOB (shortness of breath) on exertion 03/30/2015  . Edema of foot 03/30/2015  . Chronic diastolic CHF (congestive heart failure), NYHA class 2 ([place]) 03/30/2015  . Alcohol abuse 03/23/2015  . Noncompliance 03/23/2015  . Vitamin D deficiency 01/27/2015  . Diabetes mellitus type 2, controlled, without complications ([place]) 94/70/9628  . Obesity (BMI 30.0-34.9) 11/26/2014  . COPD (chronic obstructive pulmonary disease) ([place]) 06/22/2014  . Essential (primary) hypertension 06/22/2014  . Rheumatoid arthritis ([place]) 06/22/2014  . Tobacco abuse 06/22/2014  . Chronic obstructive pulmonary disease with acute exacerbation ([place]) 06/22/2014    No Known Allergies  Past Surgical History:  Procedure Laterality Date  . heart surgery to drain fluid N/A 1987   Heart surgery to remove fluid     Social History   Tobacco Use  . Smoking status: Current Every Day Smoker    Packs/day: 0.25    Years: 51.00    Pack years: 12.75    Types: Cigarettes, Cigars  . Smokeless tobacco: Never Used  Substance Use Topics  . Alcohol use: Yes    Alcohol/week: 61.0 standard drinks    Types: 12 Cans of beer, 49 Standard drinks or equivalent per week    Comment: Drinks 1/5 day of wine   . Drug use: Yes    Types: "Crack" cocaine, Marijuana    Comment: Uses only once in awhile and does not look for it but has hard time when it is there tunrning it down.  Medication list has been reviewed and updated.  Current Meds  Medication Sig  . albuterol (PROVENTIL HFA;VENTOLIN HFA) 108 (90 Base) MCG/ACT inhaler INHALE 2 PUFFS BY MOUTH EVERY 4 HOURS AS NEEDED FOR WHEEZING/ SHORTNESS OF BREATH    . albuterol (PROVENTIL) (2.5 MG/3ML) 0.083% nebulizer solution Take 3 mLs (2.5 mg total) by nebulization every 6 (six) hours as needed for wheezing or shortness of breath.  . citalopram (CELEXA) 20 MG tablet Take 20 mg by mouth daily.  Marland Kitchen lisinopril (PRINIVIL,ZESTRIL) 40 MG tablet Take 1 tablet (40 mg total) by mouth daily at 2 PM.  . [DISCONTINUED] Multiple Vitamin (MULTIVITAMIN WITH MINERALS) TABS tablet Take 1 tablet by mouth daily.    PHQ 2/9 Scores 12/05/2017 09/13/2017 09/06/2017 01/30/2017  PHQ - 2 Score 0 1 2 6   PHQ- 9 Score 0 - 13 27    Physical Exam Vitals signs and nursing note reviewed.  HENT:     Head: Normocephalic.     Right Ear: Tympanic membrane, ear canal and external ear normal.     Left Ear: Tympanic membrane, ear canal and external ear normal.     Nose: Nose normal.  Eyes:     General: No scleral icterus.       Right eye: No discharge.        Left eye: No discharge.     Conjunctiva/sclera: Conjunctivae normal.     Pupils: Pupils are equal, round, and reactive to light.  Neck:     Musculoskeletal: Normal range of motion and neck supple. No neck rigidity or muscular tenderness.     Thyroid: No thyromegaly.     Vascular: No carotid bruit or JVD.     Trachea: No tracheal deviation.  Cardiovascular:     Rate and Rhythm: Normal rate and regular rhythm.     Pulses: Normal pulses.     Heart sounds: Normal heart sounds. No murmur. No friction rub. No gallop.   Pulmonary:     Effort: No respiratory distress.     Breath sounds: Normal breath sounds. No wheezing or rales.  Abdominal:     General: Bowel sounds are normal.     Palpations: Abdomen is soft. There is no mass.     Tenderness: There is no abdominal tenderness. There is no guarding or rebound.  Musculoskeletal: Normal range of motion.        General: No swelling or tenderness.  Lymphadenopathy:     Cervical: No cervical adenopathy.  Skin:    General: Skin is warm.     Findings: No rash.  Neurological:      Mental Status: He is alert and oriented to person, place, and time.     Cranial Nerves: No cranial nerve deficit.     Deep Tendon Reflexes: Reflexes are normal and symmetric.     Wt Readings from Last 3 Encounters:  05/15/18 170 lb (77.1 kg)  12/05/17 175 lb (79.4 kg)  09/06/17 186 lb (84.4 kg)    BP 132/90   Pulse 80   Ht 5\' 10"  (1.778 m)   Wt 170 lb (77.1 kg)   BMI 24.39 kg/m   Assessment and Plan:  1. Chronic diastolic heart failure (HCC) Patient has a history of chronic diastolic heart heart failure for which he was originally followed by Dr. Christella Noa patient's lisinopril 40 mg was refilled as well as his Coreg 6.25 mg 1 twice a day.  Ambulatory referral to cardiology with Dr. Christella Noa since he has not checked in with him  concerning his heart failure for reevaluation and if necessary change in treatment.  Incidentally patient on his own stopped his furosemide. - lisinopril (PRINIVIL,ZESTRIL) 40 MG tablet; Take 1 tablet (40 mg total) by mouth daily at 2 PM.  Dispense: 30 tablet; Refill: 6 - carvedilol (COREG) 6.25 MG tablet; Take 1 tablet (6.25 mg total) by mouth 2 (two) times daily with a meal.  Dispense: 60 tablet; Refill: 6 - Ambulatory referral to Cardiology  2. COPD exacerbation ([place]) Patient has documented COPD for which he is on albuterol inhaler 2 puffs every 4 hours as needed for wheezing and shortness of breath patient however continues to smoke both tobacco and marijuana. - albuterol (PROVENTIL HFA;VENTOLIN HFA) 108 (90 Base) MCG/ACT inhaler; INHALE 2 PUFFS BY MOUTH EVERY 4 HOURS AS NEEDED FOR WHEEZING/ SHORTNESS OF BREATH  Dispense: 90 g; Refill: 2 - albuterol (PROVENTIL) (2.5 MG/3ML) 0.083% nebulizer solution; Take 3 mLs (2.5 mg total) by nebulization every 6 (six) hours as needed for wheezing or shortness of breath.  Dispense: 75 mL; Refill: 12  3. Aortic atherosclerosis ([place]) Patient has a history of aortic atherosclerosis for which he is supposed to be taking  low-dose aspirin.  4. Chronic diastolic heart failure ([place]) As previously noted patient has chronic diastolic heart failure referral was made with cardiology for follow-up. - lisinopril (PRINIVIL,ZESTRIL) 40 MG tablet; Take 1 tablet (40 mg total) by mouth daily at 2 PM.  Dispense: 30 tablet; Refill: 6 - carvedilol (COREG) 6.25 MG tablet; Take 1 tablet (6.25 mg total) by mouth 2 (two) times daily with a meal.  Dispense: 60 tablet; Refill: 6 - Ambulatory referral to Cardiology  5. Essential (primary) hypertension Chronic.  Controlled.  Continue carvedilol 6.25 twice a day as well as lisinopril 40 mg once a day - carvedilol (COREG) 6.25 MG tablet; Take 1 tablet (6.25 mg total) by mouth 2 (two) times daily with a meal.  Dispense: 60 tablet; Refill: 6 - Renal Function Panel  6. Cigarette nicotine dependence without complication Patient has been advised of the health risks of smoking and counseled concerning cessation of tobacco products. I spent over 3 minutes for discussion and to answer questions.  7. Alcohol use Patient uses alcohol on a regular basis though he says he is cut down will refill both his multivitamin and folic acid because of suspected malnutrition due to his alcohol abuse. - Multiple Vitamin (MULTIVITAMIN WITH MINERALS) TABS tablet; Take 1 tablet by mouth daily.  Dispense: 30 tablet; Refill: 5  8. Noncompliance  Noncompliant with not taking his medications in particular on his stopping his furosemide patient was told about the importance of taking his medications as prescribed.  9. Current mild episode of major depressive disorder, unspecified whether recurrent ([place]) Patient has a history of depression which is chronic due to his ongoing medical problems Celexa 20 mg was refilled. - citalopram (CELEXA) 20 MG tablet; Take 1 tablet (20 mg total) by mouth daily.  Dispense: 30 tablet; Refill: 6  10. Need for home health care Patient approached today for a referral to home health  care.  Healthcare facility of choice for home [facility] was contacted who spoke to [place] who said only Medicaid patient is qualified for which patient is not eligible.  Patient was given the paperwork to submit for them to come to the home any valuate but it does not appear that he qualifies at this time.  We put in a call to Dr. box nurse concerning his rheumatoid arthritis and treatment for  which he may may have some qualifications but does not qualify for any of the treatments that I provide.  11. Moderate mixed hyperlipidemia not requiring statin therapy Patient has elevated cholesterol readings not currently on a statin we will do a lipid panel to evaluate his current lipid state. - Lipid panel  12. Meningioma ([place]) In [facility] and incidental finding noted a meningioma of the temporal lobe for which it is been suggested that a repeat MRI of the brain be done for evaluation and rule out progression of. - MR Brain W Wo Contrast; Future  13. Adrenal nodule ([place]) Patient was also underwent a CT of the abdomen pelvis for which there was an adrenal nodule noted 0.9 cm patient was referred for a CT as suggested of the abdomen and pelvis for evaluation of the adrenal nodule. - CT Abdomen Pelvis Wo Contrast; Future

## 2018-05-22 DIAGNOSIS — R0602 Shortness of breath: Secondary | ICD-10-CM | POA: Diagnosis not present

## 2018-05-22 DIAGNOSIS — R600 Localized edema: Secondary | ICD-10-CM | POA: Diagnosis not present

## 2018-05-22 DIAGNOSIS — R200 Anesthesia of skin: Secondary | ICD-10-CM | POA: Diagnosis not present

## 2018-05-22 DIAGNOSIS — R69 Illness, unspecified: Secondary | ICD-10-CM | POA: Diagnosis not present

## 2018-05-22 DIAGNOSIS — I10 Essential (primary) hypertension: Secondary | ICD-10-CM | POA: Diagnosis not present

## 2018-05-22 DIAGNOSIS — R202 Paresthesia of skin: Secondary | ICD-10-CM | POA: Diagnosis not present

## 2018-05-22 DIAGNOSIS — I5032 Chronic diastolic (congestive) heart failure: Secondary | ICD-10-CM | POA: Diagnosis not present

## 2018-05-24 ENCOUNTER — Telehealth: Payer: Self-pay

## 2018-05-24 NOTE — Telephone Encounter (Signed)
Received a call from [facility] concerning CT and MRI- insurance denied both. Nikki had sent ALL the info we had to support the reason, they still denied it. [id number] case # [id number]

## 2018-05-29 DIAGNOSIS — M0579 Rheumatoid arthritis with rheumatoid factor of multiple sites without organ or systems involvement: Secondary | ICD-10-CM | POA: Diagnosis not present

## 2018-05-29 DIAGNOSIS — R202 Paresthesia of skin: Secondary | ICD-10-CM | POA: Diagnosis not present

## 2018-05-29 DIAGNOSIS — R200 Anesthesia of skin: Secondary | ICD-10-CM | POA: Diagnosis not present

## 2018-05-29 DIAGNOSIS — Z79899 Other long term (current) drug therapy: Secondary | ICD-10-CM | POA: Diagnosis not present

## 2018-05-29 DIAGNOSIS — M4802 Spinal stenosis, cervical region: Secondary | ICD-10-CM | POA: Insufficient documentation

## 2018-05-29 DIAGNOSIS — M170 Bilateral primary osteoarthritis of knee: Secondary | ICD-10-CM | POA: Diagnosis not present

## 2018-05-29 DIAGNOSIS — M542 Cervicalgia: Secondary | ICD-10-CM | POA: Diagnosis not present

## 2018-05-30 DIAGNOSIS — M542 Cervicalgia: Secondary | ICD-10-CM | POA: Insufficient documentation

## 2018-06-27 DIAGNOSIS — I5032 Chronic diastolic (congestive) heart failure: Secondary | ICD-10-CM | POA: Diagnosis not present

## 2018-06-27 DIAGNOSIS — R0602 Shortness of breath: Secondary | ICD-10-CM | POA: Diagnosis not present

## 2018-06-27 DIAGNOSIS — I10 Essential (primary) hypertension: Secondary | ICD-10-CM | POA: Diagnosis not present

## 2018-07-05 DIAGNOSIS — I10 Essential (primary) hypertension: Secondary | ICD-10-CM | POA: Diagnosis not present

## 2018-07-05 DIAGNOSIS — R69 Illness, unspecified: Secondary | ICD-10-CM | POA: Diagnosis not present

## 2018-07-05 DIAGNOSIS — I5032 Chronic diastolic (congestive) heart failure: Secondary | ICD-10-CM | POA: Diagnosis not present

## 2018-07-05 DIAGNOSIS — Z720 Tobacco use: Secondary | ICD-10-CM | POA: Diagnosis not present

## 2018-07-18 DIAGNOSIS — G959 Disease of spinal cord, unspecified: Secondary | ICD-10-CM | POA: Diagnosis not present

## 2018-07-31 DIAGNOSIS — Z79899 Other long term (current) drug therapy: Secondary | ICD-10-CM | POA: Diagnosis not present

## 2018-08-01 DIAGNOSIS — M4712 Other spondylosis with myelopathy, cervical region: Secondary | ICD-10-CM | POA: Insufficient documentation

## 2018-08-08 DIAGNOSIS — Z419 Encounter for procedure for purposes other than remedying health state, unspecified: Secondary | ICD-10-CM | POA: Diagnosis not present

## 2018-08-08 DIAGNOSIS — G959 Disease of spinal cord, unspecified: Secondary | ICD-10-CM | POA: Diagnosis not present

## 2018-08-13 DIAGNOSIS — E119 Type 2 diabetes mellitus without complications: Secondary | ICD-10-CM | POA: Diagnosis not present

## 2018-08-13 DIAGNOSIS — I10 Essential (primary) hypertension: Secondary | ICD-10-CM | POA: Diagnosis not present

## 2018-08-13 DIAGNOSIS — I5032 Chronic diastolic (congestive) heart failure: Secondary | ICD-10-CM | POA: Diagnosis not present

## 2018-08-13 DIAGNOSIS — J449 Chronic obstructive pulmonary disease, unspecified: Secondary | ICD-10-CM | POA: Diagnosis not present

## 2018-08-13 DIAGNOSIS — M069 Rheumatoid arthritis, unspecified: Secondary | ICD-10-CM | POA: Diagnosis not present

## 2018-08-13 DIAGNOSIS — G4730 Sleep apnea, unspecified: Secondary | ICD-10-CM | POA: Diagnosis not present

## 2018-08-17 DIAGNOSIS — Z1159 Encounter for screening for other viral diseases: Secondary | ICD-10-CM | POA: Diagnosis not present

## 2018-08-20 DIAGNOSIS — R5381 Other malaise: Secondary | ICD-10-CM | POA: Diagnosis not present

## 2018-08-20 DIAGNOSIS — M150 Primary generalized (osteo)arthritis: Secondary | ICD-10-CM | POA: Diagnosis not present

## 2018-08-20 DIAGNOSIS — G4733 Obstructive sleep apnea (adult) (pediatric): Secondary | ICD-10-CM | POA: Diagnosis not present

## 2018-08-20 DIAGNOSIS — M5030 Other cervical disc degeneration, unspecified cervical region: Secondary | ICD-10-CM | POA: Diagnosis not present

## 2018-08-20 DIAGNOSIS — J449 Chronic obstructive pulmonary disease, unspecified: Secondary | ICD-10-CM | POA: Diagnosis not present

## 2018-08-20 DIAGNOSIS — I130 Hypertensive heart and chronic kidney disease with heart failure and stage 1 through stage 4 chronic kidney disease, or unspecified chronic kidney disease: Secondary | ICD-10-CM | POA: Diagnosis not present

## 2018-08-20 DIAGNOSIS — M4692 Unspecified inflammatory spondylopathy, cervical region: Secondary | ICD-10-CM | POA: Diagnosis not present

## 2018-08-20 DIAGNOSIS — M069 Rheumatoid arthritis, unspecified: Secondary | ICD-10-CM | POA: Diagnosis not present

## 2018-08-20 DIAGNOSIS — M4322 Fusion of spine, cervical region: Secondary | ICD-10-CM | POA: Diagnosis not present

## 2018-08-20 DIAGNOSIS — I509 Heart failure, unspecified: Secondary | ICD-10-CM | POA: Diagnosis not present

## 2018-08-20 DIAGNOSIS — R279 Unspecified lack of coordination: Secondary | ICD-10-CM | POA: Diagnosis not present

## 2018-08-20 DIAGNOSIS — I110 Hypertensive heart disease with heart failure: Secondary | ICD-10-CM | POA: Diagnosis not present

## 2018-08-20 DIAGNOSIS — M6281 Muscle weakness (generalized): Secondary | ICD-10-CM | POA: Diagnosis not present

## 2018-08-20 DIAGNOSIS — J441 Chronic obstructive pulmonary disease with (acute) exacerbation: Secondary | ICD-10-CM | POA: Diagnosis not present

## 2018-08-20 DIAGNOSIS — M059 Rheumatoid arthritis with rheumatoid factor, unspecified: Secondary | ICD-10-CM | POA: Diagnosis not present

## 2018-08-20 DIAGNOSIS — R9431 Abnormal electrocardiogram [ECG] [EKG]: Secondary | ICD-10-CM | POA: Diagnosis not present

## 2018-08-20 DIAGNOSIS — Z79899 Other long term (current) drug therapy: Secondary | ICD-10-CM | POA: Diagnosis not present

## 2018-08-20 DIAGNOSIS — Z48811 Encounter for surgical aftercare following surgery on the nervous system: Secondary | ICD-10-CM | POA: Diagnosis not present

## 2018-08-20 DIAGNOSIS — T797XXA Traumatic subcutaneous emphysema, initial encounter: Secondary | ICD-10-CM | POA: Diagnosis not present

## 2018-08-20 DIAGNOSIS — N189 Chronic kidney disease, unspecified: Secondary | ICD-10-CM | POA: Diagnosis not present

## 2018-08-20 DIAGNOSIS — Z1159 Encounter for screening for other viral diseases: Secondary | ICD-10-CM | POA: Diagnosis not present

## 2018-08-20 DIAGNOSIS — G959 Disease of spinal cord, unspecified: Secondary | ICD-10-CM | POA: Diagnosis not present

## 2018-08-20 DIAGNOSIS — E1122 Type 2 diabetes mellitus with diabetic chronic kidney disease: Secondary | ICD-10-CM | POA: Diagnosis not present

## 2018-08-20 DIAGNOSIS — M170 Bilateral primary osteoarthritis of knee: Secondary | ICD-10-CM | POA: Diagnosis not present

## 2018-08-20 DIAGNOSIS — R4701 Aphasia: Secondary | ICD-10-CM | POA: Diagnosis not present

## 2018-08-20 DIAGNOSIS — Z791 Long term (current) use of non-steroidal anti-inflammatories (NSAID): Secondary | ICD-10-CM | POA: Diagnosis not present

## 2018-08-20 DIAGNOSIS — I5032 Chronic diastolic (congestive) heart failure: Secondary | ICD-10-CM | POA: Diagnosis not present

## 2018-08-20 DIAGNOSIS — J4531 Mild persistent asthma with (acute) exacerbation: Secondary | ICD-10-CM | POA: Diagnosis not present

## 2018-08-20 DIAGNOSIS — E119 Type 2 diabetes mellitus without complications: Secondary | ICD-10-CM | POA: Diagnosis not present

## 2018-08-20 DIAGNOSIS — M0580 Other rheumatoid arthritis with rheumatoid factor of unspecified site: Secondary | ICD-10-CM | POA: Diagnosis not present

## 2018-08-20 DIAGNOSIS — M4712 Other spondylosis with myelopathy, cervical region: Secondary | ICD-10-CM | POA: Diagnosis not present

## 2018-08-20 DIAGNOSIS — Z981 Arthrodesis status: Secondary | ICD-10-CM | POA: Diagnosis not present

## 2018-08-21 DIAGNOSIS — R4701 Aphasia: Secondary | ICD-10-CM | POA: Diagnosis not present

## 2018-08-21 DIAGNOSIS — M5030 Other cervical disc degeneration, unspecified cervical region: Secondary | ICD-10-CM | POA: Diagnosis not present

## 2018-08-21 DIAGNOSIS — M4322 Fusion of spine, cervical region: Secondary | ICD-10-CM | POA: Diagnosis not present

## 2018-08-21 DIAGNOSIS — M069 Rheumatoid arthritis, unspecified: Secondary | ICD-10-CM | POA: Diagnosis not present

## 2018-08-21 DIAGNOSIS — T797XXA Traumatic subcutaneous emphysema, initial encounter: Secondary | ICD-10-CM | POA: Diagnosis not present

## 2018-08-21 DIAGNOSIS — M4712 Other spondylosis with myelopathy, cervical region: Secondary | ICD-10-CM | POA: Diagnosis not present

## 2018-08-21 DIAGNOSIS — M6281 Muscle weakness (generalized): Secondary | ICD-10-CM | POA: Diagnosis not present

## 2018-09-06 DIAGNOSIS — M170 Bilateral primary osteoarthritis of knee: Secondary | ICD-10-CM | POA: Diagnosis not present

## 2018-09-06 DIAGNOSIS — Z79899 Other long term (current) drug therapy: Secondary | ICD-10-CM | POA: Diagnosis not present

## 2018-09-06 DIAGNOSIS — M0580 Other rheumatoid arthritis with rheumatoid factor of unspecified site: Secondary | ICD-10-CM | POA: Diagnosis not present

## 2018-09-12 ENCOUNTER — Ambulatory Visit: Payer: Medicare HMO

## 2018-09-13 DIAGNOSIS — M170 Bilateral primary osteoarthritis of knee: Secondary | ICD-10-CM | POA: Diagnosis not present

## 2018-09-13 DIAGNOSIS — M069 Rheumatoid arthritis, unspecified: Secondary | ICD-10-CM | POA: Diagnosis not present

## 2018-09-14 DIAGNOSIS — M170 Bilateral primary osteoarthritis of knee: Secondary | ICD-10-CM | POA: Diagnosis not present

## 2018-09-17 DIAGNOSIS — I5032 Chronic diastolic (congestive) heart failure: Secondary | ICD-10-CM | POA: Diagnosis not present

## 2018-09-17 DIAGNOSIS — M4712 Other spondylosis with myelopathy, cervical region: Secondary | ICD-10-CM | POA: Diagnosis not present

## 2018-09-17 DIAGNOSIS — M6281 Muscle weakness (generalized): Secondary | ICD-10-CM | POA: Diagnosis not present

## 2018-09-17 DIAGNOSIS — M4322 Fusion of spine, cervical region: Secondary | ICD-10-CM | POA: Diagnosis not present

## 2018-09-17 DIAGNOSIS — M069 Rheumatoid arthritis, unspecified: Secondary | ICD-10-CM | POA: Diagnosis not present

## 2018-09-17 DIAGNOSIS — I110 Hypertensive heart disease with heart failure: Secondary | ICD-10-CM | POA: Diagnosis not present

## 2018-09-17 DIAGNOSIS — R4701 Aphasia: Secondary | ICD-10-CM | POA: Diagnosis not present

## 2018-09-18 DIAGNOSIS — M069 Rheumatoid arthritis, unspecified: Secondary | ICD-10-CM | POA: Diagnosis not present

## 2018-09-18 DIAGNOSIS — I110 Hypertensive heart disease with heart failure: Secondary | ICD-10-CM | POA: Diagnosis not present

## 2018-09-18 DIAGNOSIS — I5032 Chronic diastolic (congestive) heart failure: Secondary | ICD-10-CM | POA: Diagnosis not present

## 2018-09-18 DIAGNOSIS — M4712 Other spondylosis with myelopathy, cervical region: Secondary | ICD-10-CM | POA: Diagnosis not present

## 2018-09-20 ENCOUNTER — Telehealth: Payer: Self-pay | Admitting: Family Medicine

## 2018-09-20 DIAGNOSIS — R9431 Abnormal electrocardiogram [ECG] [EKG]: Secondary | ICD-10-CM | POA: Diagnosis not present

## 2018-09-20 DIAGNOSIS — I5032 Chronic diastolic (congestive) heart failure: Secondary | ICD-10-CM | POA: Diagnosis not present

## 2018-09-20 DIAGNOSIS — M4712 Other spondylosis with myelopathy, cervical region: Secondary | ICD-10-CM | POA: Diagnosis not present

## 2018-09-20 DIAGNOSIS — I110 Hypertensive heart disease with heart failure: Secondary | ICD-10-CM | POA: Diagnosis not present

## 2018-09-20 DIAGNOSIS — M069 Rheumatoid arthritis, unspecified: Secondary | ICD-10-CM | POA: Diagnosis not present

## 2018-09-20 DIAGNOSIS — G959 Disease of spinal cord, unspecified: Secondary | ICD-10-CM | POA: Diagnosis not present

## 2018-09-20 NOTE — Telephone Encounter (Signed)
Called to RE-schedule Medicare Annual Wellness Visit with Nurse Health Advisor.  ° °If patient returns call, please schedule AWV with NHA  °For any questions please contact:  °Kathryn Brown [phone] or skype at: [email or web address]  ° °

## 2018-09-25 DIAGNOSIS — M4712 Other spondylosis with myelopathy, cervical region: Secondary | ICD-10-CM | POA: Diagnosis not present

## 2018-09-25 DIAGNOSIS — J441 Chronic obstructive pulmonary disease with (acute) exacerbation: Secondary | ICD-10-CM | POA: Diagnosis not present

## 2018-09-26 DIAGNOSIS — R069 Unspecified abnormalities of breathing: Secondary | ICD-10-CM | POA: Diagnosis not present

## 2018-09-26 DIAGNOSIS — J441 Chronic obstructive pulmonary disease with (acute) exacerbation: Secondary | ICD-10-CM | POA: Diagnosis not present

## 2018-09-26 DIAGNOSIS — I110 Hypertensive heart disease with heart failure: Secondary | ICD-10-CM | POA: Diagnosis not present

## 2018-09-26 DIAGNOSIS — I2510 Atherosclerotic heart disease of native coronary artery without angina pectoris: Secondary | ICD-10-CM | POA: Diagnosis not present

## 2018-09-26 DIAGNOSIS — I509 Heart failure, unspecified: Secondary | ICD-10-CM | POA: Diagnosis not present

## 2018-09-26 DIAGNOSIS — M150 Primary generalized (osteo)arthritis: Secondary | ICD-10-CM | POA: Diagnosis not present

## 2018-09-26 DIAGNOSIS — J449 Chronic obstructive pulmonary disease, unspecified: Secondary | ICD-10-CM | POA: Diagnosis not present

## 2018-09-26 DIAGNOSIS — G959 Disease of spinal cord, unspecified: Secondary | ICD-10-CM | POA: Diagnosis not present

## 2018-09-26 DIAGNOSIS — R0602 Shortness of breath: Secondary | ICD-10-CM | POA: Diagnosis not present

## 2018-09-26 DIAGNOSIS — N189 Chronic kidney disease, unspecified: Secondary | ICD-10-CM | POA: Diagnosis not present

## 2018-09-26 DIAGNOSIS — M069 Rheumatoid arthritis, unspecified: Secondary | ICD-10-CM | POA: Diagnosis not present

## 2018-09-26 DIAGNOSIS — I5032 Chronic diastolic (congestive) heart failure: Secondary | ICD-10-CM | POA: Diagnosis not present

## 2018-09-26 DIAGNOSIS — R279 Unspecified lack of coordination: Secondary | ICD-10-CM | POA: Diagnosis not present

## 2018-09-26 DIAGNOSIS — F17210 Nicotine dependence, cigarettes, uncomplicated: Secondary | ICD-10-CM | POA: Diagnosis not present

## 2018-09-26 DIAGNOSIS — E119 Type 2 diabetes mellitus without complications: Secondary | ICD-10-CM | POA: Diagnosis not present

## 2018-09-26 DIAGNOSIS — M4692 Unspecified inflammatory spondylopathy, cervical region: Secondary | ICD-10-CM | POA: Diagnosis not present

## 2018-09-26 DIAGNOSIS — Z981 Arthrodesis status: Secondary | ICD-10-CM | POA: Diagnosis not present

## 2018-09-26 DIAGNOSIS — M4322 Fusion of spine, cervical region: Secondary | ICD-10-CM | POA: Diagnosis not present

## 2018-09-26 DIAGNOSIS — R062 Wheezing: Secondary | ICD-10-CM | POA: Diagnosis not present

## 2018-09-26 DIAGNOSIS — Z48811 Encounter for surgical aftercare following surgery on the nervous system: Secondary | ICD-10-CM | POA: Diagnosis not present

## 2018-09-26 DIAGNOSIS — R69 Illness, unspecified: Secondary | ICD-10-CM | POA: Diagnosis not present

## 2018-09-26 DIAGNOSIS — M4712 Other spondylosis with myelopathy, cervical region: Secondary | ICD-10-CM | POA: Diagnosis not present

## 2018-09-26 DIAGNOSIS — M6281 Muscle weakness (generalized): Secondary | ICD-10-CM | POA: Diagnosis not present

## 2018-09-26 DIAGNOSIS — G4733 Obstructive sleep apnea (adult) (pediatric): Secondary | ICD-10-CM | POA: Diagnosis not present

## 2018-09-26 DIAGNOSIS — J9811 Atelectasis: Secondary | ICD-10-CM | POA: Diagnosis not present

## 2018-09-26 DIAGNOSIS — Z79899 Other long term (current) drug therapy: Secondary | ICD-10-CM | POA: Diagnosis not present

## 2018-09-26 DIAGNOSIS — R5381 Other malaise: Secondary | ICD-10-CM | POA: Diagnosis not present

## 2018-09-28 DIAGNOSIS — G4733 Obstructive sleep apnea (adult) (pediatric): Secondary | ICD-10-CM | POA: Diagnosis not present

## 2018-09-28 DIAGNOSIS — J449 Chronic obstructive pulmonary disease, unspecified: Secondary | ICD-10-CM | POA: Diagnosis not present

## 2018-09-28 DIAGNOSIS — M069 Rheumatoid arthritis, unspecified: Secondary | ICD-10-CM | POA: Diagnosis not present

## 2018-09-28 DIAGNOSIS — N189 Chronic kidney disease, unspecified: Secondary | ICD-10-CM | POA: Diagnosis not present

## 2018-10-04 ENCOUNTER — Other Ambulatory Visit: Payer: Self-pay

## 2018-10-04 ENCOUNTER — Encounter (HOSPITAL_COMMUNITY): Payer: Self-pay

## 2018-10-04 ENCOUNTER — Emergency Department (HOSPITAL_COMMUNITY)
Admission: EM | Admit: 2018-10-04 | Discharge: 2018-10-04 | Disposition: A | Discharge status: Home / Self Care | Payer: Medicare HMO | Attending: Emergency Medicine | Admitting: Emergency Medicine

## 2018-10-04 ENCOUNTER — Emergency Department (HOSPITAL_COMMUNITY): Payer: Medicare HMO

## 2018-10-04 DIAGNOSIS — R69 Illness, unspecified: Secondary | ICD-10-CM | POA: Diagnosis not present

## 2018-10-04 DIAGNOSIS — J441 Chronic obstructive pulmonary disease with (acute) exacerbation: Secondary | ICD-10-CM | POA: Diagnosis not present

## 2018-10-04 DIAGNOSIS — J449 Chronic obstructive pulmonary disease, unspecified: Secondary | ICD-10-CM | POA: Diagnosis not present

## 2018-10-04 DIAGNOSIS — I5032 Chronic diastolic (congestive) heart failure: Secondary | ICD-10-CM | POA: Diagnosis not present

## 2018-10-04 DIAGNOSIS — E119 Type 2 diabetes mellitus without complications: Secondary | ICD-10-CM | POA: Diagnosis not present

## 2018-10-04 DIAGNOSIS — I2510 Atherosclerotic heart disease of native coronary artery without angina pectoris: Secondary | ICD-10-CM | POA: Diagnosis not present

## 2018-10-04 DIAGNOSIS — F17210 Nicotine dependence, cigarettes, uncomplicated: Secondary | ICD-10-CM | POA: Diagnosis not present

## 2018-10-04 DIAGNOSIS — J9811 Atelectasis: Secondary | ICD-10-CM | POA: Diagnosis not present

## 2018-10-04 DIAGNOSIS — I110 Hypertensive heart disease with heart failure: Secondary | ICD-10-CM | POA: Diagnosis not present

## 2018-10-04 DIAGNOSIS — R0602 Shortness of breath: Secondary | ICD-10-CM | POA: Diagnosis present

## 2018-10-04 DIAGNOSIS — G4733 Obstructive sleep apnea (adult) (pediatric): Secondary | ICD-10-CM | POA: Diagnosis not present

## 2018-10-04 DIAGNOSIS — Z79899 Other long term (current) drug therapy: Secondary | ICD-10-CM | POA: Diagnosis not present

## 2018-10-04 DIAGNOSIS — I509 Heart failure, unspecified: Secondary | ICD-10-CM | POA: Diagnosis not present

## 2018-10-04 LAB — BASIC METABOLIC PANEL
Anion gap: 7 (ref 5–15)
BUN: 19 mg/dL (ref 8–23)
CO2: 25 mmol/L (ref 22–32)
Calcium: 8.7 mg/dL — ABNORMAL LOW (ref 8.9–10.3)
Chloride: 103 mmol/L (ref 98–111)
Creatinine, Ser: 1.19 mg/dL (ref 0.61–1.24)
GFR calc Af Amer: >60 mL/min (ref >60)
GFR calc non Af Amer: >60 mL/min (ref >60)
Glucose, Bld: 110 mg/dL — ABNORMAL HIGH (ref 70–99)
Potassium: 4.2 mmol/L (ref 3.5–5.1)
Sodium: 135 mmol/L (ref 135–145)

## 2018-10-04 LAB — CBC
HCT: 41.3 % (ref 39.0–52.0)
Hemoglobin: 13 g/dL (ref 13.0–17.0)
MCH: 29.4 pg (ref 26.0–34.0)
MCHC: 31.5 g/dL (ref 30.0–36.0)
MCV: 93.4 fL (ref 80.0–100.0)
Platelets: 195 10*3/uL (ref 150–400)
RBC: 4.42 MIL/uL (ref 4.22–5.81)
RDW: 13.2 % (ref 11.5–15.5)
WBC: 7.4 10*3/uL (ref 4.0–10.5)
nRBC: 0 % (ref 0.0–0.2)

## 2018-10-04 MED ORDER — ACETAMINOPHEN 325 MG PO TABS
650.0000 mg | ORAL_TABLET | Freq: Once | ORAL | Status: AC
  Administered 2018-10-04: 650 mg via ORAL
  Filled 2018-10-04: qty 2

## 2018-10-04 NOTE — ED Triage Notes (Signed)
Pt arrives via [facility] from [facility] in [place].  Pt reports sob that started yesterday and worse today.  Pt received duo neb per ems and states improvement.  Pt denies cp.  Pt is at [facility] for rehab for mobility issues.

## 2018-10-04 NOTE — ED Provider Notes (Signed)
[facility] EMERGENCY DEPARTMENT Provider Note   CSN: [id number] Arrival date & time: 10/04/18  2020     History   Chief Complaint Chief Complaint  Patient presents with  . Shortness of Breath    HPI Daniel Frye is a 68 y.o. male.     Patient brought in by [facility].  From the brain center [place].  Patient reports shortness of breath started yesterday and worse today.  Patient was given DuoNeb by EMS and and and patient states that his breathing is improved significantly no more shortness of breath denies any chest pain fevers or upper respiratory symptoms.  EMS reported the patient the brain center for rehab for mobility issues.  Patient's past medical history is significant for CHF COPD substance abuse hypertension diabetes and alcohol abuse.  Patient is on an albuterol inhaler.  Patient does seem to have some memory confusion however.     Past Medical History:  Diagnosis Date  . Alcohol abuse   . CHF (congestive heart failure) ([place])   . Cocaine use   . COPD (chronic obstructive pulmonary disease) ([place])   . Depression   . Diabetes mellitus without complication ([place])   . Hypertension   . Marijuana use   . 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 Rheumatoid arthritis ([place])   . Substance abuse ([place])   . Tobacco use     Patient Active Problem List   Diagnosis Date Noted  . Aortic atherosclerosis ([place]) 05/15/2018  . Hypokalemia 03/03/2016  . Hypomagnesemia 03/03/2016  . Substance induced mood disorder ([place]) 03/02/2016  . Alcohol withdrawal ([place]) 02/29/2016  . Crack cocaine use 01/22/2016  . Obstructive sleep apnea of adult 03/30/2015  . SOB (shortness of breath) on exertion 03/30/2015  . Edema of foot 03/30/2015  . Chronic diastolic CHF (congestive heart failure), NYHA class 2 ([place]) 03/30/2015  . Alcohol abuse 03/23/2015  . Noncompliance 03/23/2015  . Vitamin D deficiency 01/27/2015  . Diabetes mellitus type 2,  controlled, without complications ([place]) 12/75/1700  . Obesity (BMI 30.0-34.9) 11/26/2014  . COPD (chronic obstructive pulmonary disease) ([place]) 06/22/2014  . Essential (primary) hypertension 06/22/2014  . Rheumatoid arthritis ([place]) 06/22/2014  . Tobacco abuse 06/22/2014  . Chronic obstructive pulmonary disease with acute exacerbation ([place]) 06/22/2014    Past Surgical History:  Procedure Laterality Date  . heart surgery to drain fluid N/A 1987   Heart surgery to remove fluid         Home Medications    Prior to Admission medications   Medication Sig Start Date End Date Taking? Authorizing Provider  albuterol (PROVENTIL HFA;VENTOLIN HFA) 108 (90 Base) MCG/ACT inhaler INHALE 2 PUFFS BY MOUTH EVERY 4 HOURS AS NEEDED FOR WHEEZING/ SHORTNESS OF BREATH 05/15/18  Yes Juline Patch, MD  albuterol (PROVENTIL) (2.5 MG/3ML) 0.083% nebulizer solution Take 3 mLs (2.5 mg total) by nebulization every 6 (six) hours as needed for wheezing or shortness of breath. 05/15/18  Yes Juline Patch, MD  carvedilol (COREG) 6.25 MG tablet Take 1 tablet (6.25 mg total) by mouth 2 (two) times daily with a meal. 05/15/18  Yes Juline Patch, MD  ibuprofen (ADVIL) 800 MG tablet Take 800 mg by mouth every 6 (six) hours as needed for mild pain or moderate pain.   Yes [provider]  oxycodone (OXY-IR) 5 MG capsule Take 5 mg by mouth every 4 (four) hours as needed for pain.   Yes [provider]  Family History Family History  Problem Relation Age of Onset  . COPD Brother   . Arthritis Brother   . Diabetes Brother   . Hypertension Brother     Social History Social History   Tobacco Use  . Smoking status: Current Every Day Smoker    Packs/day: 0.25    Years: 51.00    Pack years: 12.75    Types: Cigarettes, Cigars  . Smokeless tobacco: Never Used  Substance Use Topics  . Alcohol use: Yes    Alcohol/week: 61.0 standard drinks    Types: 12 Cans of beer, 49 Standard drinks or  equivalent per week    Comment: Drinks 1/5 day of wine   . Drug use: Yes    Types: "Crack" cocaine, Marijuana    Comment: Uses only once in awhile and does not look for it but has hard time when it is there tunrning it down.      Allergies   Patient has no known allergies.   Review of Systems Review of Systems  Constitutional: Negative for chills and fever.  HENT: Negative for congestion, rhinorrhea and sore throat.   Eyes: Negative for visual disturbance.  Respiratory: Positive for shortness of breath. Negative for cough.   Cardiovascular: Negative for chest pain and leg swelling.  Gastrointestinal: Negative for abdominal pain, diarrhea, nausea and vomiting.  Genitourinary: Negative for dysuria.  Musculoskeletal: Negative for back pain and neck pain.  Skin: Negative for rash.  Neurological: Negative for dizziness, light-headedness and headaches.  Hematological: Does not bruise/bleed easily.  Psychiatric/Behavioral: Positive for confusion.     Physical Exam Updated Vital Signs BP 114/73   Pulse (!) 51   Temp 97.8 F (36.6 C) (Oral)   Resp 18   Ht 1.778 m (5\' 10" )   Wt 76.7 kg   SpO2 99%   BMI 24.25 kg/m   Physical Exam Vitals signs and nursing note reviewed.  Constitutional:      Appearance: He is well-developed.  HENT:     Head: Normocephalic and atraumatic.  Eyes:     Extraocular Movements: Extraocular movements intact.     Conjunctiva/sclera: Conjunctivae normal.     Pupils: Pupils are equal, round, and reactive to light.  Neck:     Musculoskeletal: Normal range of motion and neck supple.  Cardiovascular:     Rate and Rhythm: Normal rate and regular rhythm.     Heart sounds: No murmur.  Pulmonary:     Effort: Pulmonary effort is normal. No respiratory distress.     Breath sounds: Normal breath sounds. No wheezing.  Abdominal:     Palpations: Abdomen is soft.     Tenderness: There is no abdominal tenderness.  Musculoskeletal: Normal range of motion.   Skin:    General: Skin is warm and dry.     Capillary Refill: Capillary refill takes less than 2 seconds.  Neurological:     General: No focal deficit present.     Mental Status: He is alert. Mental status is at baseline.      ED Treatments / Results  Labs (all labs ordered are listed, but only abnormal results are displayed) Labs Reviewed  BASIC METABOLIC PANEL - Abnormal; Notable for the following components:      Result Value   Glucose, Bld 110 (*)    Calcium 8.7 (*)    All other components within normal limits  CBC    EKG EKG Interpretation  Date/Time:  Thursday October 04 2018 20:28:34 EDT Ventricular Rate:  59 PR Interval:    QRS Duration: 91 QT Interval:  405 QTC Calculation: 402 R Axis:   -38 Text Interpretation:  Sinus rhythm Atrial premature complexes Left axis deviation Low voltage, extremity leads Confirmed by Fredia Sorrow [id number]) on 10/04/2018 9:05:05 PM   Radiology Dg Chest Portable 1 View  Result Date: 10/04/2018 CLINICAL DATA:  Shortness of breath EXAM: PORTABLE CHEST 1 VIEW COMPARISON:  02/29/2016 FINDINGS: Patchy right basilar atelectasis. No consolidation or effusion. Stable cardiomediastinal silhouette. No pneumothorax. IMPRESSION: No active disease.  Minimal patchy atelectasis at the right base Electronically Signed   By: Donavan Foil M.D.   On: 10/04/2018 21:13    Procedures Procedures (including critical care time)  Medications Ordered in ED Medications  acetaminophen (TYLENOL) tablet 650 mg (650 mg Oral Given 10/04/18 2246)     Initial Impression / Assessment and Plan / ED Course  I have reviewed the triage vital signs and the nursing notes.  Pertinent labs & imaging results that were available during my care of the patient were reviewed by me and considered in my medical decision making (see chart for details).       No leukocytosis.  Chest x-ray negative for any abnormal findings.  Patient states that all shortness of breath has  resolved.  It improved with a nebulizer.  Feel that this is an exacerbation of COPD.  Feel that patient does not require steroids.  Since he cleared so well by EMS nebulizer treatment.  We did call nursing facility to verify that he has an albuterol inhaler to use.  Recommend 2 puffs every 6 hours.   Final Clinical Impressions(s) / ED Diagnoses   Final diagnoses:  COPD exacerbation [facility])    ED Discharge Orders    None       Fredia Sorrow, MD 10/04/18 2317

## 2018-10-04 NOTE — Discharge Instructions (Addendum)
Use albuterol inhaler 2 puffs every 6 hours.  Chest x-ray here negative for any acute findings.  Patient with no further wheezing or shortness of breath.  Patient feels that the breathing is much better.

## 2018-10-08 DIAGNOSIS — J441 Chronic obstructive pulmonary disease with (acute) exacerbation: Secondary | ICD-10-CM | POA: Diagnosis not present

## 2018-10-10 DIAGNOSIS — I509 Heart failure, unspecified: Secondary | ICD-10-CM | POA: Diagnosis not present

## 2018-10-10 DIAGNOSIS — E119 Type 2 diabetes mellitus without complications: Secondary | ICD-10-CM | POA: Diagnosis not present

## 2018-10-10 DIAGNOSIS — N189 Chronic kidney disease, unspecified: Secondary | ICD-10-CM | POA: Diagnosis not present

## 2018-10-10 DIAGNOSIS — J449 Chronic obstructive pulmonary disease, unspecified: Secondary | ICD-10-CM | POA: Diagnosis not present

## 2018-10-14 DIAGNOSIS — R69 Illness, unspecified: Secondary | ICD-10-CM | POA: Diagnosis not present

## 2018-10-15 DIAGNOSIS — M4322 Fusion of spine, cervical region: Secondary | ICD-10-CM | POA: Diagnosis not present

## 2018-10-15 DIAGNOSIS — M4712 Other spondylosis with myelopathy, cervical region: Secondary | ICD-10-CM | POA: Diagnosis not present

## 2018-10-15 DIAGNOSIS — G959 Disease of spinal cord, unspecified: Secondary | ICD-10-CM | POA: Diagnosis not present

## 2018-10-15 DIAGNOSIS — Z48811 Encounter for surgical aftercare following surgery on the nervous system: Secondary | ICD-10-CM | POA: Diagnosis not present

## 2018-10-17 DIAGNOSIS — Z4789 Encounter for other orthopedic aftercare: Secondary | ICD-10-CM | POA: Diagnosis not present

## 2018-10-17 DIAGNOSIS — Z981 Arthrodesis status: Secondary | ICD-10-CM | POA: Diagnosis not present

## 2018-10-17 DIAGNOSIS — G4733 Obstructive sleep apnea (adult) (pediatric): Secondary | ICD-10-CM | POA: Diagnosis not present

## 2018-10-17 DIAGNOSIS — N189 Chronic kidney disease, unspecified: Secondary | ICD-10-CM | POA: Diagnosis not present

## 2018-10-17 DIAGNOSIS — M069 Rheumatoid arthritis, unspecified: Secondary | ICD-10-CM | POA: Diagnosis not present

## 2018-10-17 DIAGNOSIS — E1122 Type 2 diabetes mellitus with diabetic chronic kidney disease: Secondary | ICD-10-CM | POA: Diagnosis not present

## 2018-10-17 DIAGNOSIS — J449 Chronic obstructive pulmonary disease, unspecified: Secondary | ICD-10-CM | POA: Diagnosis not present

## 2018-10-17 DIAGNOSIS — I509 Heart failure, unspecified: Secondary | ICD-10-CM | POA: Diagnosis not present

## 2018-10-17 DIAGNOSIS — M4712 Other spondylosis with myelopathy, cervical region: Secondary | ICD-10-CM | POA: Diagnosis not present

## 2018-10-17 DIAGNOSIS — I130 Hypertensive heart and chronic kidney disease with heart failure and stage 1 through stage 4 chronic kidney disease, or unspecified chronic kidney disease: Secondary | ICD-10-CM | POA: Diagnosis not present

## 2018-10-22 DIAGNOSIS — Z4789 Encounter for other orthopedic aftercare: Secondary | ICD-10-CM | POA: Diagnosis not present

## 2018-10-22 DIAGNOSIS — M069 Rheumatoid arthritis, unspecified: Secondary | ICD-10-CM | POA: Diagnosis not present

## 2018-10-22 DIAGNOSIS — E1122 Type 2 diabetes mellitus with diabetic chronic kidney disease: Secondary | ICD-10-CM | POA: Diagnosis not present

## 2018-10-22 DIAGNOSIS — M4712 Other spondylosis with myelopathy, cervical region: Secondary | ICD-10-CM | POA: Diagnosis not present

## 2018-10-22 DIAGNOSIS — G4733 Obstructive sleep apnea (adult) (pediatric): Secondary | ICD-10-CM | POA: Diagnosis not present

## 2018-10-22 DIAGNOSIS — I509 Heart failure, unspecified: Secondary | ICD-10-CM | POA: Diagnosis not present

## 2018-10-22 DIAGNOSIS — Z981 Arthrodesis status: Secondary | ICD-10-CM | POA: Diagnosis not present

## 2018-10-22 DIAGNOSIS — J449 Chronic obstructive pulmonary disease, unspecified: Secondary | ICD-10-CM | POA: Diagnosis not present

## 2018-10-22 DIAGNOSIS — I130 Hypertensive heart and chronic kidney disease with heart failure and stage 1 through stage 4 chronic kidney disease, or unspecified chronic kidney disease: Secondary | ICD-10-CM | POA: Diagnosis not present

## 2018-10-22 DIAGNOSIS — N189 Chronic kidney disease, unspecified: Secondary | ICD-10-CM | POA: Diagnosis not present

## 2018-10-23 DIAGNOSIS — J449 Chronic obstructive pulmonary disease, unspecified: Secondary | ICD-10-CM | POA: Diagnosis not present

## 2018-10-23 DIAGNOSIS — M069 Rheumatoid arthritis, unspecified: Secondary | ICD-10-CM | POA: Diagnosis not present

## 2018-10-23 DIAGNOSIS — M4712 Other spondylosis with myelopathy, cervical region: Secondary | ICD-10-CM | POA: Diagnosis not present

## 2018-10-23 DIAGNOSIS — Z981 Arthrodesis status: Secondary | ICD-10-CM | POA: Diagnosis not present

## 2018-10-23 DIAGNOSIS — Z4789 Encounter for other orthopedic aftercare: Secondary | ICD-10-CM | POA: Diagnosis not present

## 2018-10-23 DIAGNOSIS — E1122 Type 2 diabetes mellitus with diabetic chronic kidney disease: Secondary | ICD-10-CM | POA: Diagnosis not present

## 2018-10-23 DIAGNOSIS — N189 Chronic kidney disease, unspecified: Secondary | ICD-10-CM | POA: Diagnosis not present

## 2018-10-23 DIAGNOSIS — I130 Hypertensive heart and chronic kidney disease with heart failure and stage 1 through stage 4 chronic kidney disease, or unspecified chronic kidney disease: Secondary | ICD-10-CM | POA: Diagnosis not present

## 2018-10-23 DIAGNOSIS — I509 Heart failure, unspecified: Secondary | ICD-10-CM | POA: Diagnosis not present

## 2018-10-23 DIAGNOSIS — G4733 Obstructive sleep apnea (adult) (pediatric): Secondary | ICD-10-CM | POA: Diagnosis not present

## 2018-10-25 DIAGNOSIS — I130 Hypertensive heart and chronic kidney disease with heart failure and stage 1 through stage 4 chronic kidney disease, or unspecified chronic kidney disease: Secondary | ICD-10-CM | POA: Diagnosis not present

## 2018-10-25 DIAGNOSIS — M069 Rheumatoid arthritis, unspecified: Secondary | ICD-10-CM | POA: Diagnosis not present

## 2018-10-25 DIAGNOSIS — M4712 Other spondylosis with myelopathy, cervical region: Secondary | ICD-10-CM | POA: Diagnosis not present

## 2018-10-25 DIAGNOSIS — E1122 Type 2 diabetes mellitus with diabetic chronic kidney disease: Secondary | ICD-10-CM | POA: Diagnosis not present

## 2018-10-25 DIAGNOSIS — Z4789 Encounter for other orthopedic aftercare: Secondary | ICD-10-CM | POA: Diagnosis not present

## 2018-10-25 DIAGNOSIS — J449 Chronic obstructive pulmonary disease, unspecified: Secondary | ICD-10-CM | POA: Diagnosis not present

## 2018-10-25 DIAGNOSIS — I509 Heart failure, unspecified: Secondary | ICD-10-CM | POA: Diagnosis not present

## 2018-10-25 DIAGNOSIS — G4733 Obstructive sleep apnea (adult) (pediatric): Secondary | ICD-10-CM | POA: Diagnosis not present

## 2018-10-25 DIAGNOSIS — N189 Chronic kidney disease, unspecified: Secondary | ICD-10-CM | POA: Diagnosis not present

## 2018-10-25 DIAGNOSIS — Z981 Arthrodesis status: Secondary | ICD-10-CM | POA: Diagnosis not present

## 2018-10-29 DIAGNOSIS — G4733 Obstructive sleep apnea (adult) (pediatric): Secondary | ICD-10-CM | POA: Diagnosis not present

## 2018-10-29 DIAGNOSIS — J449 Chronic obstructive pulmonary disease, unspecified: Secondary | ICD-10-CM | POA: Diagnosis not present

## 2018-10-29 DIAGNOSIS — Z981 Arthrodesis status: Secondary | ICD-10-CM | POA: Diagnosis not present

## 2018-10-29 DIAGNOSIS — E1122 Type 2 diabetes mellitus with diabetic chronic kidney disease: Secondary | ICD-10-CM | POA: Diagnosis not present

## 2018-10-29 DIAGNOSIS — I509 Heart failure, unspecified: Secondary | ICD-10-CM | POA: Diagnosis not present

## 2018-10-29 DIAGNOSIS — M4712 Other spondylosis with myelopathy, cervical region: Secondary | ICD-10-CM | POA: Diagnosis not present

## 2018-10-29 DIAGNOSIS — I130 Hypertensive heart and chronic kidney disease with heart failure and stage 1 through stage 4 chronic kidney disease, or unspecified chronic kidney disease: Secondary | ICD-10-CM | POA: Diagnosis not present

## 2018-10-29 DIAGNOSIS — M069 Rheumatoid arthritis, unspecified: Secondary | ICD-10-CM | POA: Diagnosis not present

## 2018-10-29 DIAGNOSIS — Z4789 Encounter for other orthopedic aftercare: Secondary | ICD-10-CM | POA: Diagnosis not present

## 2018-10-29 DIAGNOSIS — N189 Chronic kidney disease, unspecified: Secondary | ICD-10-CM | POA: Diagnosis not present

## 2018-10-30 DIAGNOSIS — R0602 Shortness of breath: Secondary | ICD-10-CM | POA: Diagnosis not present

## 2018-10-30 DIAGNOSIS — I10 Essential (primary) hypertension: Secondary | ICD-10-CM | POA: Diagnosis not present

## 2018-10-30 DIAGNOSIS — R69 Illness, unspecified: Secondary | ICD-10-CM | POA: Diagnosis not present

## 2018-10-30 DIAGNOSIS — Z720 Tobacco use: Secondary | ICD-10-CM | POA: Diagnosis not present

## 2018-10-30 DIAGNOSIS — R600 Localized edema: Secondary | ICD-10-CM | POA: Diagnosis not present

## 2018-10-30 DIAGNOSIS — G4733 Obstructive sleep apnea (adult) (pediatric): Secondary | ICD-10-CM | POA: Diagnosis not present

## 2018-10-30 DIAGNOSIS — I5032 Chronic diastolic (congestive) heart failure: Secondary | ICD-10-CM | POA: Diagnosis not present

## 2018-10-31 DIAGNOSIS — I130 Hypertensive heart and chronic kidney disease with heart failure and stage 1 through stage 4 chronic kidney disease, or unspecified chronic kidney disease: Secondary | ICD-10-CM | POA: Diagnosis not present

## 2018-10-31 DIAGNOSIS — Z4789 Encounter for other orthopedic aftercare: Secondary | ICD-10-CM | POA: Diagnosis not present

## 2018-10-31 DIAGNOSIS — Z981 Arthrodesis status: Secondary | ICD-10-CM | POA: Diagnosis not present

## 2018-10-31 DIAGNOSIS — E1122 Type 2 diabetes mellitus with diabetic chronic kidney disease: Secondary | ICD-10-CM | POA: Diagnosis not present

## 2018-10-31 DIAGNOSIS — N189 Chronic kidney disease, unspecified: Secondary | ICD-10-CM | POA: Diagnosis not present

## 2018-10-31 DIAGNOSIS — J449 Chronic obstructive pulmonary disease, unspecified: Secondary | ICD-10-CM | POA: Diagnosis not present

## 2018-10-31 DIAGNOSIS — I509 Heart failure, unspecified: Secondary | ICD-10-CM | POA: Diagnosis not present

## 2018-10-31 DIAGNOSIS — G4733 Obstructive sleep apnea (adult) (pediatric): Secondary | ICD-10-CM | POA: Diagnosis not present

## 2018-10-31 DIAGNOSIS — M4712 Other spondylosis with myelopathy, cervical region: Secondary | ICD-10-CM | POA: Diagnosis not present

## 2018-10-31 DIAGNOSIS — M069 Rheumatoid arthritis, unspecified: Secondary | ICD-10-CM | POA: Diagnosis not present

## 2018-11-05 DIAGNOSIS — M4712 Other spondylosis with myelopathy, cervical region: Secondary | ICD-10-CM | POA: Diagnosis not present

## 2018-11-05 DIAGNOSIS — Z981 Arthrodesis status: Secondary | ICD-10-CM | POA: Diagnosis not present

## 2018-11-05 DIAGNOSIS — G4733 Obstructive sleep apnea (adult) (pediatric): Secondary | ICD-10-CM | POA: Diagnosis not present

## 2018-11-05 DIAGNOSIS — Z4789 Encounter for other orthopedic aftercare: Secondary | ICD-10-CM | POA: Diagnosis not present

## 2018-11-05 DIAGNOSIS — I509 Heart failure, unspecified: Secondary | ICD-10-CM | POA: Diagnosis not present

## 2018-11-05 DIAGNOSIS — N189 Chronic kidney disease, unspecified: Secondary | ICD-10-CM | POA: Diagnosis not present

## 2018-11-05 DIAGNOSIS — I130 Hypertensive heart and chronic kidney disease with heart failure and stage 1 through stage 4 chronic kidney disease, or unspecified chronic kidney disease: Secondary | ICD-10-CM | POA: Diagnosis not present

## 2018-11-05 DIAGNOSIS — J449 Chronic obstructive pulmonary disease, unspecified: Secondary | ICD-10-CM | POA: Diagnosis not present

## 2018-11-05 DIAGNOSIS — M069 Rheumatoid arthritis, unspecified: Secondary | ICD-10-CM | POA: Diagnosis not present

## 2018-11-05 DIAGNOSIS — E1122 Type 2 diabetes mellitus with diabetic chronic kidney disease: Secondary | ICD-10-CM | POA: Diagnosis not present

## 2018-11-06 DIAGNOSIS — M4712 Other spondylosis with myelopathy, cervical region: Secondary | ICD-10-CM | POA: Diagnosis not present

## 2018-11-06 DIAGNOSIS — E1122 Type 2 diabetes mellitus with diabetic chronic kidney disease: Secondary | ICD-10-CM | POA: Diagnosis not present

## 2018-11-06 DIAGNOSIS — I130 Hypertensive heart and chronic kidney disease with heart failure and stage 1 through stage 4 chronic kidney disease, or unspecified chronic kidney disease: Secondary | ICD-10-CM | POA: Diagnosis not present

## 2018-11-06 DIAGNOSIS — G4733 Obstructive sleep apnea (adult) (pediatric): Secondary | ICD-10-CM | POA: Diagnosis not present

## 2018-11-06 DIAGNOSIS — Z4789 Encounter for other orthopedic aftercare: Secondary | ICD-10-CM | POA: Diagnosis not present

## 2018-11-06 DIAGNOSIS — M069 Rheumatoid arthritis, unspecified: Secondary | ICD-10-CM | POA: Diagnosis not present

## 2018-11-06 DIAGNOSIS — J449 Chronic obstructive pulmonary disease, unspecified: Secondary | ICD-10-CM | POA: Diagnosis not present

## 2018-11-06 DIAGNOSIS — I509 Heart failure, unspecified: Secondary | ICD-10-CM | POA: Diagnosis not present

## 2018-11-06 DIAGNOSIS — N189 Chronic kidney disease, unspecified: Secondary | ICD-10-CM | POA: Diagnosis not present

## 2018-11-06 DIAGNOSIS — Z981 Arthrodesis status: Secondary | ICD-10-CM | POA: Diagnosis not present

## 2018-11-12 DIAGNOSIS — E1122 Type 2 diabetes mellitus with diabetic chronic kidney disease: Secondary | ICD-10-CM | POA: Diagnosis not present

## 2018-11-12 DIAGNOSIS — I130 Hypertensive heart and chronic kidney disease with heart failure and stage 1 through stage 4 chronic kidney disease, or unspecified chronic kidney disease: Secondary | ICD-10-CM | POA: Diagnosis not present

## 2018-11-12 DIAGNOSIS — I509 Heart failure, unspecified: Secondary | ICD-10-CM | POA: Diagnosis not present

## 2018-11-12 DIAGNOSIS — J449 Chronic obstructive pulmonary disease, unspecified: Secondary | ICD-10-CM | POA: Diagnosis not present

## 2018-11-12 DIAGNOSIS — N189 Chronic kidney disease, unspecified: Secondary | ICD-10-CM | POA: Diagnosis not present

## 2018-11-12 DIAGNOSIS — Z4789 Encounter for other orthopedic aftercare: Secondary | ICD-10-CM | POA: Diagnosis not present

## 2018-11-12 DIAGNOSIS — M069 Rheumatoid arthritis, unspecified: Secondary | ICD-10-CM | POA: Diagnosis not present

## 2018-11-12 DIAGNOSIS — Z981 Arthrodesis status: Secondary | ICD-10-CM | POA: Diagnosis not present

## 2018-11-12 DIAGNOSIS — M4712 Other spondylosis with myelopathy, cervical region: Secondary | ICD-10-CM | POA: Diagnosis not present

## 2018-11-12 DIAGNOSIS — G4733 Obstructive sleep apnea (adult) (pediatric): Secondary | ICD-10-CM | POA: Diagnosis not present

## 2018-11-14 DIAGNOSIS — M069 Rheumatoid arthritis, unspecified: Secondary | ICD-10-CM | POA: Diagnosis not present

## 2018-11-14 DIAGNOSIS — Z981 Arthrodesis status: Secondary | ICD-10-CM | POA: Diagnosis not present

## 2018-11-14 DIAGNOSIS — I509 Heart failure, unspecified: Secondary | ICD-10-CM | POA: Diagnosis not present

## 2018-11-14 DIAGNOSIS — G4733 Obstructive sleep apnea (adult) (pediatric): Secondary | ICD-10-CM | POA: Diagnosis not present

## 2018-11-14 DIAGNOSIS — Z4789 Encounter for other orthopedic aftercare: Secondary | ICD-10-CM | POA: Diagnosis not present

## 2018-11-14 DIAGNOSIS — I130 Hypertensive heart and chronic kidney disease with heart failure and stage 1 through stage 4 chronic kidney disease, or unspecified chronic kidney disease: Secondary | ICD-10-CM | POA: Diagnosis not present

## 2018-11-14 DIAGNOSIS — J449 Chronic obstructive pulmonary disease, unspecified: Secondary | ICD-10-CM | POA: Diagnosis not present

## 2018-11-14 DIAGNOSIS — N189 Chronic kidney disease, unspecified: Secondary | ICD-10-CM | POA: Diagnosis not present

## 2018-11-14 DIAGNOSIS — M4712 Other spondylosis with myelopathy, cervical region: Secondary | ICD-10-CM | POA: Diagnosis not present

## 2018-11-14 DIAGNOSIS — E1122 Type 2 diabetes mellitus with diabetic chronic kidney disease: Secondary | ICD-10-CM | POA: Diagnosis not present

## 2018-11-15 DIAGNOSIS — M4322 Fusion of spine, cervical region: Secondary | ICD-10-CM | POA: Diagnosis not present

## 2018-11-15 DIAGNOSIS — Z48811 Encounter for surgical aftercare following surgery on the nervous system: Secondary | ICD-10-CM | POA: Diagnosis not present

## 2018-11-15 DIAGNOSIS — G959 Disease of spinal cord, unspecified: Secondary | ICD-10-CM | POA: Diagnosis not present

## 2018-11-15 DIAGNOSIS — M4712 Other spondylosis with myelopathy, cervical region: Secondary | ICD-10-CM | POA: Diagnosis not present

## 2018-11-16 DIAGNOSIS — E1122 Type 2 diabetes mellitus with diabetic chronic kidney disease: Secondary | ICD-10-CM | POA: Diagnosis not present

## 2018-11-16 DIAGNOSIS — M069 Rheumatoid arthritis, unspecified: Secondary | ICD-10-CM | POA: Diagnosis not present

## 2018-11-16 DIAGNOSIS — M4712 Other spondylosis with myelopathy, cervical region: Secondary | ICD-10-CM | POA: Diagnosis not present

## 2018-11-16 DIAGNOSIS — I130 Hypertensive heart and chronic kidney disease with heart failure and stage 1 through stage 4 chronic kidney disease, or unspecified chronic kidney disease: Secondary | ICD-10-CM | POA: Diagnosis not present

## 2018-11-16 DIAGNOSIS — Z4789 Encounter for other orthopedic aftercare: Secondary | ICD-10-CM | POA: Diagnosis not present

## 2018-11-16 DIAGNOSIS — N189 Chronic kidney disease, unspecified: Secondary | ICD-10-CM | POA: Diagnosis not present

## 2018-11-16 DIAGNOSIS — I509 Heart failure, unspecified: Secondary | ICD-10-CM | POA: Diagnosis not present

## 2018-11-16 DIAGNOSIS — G4733 Obstructive sleep apnea (adult) (pediatric): Secondary | ICD-10-CM | POA: Diagnosis not present

## 2018-11-16 DIAGNOSIS — J449 Chronic obstructive pulmonary disease, unspecified: Secondary | ICD-10-CM | POA: Diagnosis not present

## 2018-11-16 DIAGNOSIS — Z981 Arthrodesis status: Secondary | ICD-10-CM | POA: Diagnosis not present

## 2018-11-19 DIAGNOSIS — G4733 Obstructive sleep apnea (adult) (pediatric): Secondary | ICD-10-CM | POA: Diagnosis not present

## 2018-11-19 DIAGNOSIS — M4712 Other spondylosis with myelopathy, cervical region: Secondary | ICD-10-CM | POA: Diagnosis not present

## 2018-11-19 DIAGNOSIS — Z981 Arthrodesis status: Secondary | ICD-10-CM | POA: Diagnosis not present

## 2018-11-19 DIAGNOSIS — E1122 Type 2 diabetes mellitus with diabetic chronic kidney disease: Secondary | ICD-10-CM | POA: Diagnosis not present

## 2018-11-19 DIAGNOSIS — J449 Chronic obstructive pulmonary disease, unspecified: Secondary | ICD-10-CM | POA: Diagnosis not present

## 2018-11-19 DIAGNOSIS — Z4789 Encounter for other orthopedic aftercare: Secondary | ICD-10-CM | POA: Diagnosis not present

## 2018-11-19 DIAGNOSIS — M069 Rheumatoid arthritis, unspecified: Secondary | ICD-10-CM | POA: Diagnosis not present

## 2018-11-19 DIAGNOSIS — I509 Heart failure, unspecified: Secondary | ICD-10-CM | POA: Diagnosis not present

## 2018-11-19 DIAGNOSIS — I130 Hypertensive heart and chronic kidney disease with heart failure and stage 1 through stage 4 chronic kidney disease, or unspecified chronic kidney disease: Secondary | ICD-10-CM | POA: Diagnosis not present

## 2018-11-19 DIAGNOSIS — N189 Chronic kidney disease, unspecified: Secondary | ICD-10-CM | POA: Diagnosis not present

## 2018-11-22 DIAGNOSIS — J449 Chronic obstructive pulmonary disease, unspecified: Secondary | ICD-10-CM | POA: Diagnosis not present

## 2018-11-22 DIAGNOSIS — I130 Hypertensive heart and chronic kidney disease with heart failure and stage 1 through stage 4 chronic kidney disease, or unspecified chronic kidney disease: Secondary | ICD-10-CM | POA: Diagnosis not present

## 2018-11-22 DIAGNOSIS — N189 Chronic kidney disease, unspecified: Secondary | ICD-10-CM | POA: Diagnosis not present

## 2018-11-22 DIAGNOSIS — E1122 Type 2 diabetes mellitus with diabetic chronic kidney disease: Secondary | ICD-10-CM | POA: Diagnosis not present

## 2018-11-22 DIAGNOSIS — G4733 Obstructive sleep apnea (adult) (pediatric): Secondary | ICD-10-CM | POA: Diagnosis not present

## 2018-11-22 DIAGNOSIS — M4712 Other spondylosis with myelopathy, cervical region: Secondary | ICD-10-CM | POA: Diagnosis not present

## 2018-11-22 DIAGNOSIS — Z4789 Encounter for other orthopedic aftercare: Secondary | ICD-10-CM | POA: Diagnosis not present

## 2018-11-22 DIAGNOSIS — I509 Heart failure, unspecified: Secondary | ICD-10-CM | POA: Diagnosis not present

## 2018-11-22 DIAGNOSIS — M069 Rheumatoid arthritis, unspecified: Secondary | ICD-10-CM | POA: Diagnosis not present

## 2018-11-22 DIAGNOSIS — Z981 Arthrodesis status: Secondary | ICD-10-CM | POA: Diagnosis not present

## 2018-11-26 DIAGNOSIS — I509 Heart failure, unspecified: Secondary | ICD-10-CM | POA: Diagnosis not present

## 2018-11-26 DIAGNOSIS — E1122 Type 2 diabetes mellitus with diabetic chronic kidney disease: Secondary | ICD-10-CM | POA: Diagnosis not present

## 2018-11-26 DIAGNOSIS — N189 Chronic kidney disease, unspecified: Secondary | ICD-10-CM | POA: Diagnosis not present

## 2018-11-26 DIAGNOSIS — I130 Hypertensive heart and chronic kidney disease with heart failure and stage 1 through stage 4 chronic kidney disease, or unspecified chronic kidney disease: Secondary | ICD-10-CM | POA: Diagnosis not present

## 2018-11-26 DIAGNOSIS — Z981 Arthrodesis status: Secondary | ICD-10-CM | POA: Diagnosis not present

## 2018-11-26 DIAGNOSIS — Z4789 Encounter for other orthopedic aftercare: Secondary | ICD-10-CM | POA: Diagnosis not present

## 2018-11-26 DIAGNOSIS — J449 Chronic obstructive pulmonary disease, unspecified: Secondary | ICD-10-CM | POA: Diagnosis not present

## 2018-11-26 DIAGNOSIS — M4712 Other spondylosis with myelopathy, cervical region: Secondary | ICD-10-CM | POA: Diagnosis not present

## 2018-11-26 DIAGNOSIS — M069 Rheumatoid arthritis, unspecified: Secondary | ICD-10-CM | POA: Diagnosis not present

## 2018-11-26 DIAGNOSIS — G4733 Obstructive sleep apnea (adult) (pediatric): Secondary | ICD-10-CM | POA: Diagnosis not present

## 2018-11-30 DIAGNOSIS — M4712 Other spondylosis with myelopathy, cervical region: Secondary | ICD-10-CM | POA: Diagnosis not present

## 2018-11-30 DIAGNOSIS — I509 Heart failure, unspecified: Secondary | ICD-10-CM | POA: Diagnosis not present

## 2018-11-30 DIAGNOSIS — M069 Rheumatoid arthritis, unspecified: Secondary | ICD-10-CM | POA: Diagnosis not present

## 2018-11-30 DIAGNOSIS — E1122 Type 2 diabetes mellitus with diabetic chronic kidney disease: Secondary | ICD-10-CM | POA: Diagnosis not present

## 2018-11-30 DIAGNOSIS — I130 Hypertensive heart and chronic kidney disease with heart failure and stage 1 through stage 4 chronic kidney disease, or unspecified chronic kidney disease: Secondary | ICD-10-CM | POA: Diagnosis not present

## 2018-11-30 DIAGNOSIS — G4733 Obstructive sleep apnea (adult) (pediatric): Secondary | ICD-10-CM | POA: Diagnosis not present

## 2018-11-30 DIAGNOSIS — Z981 Arthrodesis status: Secondary | ICD-10-CM | POA: Diagnosis not present

## 2018-11-30 DIAGNOSIS — Z4789 Encounter for other orthopedic aftercare: Secondary | ICD-10-CM | POA: Diagnosis not present

## 2018-11-30 DIAGNOSIS — N189 Chronic kidney disease, unspecified: Secondary | ICD-10-CM | POA: Diagnosis not present

## 2018-11-30 DIAGNOSIS — J449 Chronic obstructive pulmonary disease, unspecified: Secondary | ICD-10-CM | POA: Diagnosis not present

## 2019-01-07 DIAGNOSIS — M5136 Other intervertebral disc degeneration, lumbar region: Secondary | ICD-10-CM | POA: Insufficient documentation

## 2019-02-16 ENCOUNTER — Other Ambulatory Visit: Payer: Self-pay | Admitting: Family Medicine

## 2019-02-16 DIAGNOSIS — I5032 Chronic diastolic (congestive) heart failure: Secondary | ICD-10-CM

## 2019-06-19 ENCOUNTER — Ambulatory Visit: Payer: Medicare Other

## 2019-07-30 ENCOUNTER — Encounter: Payer: Self-pay | Admitting: Emergency Medicine

## 2019-07-30 ENCOUNTER — Emergency Department: Payer: Medicare Other

## 2019-07-30 ENCOUNTER — Observation Stay
Admission: EM | Admit: 2019-07-30 | Discharge: 2019-07-31 | Disposition: A | Discharge status: Home / Self Care | Payer: Medicare Other | Attending: Internal Medicine | Admitting: Internal Medicine

## 2019-07-30 ENCOUNTER — Other Ambulatory Visit: Payer: Self-pay

## 2019-07-30 DIAGNOSIS — J441 Chronic obstructive pulmonary disease with (acute) exacerbation: Secondary | ICD-10-CM | POA: Diagnosis not present

## 2019-07-30 DIAGNOSIS — I10 Essential (primary) hypertension: Secondary | ICD-10-CM | POA: Diagnosis present

## 2019-07-30 DIAGNOSIS — Y848 Other medical procedures as the cause of abnormal reaction of the patient, or of later complication, without mention of misadventure at the time of the procedure: Secondary | ICD-10-CM | POA: Insufficient documentation

## 2019-07-30 DIAGNOSIS — R0603 Acute respiratory distress: Secondary | ICD-10-CM

## 2019-07-30 DIAGNOSIS — I5032 Chronic diastolic (congestive) heart failure: Secondary | ICD-10-CM | POA: Diagnosis not present

## 2019-07-30 DIAGNOSIS — T394X5A Adverse effect of antirheumatics, not elsewhere classified, initial encounter: Secondary | ICD-10-CM | POA: Insufficient documentation

## 2019-07-30 DIAGNOSIS — J9601 Acute respiratory failure with hypoxia: Secondary | ICD-10-CM | POA: Diagnosis present

## 2019-07-30 DIAGNOSIS — Z79899 Other long term (current) drug therapy: Secondary | ICD-10-CM | POA: Insufficient documentation

## 2019-07-30 DIAGNOSIS — Z6826 Body mass index (BMI) 26.0-26.9, adult: Secondary | ICD-10-CM | POA: Insufficient documentation

## 2019-07-30 DIAGNOSIS — E1165 Type 2 diabetes mellitus with hyperglycemia: Secondary | ICD-10-CM | POA: Diagnosis not present

## 2019-07-30 DIAGNOSIS — Z20822 Contact with and (suspected) exposure to covid-19: Secondary | ICD-10-CM | POA: Insufficient documentation

## 2019-07-30 DIAGNOSIS — F17290 Nicotine dependence, other tobacco product, uncomplicated: Secondary | ICD-10-CM | POA: Insufficient documentation

## 2019-07-30 DIAGNOSIS — F17210 Nicotine dependence, cigarettes, uncomplicated: Secondary | ICD-10-CM | POA: Insufficient documentation

## 2019-07-30 DIAGNOSIS — E66811 Obesity, class 1: Secondary | ICD-10-CM | POA: Diagnosis present

## 2019-07-30 DIAGNOSIS — Z7952 Long term (current) use of systemic steroids: Secondary | ICD-10-CM | POA: Insufficient documentation

## 2019-07-30 DIAGNOSIS — E669 Obesity, unspecified: Secondary | ICD-10-CM | POA: Diagnosis present

## 2019-07-30 DIAGNOSIS — M069 Rheumatoid arthritis, unspecified: Secondary | ICD-10-CM | POA: Diagnosis present

## 2019-07-30 DIAGNOSIS — T886XXA Anaphylactic reaction due to adverse effect of correct drug or medicament properly administered, initial encounter: Principal | ICD-10-CM | POA: Insufficient documentation

## 2019-07-30 DIAGNOSIS — T782XXA Anaphylactic shock, unspecified, initial encounter: Secondary | ICD-10-CM | POA: Diagnosis present

## 2019-07-30 DIAGNOSIS — G4733 Obstructive sleep apnea (adult) (pediatric): Secondary | ICD-10-CM | POA: Insufficient documentation

## 2019-07-30 DIAGNOSIS — I110 Hypertensive heart disease with heart failure: Secondary | ICD-10-CM | POA: Diagnosis not present

## 2019-07-30 DIAGNOSIS — R0602 Shortness of breath: Secondary | ICD-10-CM

## 2019-07-30 DIAGNOSIS — E119 Type 2 diabetes mellitus without complications: Secondary | ICD-10-CM

## 2019-07-30 DIAGNOSIS — F1010 Alcohol abuse, uncomplicated: Secondary | ICD-10-CM | POA: Diagnosis present

## 2019-07-30 DIAGNOSIS — Z720 Tobacco use: Secondary | ICD-10-CM | POA: Diagnosis present

## 2019-07-30 DIAGNOSIS — I700 Atherosclerosis of aorta: Secondary | ICD-10-CM | POA: Diagnosis not present

## 2019-07-30 DIAGNOSIS — T380X5A Adverse effect of glucocorticoids and synthetic analogues, initial encounter: Secondary | ICD-10-CM | POA: Diagnosis not present

## 2019-07-30 DIAGNOSIS — J449 Chronic obstructive pulmonary disease, unspecified: Secondary | ICD-10-CM | POA: Diagnosis present

## 2019-07-30 LAB — CBC WITH DIFFERENTIAL/PLATELET
Abs Immature Granulocytes: 0.2 10*3/uL — ABNORMAL HIGH (ref 0.00–0.07)
Band Neutrophils: 4 %
Basophils Absolute: 0 10*3/uL (ref 0.0–0.1)
Basophils Relative: 0 %
Eosinophils Absolute: 0.2 10*3/uL (ref 0.0–0.5)
Eosinophils Relative: 3 %
HCT: 47.5 % (ref 39.0–52.0)
Hemoglobin: 15.4 g/dL (ref 13.0–17.0)
Lymphocytes Relative: 41 %
Lymphs Abs: 2.1 10*3/uL (ref 0.7–4.0)
MCH: 31.5 pg (ref 26.0–34.0)
MCHC: 32.4 g/dL (ref 30.0–36.0)
MCV: 97.1 fL (ref 80.0–100.0)
Metamyelocytes Relative: 1 %
Monocytes Absolute: 0.1 10*3/uL (ref 0.1–1.0)
Monocytes Relative: 1 %
Myelocytes: 3 %
Neutro Abs: 2.6 10*3/uL (ref 1.7–7.7)
Neutrophils Relative %: 47 %
Platelets: 145 10*3/uL — ABNORMAL LOW (ref 150–400)
RBC: 4.89 MIL/uL (ref 4.22–5.81)
RDW: 15.9 % — ABNORMAL HIGH (ref 11.5–15.5)
Smear Review: NORMAL
WBC: 5 10*3/uL (ref 4.0–10.5)
nRBC: 1.2 % — ABNORMAL HIGH (ref 0.0–0.2)

## 2019-07-30 LAB — TROPONIN I (HIGH SENSITIVITY): Troponin I (High Sensitivity): 13 ng/L (ref <18)

## 2019-07-30 LAB — BASIC METABOLIC PANEL
Anion gap: 9 (ref 5–15)
BUN: 21 mg/dL (ref 8–23)
CO2: 22 mmol/L (ref 22–32)
Calcium: 9 mg/dL (ref 8.9–10.3)
Chloride: 108 mmol/L (ref 98–111)
Creatinine, Ser: 1.15 mg/dL (ref 0.61–1.24)
GFR calc Af Amer: >60 mL/min (ref >60)
GFR calc non Af Amer: >60 mL/min (ref >60)
Glucose, Bld: 130 mg/dL — ABNORMAL HIGH (ref 70–99)
Potassium: 4.7 mmol/L (ref 3.5–5.1)
Sodium: 139 mmol/L (ref 135–145)

## 2019-07-30 LAB — BLOOD GAS, VENOUS
Acid-base deficit: 7.5 mmol/L — ABNORMAL HIGH (ref 0.0–2.0)
Acid-base deficit: 4.1 mmol/L — ABNORMAL HIGH (ref 0.0–2.0)
Bicarbonate: 22.3 mmol/L (ref 20.0–28.0)
Bicarbonate: 23 mmol/L (ref 20.0–28.0)
O2 Saturation: 59.2 %
O2 Saturation: 67.1 %
Patient temperature: 37
Patient temperature: 37
pCO2, Ven: 64 mmHg — ABNORMAL HIGH (ref 44.0–60.0)
pCO2, Ven: 49 mmHg (ref 44.0–60.0)
pH, Ven: 7.15 — CL (ref 7.250–7.430)
pH, Ven: 7.28 (ref 7.250–7.430)
pO2, Ven: 41 mmHg (ref 32.0–45.0)
pO2, Ven: 40 mmHg (ref 32.0–45.0)

## 2019-07-30 LAB — GLUCOSE, CAPILLARY: Glucose-Capillary: 209 mg/dL — ABNORMAL HIGH (ref 70–99)

## 2019-07-30 LAB — BRAIN NATRIURETIC PEPTIDE: B Natriuretic Peptide: 140.8 pg/mL — ABNORMAL HIGH (ref 0.0–100.0)

## 2019-07-30 LAB — SARS CORONAVIRUS 2 BY RT PCR (HOSPITAL ORDER, PERFORMED IN ~~LOC~~ HOSPITAL LAB): SARS Coronavirus 2: NEGATIVE

## 2019-07-30 MED ORDER — LORAZEPAM 2 MG/ML IJ SOLN: 1.0000 mg | INTRAMUSCULAR | Status: DC | PRN

## 2019-07-30 MED ORDER — LORAZEPAM 2 MG/ML IJ SOLN: 0.0000 mg | Freq: Two times a day (BID) | INTRAMUSCULAR | Status: DC

## 2019-07-30 MED ORDER — ADULT MULTIVITAMIN W/MINERALS CH
1.0000 | ORAL_TABLET | Freq: Every day | ORAL | Status: DC
  Administered 2019-07-31: 1 via ORAL
  Filled 2019-07-30: qty 1

## 2019-07-30 MED ORDER — SODIUM CHLORIDE 0.9 % IV SOLN
500.0000 mg | INTRAVENOUS | Status: AC
  Administered 2019-07-30: 500 mg via INTRAVENOUS
  Filled 2019-07-30: qty 500

## 2019-07-30 MED ORDER — CARVEDILOL 3.125 MG PO TABS
3.1250 mg | ORAL_TABLET | Freq: Two times a day (BID) | ORAL | Status: DC
  Administered 2019-07-31: 3.125 mg via ORAL
  Filled 2019-07-30: qty 1

## 2019-07-30 MED ORDER — LORAZEPAM 2 MG/ML IJ SOLN: 0.0000 mg | Freq: Four times a day (QID) | INTRAMUSCULAR | Status: DC

## 2019-07-30 MED ORDER — THIAMINE HCL 100 MG PO TABS
100.0000 mg | ORAL_TABLET | Freq: Every day | ORAL | Status: DC
  Administered 2019-07-30 – 2019-07-31 (×2): 100 mg via ORAL
  Filled 2019-07-30 (×2): qty 1

## 2019-07-30 MED ORDER — INSULIN ASPART 100 UNIT/ML ~~LOC~~ SOLN
0.0000 [IU] | Freq: Three times a day (TID) | SUBCUTANEOUS | Status: DC
  Administered 2019-07-31: 5 [IU] via SUBCUTANEOUS
  Administered 2019-07-31: 3 [IU] via SUBCUTANEOUS
  Filled 2019-07-30 (×2): qty 1

## 2019-07-30 MED ORDER — ALBUTEROL SULFATE (2.5 MG/3ML) 0.083% IN NEBU
15.0000 mL | INHALATION_SOLUTION | Freq: Once | RESPIRATORY_TRACT | Status: AC
  Administered 2019-07-30: 15 mL via RESPIRATORY_TRACT
  Filled 2019-07-30: qty 15

## 2019-07-30 MED ORDER — DIPHENHYDRAMINE HCL 50 MG/ML IJ SOLN
25.0000 mg | Freq: Once | INTRAMUSCULAR | Status: AC
  Administered 2019-07-30: 25 mg via INTRAVENOUS
  Filled 2019-07-30: qty 1

## 2019-07-30 MED ORDER — ALBUTEROL SULFATE (2.5 MG/3ML) 0.083% IN NEBU: 2.5000 mg | INHALATION_SOLUTION | Freq: Once | RESPIRATORY_TRACT | Status: DC

## 2019-07-30 MED ORDER — HYDROCODONE-ACETAMINOPHEN 5-325 MG PO TABS
1.0000 | ORAL_TABLET | Freq: Every day | ORAL | Status: DC | PRN
  Administered 2019-07-31: 1 via ORAL
  Filled 2019-07-30: qty 1

## 2019-07-30 MED ORDER — IPRATROPIUM-ALBUTEROL 0.5-2.5 (3) MG/3ML IN SOLN
3.0000 mL | Freq: Four times a day (QID) | RESPIRATORY_TRACT | Status: DC
  Administered 2019-07-30 – 2019-07-31 (×3): 3 mL via RESPIRATORY_TRACT
  Filled 2019-07-30 (×5): qty 3

## 2019-07-30 MED ORDER — PREDNISONE 20 MG PO TABS
40.0000 mg | ORAL_TABLET | Freq: Every day | ORAL | Status: DC
  Administered 2019-07-30 – 2019-07-31 (×2): 40 mg via ORAL
  Filled 2019-07-30 (×2): qty 2

## 2019-07-30 MED ORDER — IPRATROPIUM-ALBUTEROL 0.5-2.5 (3) MG/3ML IN SOLN
3.0000 mL | Freq: Once | RESPIRATORY_TRACT | Status: AC
  Administered 2019-07-30: 3 mL via RESPIRATORY_TRACT

## 2019-07-30 MED ORDER — FOLIC ACID 1 MG PO TABS
1.0000 mg | ORAL_TABLET | Freq: Every day | ORAL | Status: DC
  Administered 2019-07-30 – 2019-07-31 (×2): 1 mg via ORAL
  Filled 2019-07-30 (×2): qty 1

## 2019-07-30 MED ORDER — ENOXAPARIN SODIUM 40 MG/0.4ML ~~LOC~~ SOLN
40.0000 mg | SUBCUTANEOUS | Status: DC
  Administered 2019-07-30: 40 mg via SUBCUTANEOUS
  Filled 2019-07-30: qty 0.4

## 2019-07-30 MED ORDER — INSULIN ASPART 100 UNIT/ML ~~LOC~~ SOLN
0.0000 [IU] | Freq: Every day | SUBCUTANEOUS | Status: DC
  Administered 2019-07-30: 2 [IU] via SUBCUTANEOUS
  Filled 2019-07-30: qty 1

## 2019-07-30 MED ORDER — ACETAMINOPHEN 325 MG PO TABS: 650.0000 mg | ORAL_TABLET | Freq: Four times a day (QID) | ORAL | Status: DC | PRN

## 2019-07-30 MED ORDER — ALBUTEROL SULFATE (2.5 MG/3ML) 0.083% IN NEBU
INHALATION_SOLUTION | RESPIRATORY_TRACT | Status: AC
  Administered 2019-07-30: 2.5 mg
  Filled 2019-07-30: qty 15

## 2019-07-30 MED ORDER — FAMOTIDINE IN NACL 20-0.9 MG/50ML-% IV SOLN
20.0000 mg | Freq: Once | INTRAVENOUS | Status: AC
  Administered 2019-07-30: 20 mg via INTRAVENOUS
  Filled 2019-07-30: qty 50

## 2019-07-30 MED ORDER — LORAZEPAM 1 MG PO TABS
1.0000 mg | ORAL_TABLET | ORAL | Status: DC | PRN
  Administered 2019-07-31: 2 mg via ORAL
  Filled 2019-07-30: qty 2

## 2019-07-30 MED ORDER — EPINEPHRINE 0.3 MG/0.3ML IJ SOAJ
0.3000 mg | Freq: Once | INTRAMUSCULAR | Status: AC
  Administered 2019-07-30: 0.3 mg via INTRAMUSCULAR

## 2019-07-30 MED ORDER — AZITHROMYCIN 250 MG PO TABS: 500.0000 mg | ORAL_TABLET | Freq: Every day | ORAL | Status: DC

## 2019-07-30 MED ORDER — THIAMINE HCL 100 MG/ML IJ SOLN: 100.0000 mg | Freq: Every day | INTRAMUSCULAR | Status: DC

## 2019-07-30 NOTE — ED Notes (Addendum)
Dr. Joan Mayans at bedside with this RN, Joellen Jersey, and Laury Axon, tech.

## 2019-07-30 NOTE — ED Notes (Signed)
Pt is shob and anxious at this time. Pt states his breathing has gotten worse today.

## 2019-07-30 NOTE — ED Notes (Signed)
RT at bedside with bipap

## 2019-07-30 NOTE — ED Triage Notes (Signed)
Pt arrival from clinic due to possibly allergic reaction to infusion for RA. Pt was receiving infusion when about 45 minutes into it, he started having shob. Pt was given 125 solumedrol and 25 mg of benadryl and then brought here.   Pt has hx of COPD and hasn't taken his inhaler for it.

## 2019-07-30 NOTE — ED Notes (Signed)
Duoneb started by Nira Conn, RN per Dr. Joan Mayans verbal order.

## 2019-07-30 NOTE — Progress Notes (Signed)
Pt taken off Bipap and placed on a 3L nasal cannula. He states he is breathing so much better. Ice chips offered.

## 2019-07-30 NOTE — ED Notes (Signed)
Epi pen given per Dr. Royden Purl verbal order.

## 2019-07-30 NOTE — H&P (Signed)
[facility] History and Physical   Patient: Daniel Frye   PCP: Juline Patch, MD DOB: 11-Jun-1950   DOA: 07/30/2019   DOS: 07/30/2019   DOS: the patient was seen and examined on 07/30/2019  Patient coming from: The patient is coming from Home  Chief Complaint: Shortness of breath/anaphylactic reaction  HPI: Daniel Frye is a 69 y.o. male with Past medical history of COPD, chronic diastolic CHF, depression, rheumatoid arthritis, alcohol abuse, smoking, HTN. Patient presented with complaints of shortness of breath. Patient was at the [facility] clinic receiving IV infliximab infusion.  45 minutes later patient started having progressively worsening shortness of breath and therefore he was brought to the ER. Patient was on BiPAP the time of my evaluation. Denies any complaints of chest pain or abdominal pain no nausea no vomiting.  No headache.  No dizziness or lightheadedness. He mentions that he has been having shortness of breath prior to going for the infusion.  ED Course: Patient received IV Benadryl, IV epinephrine, and albuterol treatment in the ER. Started on BiPAP based on the ABG. Successfully was able to transition off of the BiPAP after my evaluation.  Referred for admission.  At his baseline ambulates without assistance independent for most of his ADL;  manages his medication on his own.  Review of Systems: as mentioned in the history of present illness.  All other systems reviewed and are negative.  Past Medical History:  Diagnosis Date  . Alcohol abuse   . CHF (congestive heart failure) ([place])   . Cocaine use   . COPD (chronic obstructive pulmonary disease) ([place])   . Depression   . Diabetes mellitus without complication ([place])   . Hypertension   . Marijuana use   . 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 Rheumatoid arthritis ([place])   . Substance abuse ([place])   . Tobacco use    Past Surgical History:  Procedure  Laterality Date  . heart surgery to drain fluid N/A 1987   Heart surgery to remove fluid    Social History:  reports that he has been smoking cigarettes and cigars. He has a 12.75 pack-year smoking history. He has never used smokeless tobacco. He reports current alcohol use of about 61.0 standard drinks of alcohol per week. He reports current drug use. Drugs: "Crack" cocaine and Marijuana.  Allergies  Allergen Reactions  . Renflexis [Infliximab] Anaphylaxis   Family history reviewed and not pertinent Family History  Problem Relation Age of Onset  . COPD Brother   . Arthritis Brother   . Diabetes Brother   . Hypertension Brother      Prior to Admission medications   Medication Sig Start Date End Date Taking? Authorizing Provider  folic acid (FOLVITE) 1 MG tablet Take 1 mg by mouth daily. 07/09/19  Yes [provider]  HYDROcodone-acetaminophen (NORCO/VICODIN) 5-325 MG tablet Take 1 tablet by mouth daily as needed. 07/15/19  Yes [provider]  methotrexate 2.5 MG tablet Take 8 tablets by mouth once a week. 06/23/19  Yes [provider]  predniSONE (DELTASONE) 10 MG tablet Take 10 mg by mouth daily. 05/08/19  Yes [provider]  albuterol (PROVENTIL HFA;VENTOLIN HFA) 108 (90 Base) MCG/ACT inhaler INHALE 2 PUFFS BY MOUTH EVERY 4 HOURS AS NEEDED FOR WHEEZING/ SHORTNESS OF BREATH 05/15/18   Juline Patch, MD  albuterol (PROVENTIL) (2.5 MG/3ML) 0.083% nebulizer solution Take 3 mLs (2.5 mg total) by nebulization every 6 (six)  hours as needed for wheezing or shortness of breath. 05/15/18   Juline Patch, MD    Physical Exam: Vitals:   07/30/19 1915 07/30/19 1930 07/30/19 1945 07/30/19 2000  BP: 125/84 131/83 136/88 (!) 143/98  Pulse: 79 80  91  Resp: 19 18 20 19   Temp:      TempSrc:      SpO2: 100% 100%  97%  Weight:      Height:        General: alert and oriented to time, place, and person. Appear in moderate distress, affect flat Eyes: PERRL,  Conjunctiva normal ENT: Oral Mucosa Clear, moist  Neck: difficult to assess  JVD, no Abnormal Mass Or lumps Cardiovascular: S1 and S2 Present, no Murmur, peripheral pulses symmetrical Respiratory: good respiratory effort, Bilateral Air entry equal and Decreased, no signs of accessory muscle use, Clear to Auscultation, bno Crackles, no wheezes Abdomen: Bowel Sound present, Soft and no tenderness, no hernia Skin: no rashes  Extremities: no Pedal edema, no calf tenderness Neurologic: without any new focal findings Gait not checked due to patient safety concerns  Data Reviewed: I have personally reviewed and interpreted labs, imaging as discussed below.  CBC: Recent Labs  Lab 07/30/19 1525  WBC 5.0  NEUTROABS 2.6  HGB 15.4  HCT 47.5  MCV 97.1  PLT [id number]   Basic Metabolic Panel: Recent Labs  Lab 07/30/19 1525  NA 139  K 4.7  CL 108  CO2 22  GLUCOSE 130*  BUN 21  CREATININE 1.15  CALCIUM 9.0   GFR: Estimated Creatinine Clearance: 61.7 mL/min (by C-G formula based on SCr of 1.15 mg/dL). Liver Function Tests: No results for input(s): AST, ALT, ALKPHOS, BILITOT, PROT, ALBUMIN in the last 168 hours. No results for input(s): LIPASE, AMYLASE in the last 168 hours. No results for input(s): AMMONIA in the last 168 hours. Coagulation Profile: No results for input(s): INR, PROTIME in the last 168 hours. Cardiac Enzymes: No results for input(s): CKTOTAL, CKMB, CKMBINDEX, TROPONINI in the last 168 hours. BNP (last 3 results) No results for input(s): PROBNP in the last 8760 hours. HbA1C: No results for input(s): HGBA1C in the last 72 hours. CBG: No results for input(s): GLUCAP in the last 168 hours. Lipid Profile: No results for input(s): CHOL, HDL, LDLCALC, TRIG, CHOLHDL, LDLDIRECT in the last 72 hours. Thyroid Function Tests: No results for input(s): TSH, T4TOTAL, FREET4, T3FREE, THYROIDAB in the last 72 hours. Anemia Panel: No results for input(s): VITAMINB12, FOLATE,  FERRITIN, TIBC, IRON, RETICCTPCT in the last 72 hours. Urine analysis:    Component Value Date/Time   COLORURINE STRAW (A) 04/15/2016 1802   APPEARANCEUR CLEAR (A) 04/15/2016 1802   LABSPEC 1.011 04/15/2016 1802   PHURINE 6.0 04/15/2016 1802   GLUCOSEU 50 (A) 04/15/2016 1802   HGBUR NEGATIVE 04/15/2016 1802   BILIRUBINUR NEGATIVE 04/15/2016 1802   KETONESUR 5 (A) 04/15/2016 1802   PROTEINUR 100 (A) 04/15/2016 1802   NITRITE NEGATIVE 04/15/2016 1802   LEUKOCYTESUR NEGATIVE 04/15/2016 1802    Radiological Exams on Admission: DG Chest Portable 1 View  Result Date: 07/30/2019 CLINICAL DATA:  Shortness of breath, possible allergic reaction EXAM: PORTABLE CHEST 1 VIEW COMPARISON:  10/04/2018 FINDINGS: Likely chronic mild interstitial prominence. No pleural effusion or pneumothorax. Cardiomediastinal contours are within normal limits. IMPRESSION: No acute process in the chest. Electronically Signed   By: Macy Mis M.D.   On: 07/30/2019 15:45   I reviewed all nursing notes, pharmacy notes, vitals, pertinent old records.  Assessment/Plan 1. Acute respiratory failure with hypoxia (HCC) Acute mild COPD exacerbation. Continue BiPAP as needed. Admit to progressive care unit. Starting on azithromycin. Also on prednisone taper DuoNeb's.  2.  History of alcohol abuse. Continue CIWA protocol.  3.  Rheumatoid arthritis  Continue prednisone.  Infliximab added to Allergy list although not sure whether patient actually had an allergic reaction or not.  4.  Essential hypertension Continue Coreg.  Nutrition: Cardiac diet DVT Prophylaxis: Subcutaneous Lovenox  Advance goals of care discussion: Full code   Consults: none   Family Communication: no family was present at bedside, at the time of interview.   Disposition:  Discharge to home, in 1 days.  Author: Berle Mull, MD [facility] 07/30/2019 8:18 PM   To reach On-call, see care teams to locate the attending and reach  out to them via [email or web address]. If 7PM-7AM, please contact night-coverage If you still have difficulty reaching the attending provider, please page the [facility] (Director on Call) for [facility] on amion for assistance.

## 2019-07-30 NOTE — ED Notes (Signed)
Pt given dinner tray at this time.  

## 2019-07-30 NOTE — ED Provider Notes (Signed)
[facility] Emergency Department Provider Note  ____________________________________________   First MD Initiated Contact with Patient 07/30/19 1525     (approximate)  I have reviewed the triage vital signs and the nursing notes.  History  Chief Complaint Shortness of Breath    HPI Daniel Frye is a 69 y.o. male with history of COPD, DM, substance use, RA, who presents to the emergency department with acute onset of shortness of breath and respiratory distress.  Patient was at [facility] clinic today receiving an IV infusion for his RA, Renflexis.  About an hour and a half into his infusion he developed acute onset of shortness of breath and difficulty breathing.  Clinic with concern for potential allergic reaction, and administered 125 mg Solu-Medrol and 25 mg Benadryl IV.  He was immediately transported to the ER where he was noted to be in significant respiratory distress, tachypneic, with accessory muscle use, decreased air movement bilaterally, and notable wheezing.  No rashes or urticaria.  No vomiting or diarrhea.  Patient has had this infusion once before on 5/10 and tolerated well.  Denies any history of anaphylaxis.  Does report mild increase in his COPD symptoms over the last several days, including wheezing and cough, but reports compliance with his medications.  Does not normally wear supplemental oxygen with regards to his COPD.   Past Medical Hx Past Medical History:  Diagnosis Date  . Alcohol abuse   . CHF (congestive heart failure) ([place])   . Cocaine use   . COPD (chronic obstructive pulmonary disease) ([place])   . Depression   . Diabetes mellitus without complication ([place])   . Hypertension   . Marijuana use   . 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 Rheumatoid arthritis ([place])   . Substance abuse ([place])   . Tobacco use     Problem List Patient Active Problem List   Diagnosis Date Noted  . Aortic atherosclerosis  ([place]) 05/15/2018  . Hypokalemia 03/03/2016  . Hypomagnesemia 03/03/2016  . Substance induced mood disorder ([place]) 03/02/2016  . Alcohol withdrawal ([place]) 02/29/2016  . Crack cocaine use 01/22/2016  . Obstructive sleep apnea of adult 03/30/2015  . SOB (shortness of breath) on exertion 03/30/2015  . Edema of foot 03/30/2015  . Chronic diastolic CHF (congestive heart failure), NYHA class 2 ([place]) 03/30/2015  . Alcohol abuse 03/23/2015  . Noncompliance 03/23/2015  . Vitamin D deficiency 01/27/2015  . Diabetes mellitus type 2, controlled, without complications ([place]) 99991111  . Obesity (BMI 30.0-34.9) 11/26/2014  . COPD (chronic obstructive pulmonary disease) ([place]) 06/22/2014  . Essential (primary) hypertension 06/22/2014  . Rheumatoid arthritis ([place]) 06/22/2014  . Tobacco abuse 06/22/2014  . Chronic obstructive pulmonary disease with acute exacerbation ([place]) 06/22/2014    Past Surgical Hx Past Surgical History:  Procedure Laterality Date  . heart surgery to drain fluid N/A 1987   Heart surgery to remove fluid     Medications Prior to Admission medications   Medication Sig Start Date End Date Taking? Authorizing Provider  albuterol (PROVENTIL HFA;VENTOLIN HFA) 108 (90 Base) MCG/ACT inhaler INHALE 2 PUFFS BY MOUTH EVERY 4 HOURS AS NEEDED FOR WHEEZING/ SHORTNESS OF BREATH 05/15/18   Juline Patch, MD  albuterol (PROVENTIL) (2.5 MG/3ML) 0.083% nebulizer solution Take 3 mLs (2.5 mg total) by nebulization every 6 (six) hours as needed for wheezing or shortness of breath. 05/15/18   Juline Patch, MD  carvedilol (COREG) 6.25 MG tablet Take 1 tablet (  6.25 mg total) by mouth 2 (two) times daily with a meal. 05/15/18   Juline Patch, MD  ibuprofen (ADVIL) 800 MG tablet Take 800 mg by mouth every 6 (six) hours as needed for mild pain or moderate pain.    [provider]  oxycodone (OXY-IR) 5 MG capsule Take 5 mg by mouth every 4 (four) hours as needed for pain.    [provider]    Allergies Patient has no known allergies.  Family Hx Family History  Problem Relation Age of Onset  . COPD Brother   . Arthritis Brother   . Diabetes Brother   . Hypertension Brother     Social Hx Social History   Tobacco Use  . Smoking status: Current Every Day Smoker    Packs/day: 0.25    Years: 51.00    Pack years: 12.75    Types: Cigarettes, Cigars  . Smokeless tobacco: Never Used  Substance Use Topics  . Alcohol use: Yes    Alcohol/week: 61.0 standard drinks    Types: 12 Cans of beer, 49 Standard drinks or equivalent per week    Comment: Drinks 1/5 day of wine   . Drug use: Yes    Types: "Crack" cocaine, Marijuana    Comment: Uses only once in awhile and does not look for it but has hard time when it is there tunrning it down.      Review of Systems  Constitutional: Negative for fever. Negative for chills. Eyes: Negative for visual changes. ENT: Negative for sore throat. Cardiovascular: Negative for chest pain. Respiratory: + for shortness of breath. Gastrointestinal: Negative for nausea. Negative for vomiting.  Genitourinary: Negative for dysuria. Musculoskeletal: Negative for leg swelling. Skin: Negative for rash. Neurological: Negative for headaches.   Physical Exam  Vital Signs: ED Triage Vitals  Enc Vitals Group     BP 07/30/19 1531 (!) 182/108     Pulse Rate 07/30/19 1531 92     Resp 07/30/19 1531 (!) 26     Temp 07/30/19 1531 98.2 F (36.8 C)     Temp Source 07/30/19 1531 Axillary     SpO2 07/30/19 1531 100 %     Weight 07/30/19 1525 183 lb (83 kg)     Height 07/30/19 1525 5\' 10"  (1.778 m)     Head Circumference --      Peak Flow --      Pain Score 07/30/19 1525 0     Pain Loc --      Pain Edu? --      Excl. in [place]? --     Constitutional: Alert and oriented. In acute respiratory distress. Accessory muscle use. Speaking in short sentences.  Head: Normocephalic. Atraumatic. Eyes: Conjunctivae clear. Sclera anicteric.  Pupils equal and symmetric. Nose: No masses or lesions. No congestion or rhinorrhea. Mouth/Throat: Wearing mask.  Neck: No stridor. Trachea midline.  Cardiovascular: Normal rate, regular rhythm. Extremities well perfused. Respiratory: In acute respiratory distress with accessory muscle use, speaking in short sentences.  Decreased air movement throughout with expiratory wheezing and course lung sounds.  No hypoxia.  Immediately started nebulizers and then transitioned to BiPAP due to work of breathing. Gastrointestinal: Soft. Non-distended. Non-tender.  Genitourinary: Deferred. Musculoskeletal: No lower extremity edema. No deformities. Neurologic:  Normal speech and language. No gross focal or lateralizing neurologic deficits are appreciated.  Skin: Skin is warm, dry and intact. No rash noted.  No urticaria or hives. Psychiatric: Mood and affect are appropriate for situation.  EKG  Personally reviewed and interpreted by myself.   Date: 07/30/19 Time: 1527 Rate: 107 Rhythm: sinus Axis: normal Intervals: WNL PVC No STEMI    Radiology  Personally reviewed available imaging myself.   CXR - IMPRESSION:  No acute process in the chest.    Procedures  Procedure(s) performed (including critical care):  .Critical Care Performed by: Lilia Pro., MD Authorized by: Lilia Pro., MD   Critical care provider statement:    Critical care time (minutes):  48   Critical care was time spent personally by me on the following activities:  Discussions with consultants, evaluation of patient's response to treatment, examination of patient, ordering and performing treatments and interventions, ordering and review of laboratory studies, ordering and review of radiographic studies, pulse oximetry, re-evaluation of patient's condition, obtaining history from patient or surrogate and review of old charts     Initial Impression / Assessment and Plan / MDM / ED Course  69 y.o. male  with  history of COPD, DM, substance use, RA, who presents to the emergency department with acute onset of shortness of breath and respiratory distress.  Patient was at [facility] clinic today receiving an IV infusion for his RA, Renflexis.  About an hour and a half into his infusion he developed acute onset of shortness of breath and difficulty breathing.  Clinic with concern for potential allergic reaction, and administered 125 mg Solu-Medrol and 25 mg Benadryl IV.  He was immediately transported to the ER where he was noted to be in significant respiratory distress, tachypneic, with accessory muscle use, decreased air movement bilaterally, and notable wheezing.  No rashes or urticaria.  No vomiting or diarrhea.  Immediately started DuoNeb treatment x 3 and then transitioned to BiPAP due to his work of breathing, 15 mg albuterol through BiPAP.  Administered EpiPen due to concern for potential allergic reaction.  Given supplemental 25 mg Benadryl IV and 20 mg famotidine to complete allergy cocktail.  Other differential also includes COPD exacerbation, flash pulmonary edema, other pulmonary infection.  Will continue to support respiratory status, obtain labs, imaging, and continue to monitor closely.  4:30 PM Patient with significant improvement in his work of breathing, much more comfortable. Tolerating BiPAP well. Normal respiratory rate, no accessory muscle use, reports subjective improvement in his breathing as well. Still with wheezing bilaterally but air movement much improved.  CXR is unremarkable, BNP unremarkable.  VBG obtained on arrival reveals pH 7.15, pCO2 64, normal pO2. Will recheck again now given his improvement. COVID negative. Will proceed w/ admission. Patient agreeable.    _______________________________   As part of my medical decision making I have reviewed available labs, radiology tests, reviewed old records/performed chart review.    Final Clinical Impression(s) / ED Diagnosis  Final  diagnoses:  SOB (shortness of breath)  Respiratory distress       Note:  This document was prepared using Dragon voice recognition software and may include unintentional dictation errors.   Lilia Pro., MD 07/30/19 [phone]

## 2019-07-30 NOTE — Plan of Care (Signed)

## 2019-07-31 DIAGNOSIS — Z720 Tobacco use: Secondary | ICD-10-CM

## 2019-07-31 DIAGNOSIS — T782XXA Anaphylactic shock, unspecified, initial encounter: Secondary | ICD-10-CM | POA: Diagnosis present

## 2019-07-31 DIAGNOSIS — J9601 Acute respiratory failure with hypoxia: Secondary | ICD-10-CM | POA: Diagnosis not present

## 2019-07-31 DIAGNOSIS — F1010 Alcohol abuse, uncomplicated: Secondary | ICD-10-CM

## 2019-07-31 DIAGNOSIS — T782XXD Anaphylactic shock, unspecified, subsequent encounter: Secondary | ICD-10-CM

## 2019-07-31 DIAGNOSIS — J441 Chronic obstructive pulmonary disease with (acute) exacerbation: Secondary | ICD-10-CM | POA: Diagnosis not present

## 2019-07-31 DIAGNOSIS — I10 Essential (primary) hypertension: Secondary | ICD-10-CM

## 2019-07-31 DIAGNOSIS — T886XXA Anaphylactic reaction due to adverse effect of correct drug or medicament properly administered, initial encounter: Secondary | ICD-10-CM | POA: Diagnosis not present

## 2019-07-31 LAB — URINE DRUG SCREEN, QUALITATIVE (ARMC ONLY)
Amphetamines, Ur Screen: NOT DETECTED
Barbiturates, Ur Screen: NOT DETECTED
Benzodiazepine, Ur Scrn: NOT DETECTED
Cannabinoid 50 Ng, Ur ~~LOC~~: POSITIVE — AB
Cocaine Metabolite,Ur ~~LOC~~: POSITIVE — AB
MDMA (Ecstasy)Ur Screen: NOT DETECTED
Methadone Scn, Ur: NOT DETECTED
Opiate, Ur Screen: NOT DETECTED
Phencyclidine (PCP) Ur S: NOT DETECTED
Tricyclic, Ur Screen: NOT DETECTED

## 2019-07-31 LAB — BASIC METABOLIC PANEL
Anion gap: 9 (ref 5–15)
BUN: 19 mg/dL (ref 8–23)
CO2: 20 mmol/L — ABNORMAL LOW (ref 22–32)
Calcium: 8.6 mg/dL — ABNORMAL LOW (ref 8.9–10.3)
Chloride: 106 mmol/L (ref 98–111)
Creatinine, Ser: 1.03 mg/dL (ref 0.61–1.24)
GFR calc Af Amer: >60 mL/min (ref >60)
GFR calc non Af Amer: >60 mL/min (ref >60)
Glucose, Bld: 202 mg/dL — ABNORMAL HIGH (ref 70–99)
Potassium: 4.5 mmol/L (ref 3.5–5.1)
Sodium: 135 mmol/L (ref 135–145)

## 2019-07-31 LAB — CBC
HCT: 38.2 % — ABNORMAL LOW (ref 39.0–52.0)
Hemoglobin: 13 g/dL (ref 13.0–17.0)
MCH: 31.3 pg (ref 26.0–34.0)
MCHC: 34 g/dL (ref 30.0–36.0)
MCV: 92 fL (ref 80.0–100.0)
Platelets: 123 10*3/uL — ABNORMAL LOW (ref 150–400)
RBC: 4.15 MIL/uL — ABNORMAL LOW (ref 4.22–5.81)
RDW: 15.9 % — ABNORMAL HIGH (ref 11.5–15.5)
WBC: 9.3 10*3/uL (ref 4.0–10.5)
nRBC: 0 % (ref 0.0–0.2)

## 2019-07-31 LAB — HIV ANTIBODY (ROUTINE TESTING W REFLEX): HIV Screen 4th Generation wRfx: NONREACTIVE

## 2019-07-31 LAB — HEMOGLOBIN A1C
Hgb A1c MFr Bld: 5.7 % — ABNORMAL HIGH (ref 4.8–5.6)
Mean Plasma Glucose: 116.89 mg/dL

## 2019-07-31 LAB — GLUCOSE, CAPILLARY
Glucose-Capillary: 171 mg/dL — ABNORMAL HIGH (ref 70–99)
Glucose-Capillary: 206 mg/dL — ABNORMAL HIGH (ref 70–99)

## 2019-07-31 MED ORDER — PREDNISONE 20 MG PO TABS: 40.0000 mg | ORAL_TABLET | Freq: Every day | ORAL | 0 refills | Status: AC

## 2019-07-31 MED ORDER — DIPHENHYDRAMINE HCL 25 MG PO TABS: 25.0000 mg | ORAL_TABLET | Freq: Four times a day (QID) | ORAL | 0 refills | Status: DC | PRN

## 2019-07-31 MED ORDER — FAMOTIDINE 20 MG PO TABS
20.0000 mg | ORAL_TABLET | Freq: Two times a day (BID) | ORAL | 0 refills | Status: DC
Start: 2019-07-31 — End: 2019-10-02

## 2019-07-31 NOTE — Progress Notes (Signed)
Patient taken off floor via wheelchair by staff. On Ra no distress noted.

## 2019-07-31 NOTE — Discharge Summary (Signed)
Physician Discharge Summary  Daniel Frye [id number] DOB: December 30, 1950 DOA: 07/30/2019  PCP: Juline Patch, MD  Admit date: 07/30/2019 Discharge date: 07/31/2019  Admitted From: Home Disposition: Home  Recommendations for Outpatient Follow-up:  Follow up with PCP in 1 week.  Follow-up with rheumatologist as scheduled.  Home Health: None Equipment/Devices: None  Discharge Condition: Fair CODE STATUS: Full code Diet recommendation: Heart Healthy / Carb Modified      Discharge Diagnoses:  Principal Problem:   Acute anaphylaxis  Active Problems:   COPD (chronic obstructive pulmonary disease), with acute exacerbation ([place])   Essential (primary) hypertension   Rheumatoid arthritis ([place])   Obesity (BMI 30.0-34.9)   Diabetes mellitus type 2, controlled, without complications ([place])   Tobacco abuse   Acute respiratory failure with hypoxia (HCC)   Alcohol abuse, daily use  Brief narrative/HPI 69 year old male with history of COPD with ongoing tobacco use, chronic diastolic CHF, ongoing alcohol use, hypertension, history of cocaine use, depression and rheumatoid arthritis who received IV infliximab infusion on 5/25.  About 45 minutes later he started having diffuse itching and progressive shortness of breath and was brought to the ED.  In the ED he was in acute respiratory failure with hypoxia requiring BiPAP.  Reported he was having some shortness of breath before going to the infusion and had received infusion few weeks back without any issues. In the ED he was given IV epinephrine, IV Benadryl and albuterol neb.  BiPAP weaned off shortly in the ED. Placed on observation for further management.  Patient was tested negative for COVID-19.  Hospital course   Principal problem Acute respiratory failure with hypoxia (HCC) Secondary to possible acute anaphylactic reaction with IV infliximab infusion and mild acute COPD exacerbation. Required BiPAP initially and was monitored on  progressive care unit.  Received DuoNeb. Symptoms have resolved and stable on room air this morning.  No further respiratory symptoms.  I will discharge him on oral prednisone over the next 5 days along with Pepcid 20 mg twice daily and Benadryl as needed for itching/allergies. Continue as needed albuterol nebs and inhaler at home.  Infliximab has been listed as allergy and patient should follow-up with rheumatologist in 1-2 weeks.   active problems Essential hypertension Continue Coreg.  Alcohol abuse Reports having 3-4 drinks a day.  Counseled strongly on cessation.  No signs of withdrawal.  Tobacco use Smokes about 1 pack/day.  Counseled strongly on cessation.  Refused nicotine patch and plans on quitting.  Rheumatoid arthritis On methotrexate as outpatient.  Infliximab listed as allergy.  Follow-up with rheumatologist.  Hyperglycemia Secondary to steroid use.  A1c of 5.7 suggestive of prediabetes.  Consults: None Disposition: Home   Discharge Instructions   Allergies as of 07/31/2019      Reactions   Renflexis [infliximab] Anaphylaxis      Medication List    TAKE these medications   albuterol 108 (90 Base) MCG/ACT inhaler Commonly known as: VENTOLIN HFA INHALE 2 PUFFS BY MOUTH EVERY 4 HOURS AS NEEDED FOR WHEEZING/ SHORTNESS OF BREATH   albuterol (2.5 MG/3ML) 0.083% nebulizer solution Commonly known as: PROVENTIL Take 3 mLs (2.5 mg total) by nebulization every 6 (six) hours as needed for wheezing or shortness of breath.   diphenhydrAMINE 25 MG tablet Commonly known as: BENADRYL Take 1 tablet (25 mg total) by mouth every 6 (six) hours as needed for itching.   famotidine 20 MG tablet Commonly known as: PEPCID Take 1 tablet (20 mg total) by mouth 2 (two) times daily  for 5 days.   folic acid 1 MG tablet Commonly known as: FOLVITE Take 1 mg by mouth daily.   HYDROcodone-acetaminophen 5-325 MG tablet Commonly known as: NORCO/VICODIN Take 1 tablet by mouth daily  as needed.   methotrexate 2.5 MG tablet Take 8 tablets by mouth once a week.   predniSONE 20 MG tablet Commonly known as: DELTASONE Take 2 tablets (40 mg total) by mouth daily with breakfast for 5 days. Start taking on: Aug 01, 2019 What changed:   medication strength  how much to take  when to take this      Follow-up Information    Juline Patch, MD. Schedule an appointment as soon as possible for a visit in 2 week(s).   Specialty: Family Medicine Contact information: [address] [place] [address] [phone]          Allergies  Allergen Reactions  . Renflexis [Infliximab] Anaphylaxis        Procedures/Studies: DG Chest Portable 1 View  Result Date: 07/30/2019 CLINICAL DATA:  Shortness of breath, possible allergic reaction EXAM: PORTABLE CHEST 1 VIEW COMPARISON:  10/04/2018 FINDINGS: Likely chronic mild interstitial prominence. No pleural effusion or pneumothorax. Cardiomediastinal contours are within normal limits. IMPRESSION: No acute process in the chest. Electronically Signed   By: Macy Mis M.D.   On: 07/30/2019 15:45       Subjective: Seen and examined.  Denies further itching or respiratory discomfort.  Discharge Exam: Vitals:   07/31/19 0421 07/31/19 0805  BP: (!) 154/91 (!) 152/96  Pulse: 74 66  Resp:  19  Temp: 98 F (36.7 C) 97.7 F (36.5 C)  SpO2: 100% 100%   Vitals:   07/30/19 2031 07/31/19 0206 07/31/19 0421 07/31/19 0805  BP: (!) 152/91  (!) 154/91 (!) 152/96  Pulse:   74 66  Resp:    19  Temp: 98 F (36.7 C)  98 F (36.7 C) 97.7 F (36.5 C)  TempSrc: Oral  Oral Oral  SpO2:  96% 100% 100%  Weight: 82 kg  82.4 kg   Height: 5\' 10"  (1.778 m)       General: Elderly male not in distress HEENT: Moist mucosa, supple neck Chest: Clear bilaterally, no added sounds CVs: Normal S1-S2, no murmurs GI: Soft, nondistended, nontender Musculoskeletal: Warm, no edema   The results of significant diagnostics  from this hospitalization (including imaging, microbiology, ancillary and laboratory) are listed below for reference.     Microbiology: Recent Results (from the past 240 hour(s))  SARS Coronavirus 2 by RT PCR (hospital order, performed in [facility] hospital lab) Nasopharyngeal Nasopharyngeal Swab     Status: None   Collection Time: 07/30/19  3:00 PM   Specimen: Nasopharyngeal Swab  Result Value Ref Range Status   SARS Coronavirus 2 NEGATIVE NEGATIVE Final    Comment: (NOTE) SARS-CoV-2 target nucleic acids are NOT DETECTED. The SARS-CoV-2 RNA is generally detectable in upper and lower respiratory specimens during the acute phase of infection. The lowest concentration of SARS-CoV-2 viral copies this assay can detect is 250 copies / mL. A negative result does not preclude SARS-CoV-2 infection and should not be used as the sole basis for treatment or other patient management decisions.  A negative result may occur with improper specimen collection / handling, submission of specimen other than nasopharyngeal swab, presence of viral mutation(s) within the areas targeted by this assay, and inadequate number of viral copies (<250 copies / mL). A negative result must be combined with  clinical observations, patient history, and epidemiological information. Fact Sheet for Patients:   StrictlyIdeas.no Fact Sheet for Healthcare Providers: BankingDealers.co.za This test is not yet approved or cleared  by the [place] FDA and has been authorized for detection and/or diagnosis of SARS-CoV-2 by FDA under an Emergency Use Authorization (EUA).  This EUA will remain in effect (meaning this test can be used) for the duration of the COVID-19 declaration under Section 564(b)(1) of the Act, 21 U.S.C. section 360bbb-3(b)(1), unless the authorization is terminated or revoked sooner. Performed at [facility], [address] [place], [place]  [address]      Labs: BNP (last 3 results) Recent Labs    07/30/19 1525  BNP 123XX123*   Basic Metabolic Panel: Recent Labs  Lab 07/30/19 1525 07/31/19 0549  NA 139 135  K 4.7 4.5  CL 108 106  CO2 22 20*  GLUCOSE 130* 202*  BUN 21 19  CREATININE 1.15 1.03  CALCIUM 9.0 8.6*   Liver Function Tests: No results for input(s): AST, ALT, ALKPHOS, BILITOT, PROT, ALBUMIN in the last 168 hours. No results for input(s): LIPASE, AMYLASE in the last 168 hours. No results for input(s): AMMONIA in the last 168 hours. CBC: Recent Labs  Lab 07/30/19 1525 07/31/19 0549  WBC 5.0 9.3  NEUTROABS 2.6  --   HGB 15.4 13.0  HCT 47.5 38.2*  MCV 97.1 92.0  PLT 145* 123*   Cardiac Enzymes: No results for input(s): CKTOTAL, CKMB, CKMBINDEX, TROPONINI in the last 168 hours. BNP: Invalid input(s): POCBNP CBG: Recent Labs  Lab 07/30/19 2057 07/31/19 0806  GLUCAP 209* 171*   D-Dimer No results for input(s): DDIMER in the last 72 hours. Hgb A1c Recent Labs    07/31/19 0549  HGBA1C 5.7*   Lipid Profile No results for input(s): CHOL, HDL, LDLCALC, TRIG, CHOLHDL, LDLDIRECT in the last 72 hours. Thyroid function studies No results for input(s): TSH, T4TOTAL, T3FREE, THYROIDAB in the last 72 hours.  Invalid input(s): FREET3 Anemia work up No results for input(s): VITAMINB12, FOLATE, FERRITIN, TIBC, IRON, RETICCTPCT in the last 72 hours. Urinalysis    Component Value Date/Time   COLORURINE STRAW (A) 04/15/2016 1802   APPEARANCEUR CLEAR (A) 04/15/2016 1802   LABSPEC 1.011 04/15/2016 1802   PHURINE 6.0 04/15/2016 1802   GLUCOSEU 50 (A) 04/15/2016 1802   HGBUR NEGATIVE 04/15/2016 1802   BILIRUBINUR NEGATIVE 04/15/2016 1802   KETONESUR 5 (A) 04/15/2016 1802   PROTEINUR 100 (A) 04/15/2016 1802   NITRITE NEGATIVE 04/15/2016 1802   LEUKOCYTESUR NEGATIVE 04/15/2016 1802   Sepsis Labs Invalid input(s): PROCALCITONIN,  WBC,  LACTICIDVEN Microbiology Recent Results (from the past 240  hour(s))  SARS Coronavirus 2 by RT PCR (hospital order, performed in [place] hospital lab) Nasopharyngeal Nasopharyngeal Swab     Status: None   Collection Time: 07/30/19  3:00 PM   Specimen: Nasopharyngeal Swab  Result Value Ref Range Status   SARS Coronavirus 2 NEGATIVE NEGATIVE Final    Comment: (NOTE) SARS-CoV-2 target nucleic acids are NOT DETECTED. The SARS-CoV-2 RNA is generally detectable in upper and lower respiratory specimens during the acute phase of infection. The lowest concentration of SARS-CoV-2 viral copies this assay can detect is 250 copies / mL. A negative result does not preclude SARS-CoV-2 infection and should not be used as the sole basis for treatment or other patient management decisions.  A negative result may occur with improper specimen collection / handling, submission of specimen other than nasopharyngeal swab, presence of  viral mutation(s) within the areas targeted by this assay, and inadequate number of viral copies (<250 copies / mL). A negative result must be combined with clinical observations, patient history, and epidemiological information. Fact Sheet for Patients:   StrictlyIdeas.no Fact Sheet for Healthcare Providers: BankingDealers.co.za This test is not yet approved or cleared  by the [place] FDA and has been authorized for detection and/or diagnosis of SARS-CoV-2 by FDA under an Emergency Use Authorization (EUA).  This EUA will remain in effect (meaning this test can be used) for the duration of the COVID-19 declaration under Section 564(b)(1) of the Act, 21 U.S.C. section 360bbb-3(b)(1), unless the authorization is terminated or revoked sooner. Performed at [facility], [address] [place], [place] [address]      Time coordinating discharge: <30 minutes  SIGNED:   Louellen Molder, MD  [facility] 07/31/2019, 11:24 AM Pager   If 7PM-7AM, please contact  night-coverage [email or web address] Password [email or web address]

## 2019-07-31 NOTE — Evaluation (Signed)
Physical Therapy Evaluation Patient Details Name: Daniel Frye MRN: [id number] DOB: 07/20/1950 Today's Date: 07/31/2019   History of Present Illness  Pt is a 69 year old male who presented with acute respiratory failure after rheumatoid arthritis infusion.  Pt does not have history of allergic reactions to renflexis.  Pt's PMH includes CHF, COPD, HTN, rheumatoid arthritis, depression, and diabetes.  Clinical Impression  Pt is a pleasant 69 year old male who was admitted for acute respiratory failure with hypoxia. Pt on 2L of O2 upon arrival, however once removed at 99% on RA.  Pt demonstrates all bed mobility/transfers/ambulation at baseline level with use of RW. He is requesting one as he doesn't like the rollater and feels safer using RW. All mobility performed on RA with sats at 93% with exertion. Good gait speed noted. Pt does not require any further PT needs at this time. Pt will be dc in house and does not require follow up. RN aware. Will dc current orders.     Follow Up Recommendations No PT follow up    Equipment Recommendations  Rolling walker with 5" wheels    Recommendations for Other Services       Precautions / Restrictions Precautions Precautions: Fall Restrictions Weight Bearing Restrictions: No Other Position/Activity Restrictions: monitor O2 stats with ambulation      Mobility  Bed Mobility Overal bed mobility: Modified Independent             General bed mobility comments: not performed as he was received seated at EOB at beginning/end of session  Transfers Overall transfer level: Needs assistance Equipment used: Rolling walker (2 wheeled) Transfers: Sit to/from Stand Sit to Stand: Min guard         General transfer comment: cueing for safety/sequencing with RW  Ambulation/Gait Ambulation/Gait assistance: Min guard Gait Distance (Feet): 200 Feet Assistive device: Rolling walker (2 wheeled) Gait Pattern/deviations: Step-through pattern Gait  velocity: 10' in 9 seconds   General Gait Details: ambulated with safe technique and upright posture. Good speed with no LOB noted.  Stairs            Wheelchair Mobility    Modified Rankin (Stroke Patients Only)       Balance Overall balance assessment: History of Falls;Needs assistance Sitting-balance support: Feet supported;No upper extremity supported Sitting balance-Leahy Scale: Good     Standing balance support: Bilateral upper extremity supported Standing balance-Leahy Scale: Good                               Pertinent Vitals/Pain Pain Assessment: No/denies pain    Home Living Family/patient expects to be discharged to:: Private residence Living Arrangements: Other relatives(sister and sister's daughter) Available Help at Discharge: Family;Available PRN/intermittently Type of Home: House Home Access: Stairs to enter Entrance Stairs-Rails: Can reach both Entrance Stairs-Number of Steps: 2 Home Layout: One level Home Equipment: Walker - 4 wheels;Crutches;Bedside commode;Shower seat Additional Comments: pt uses BSC over toilet, uses shower seat while showering    Prior Function Level of Independence: Needs assistance   Gait / Transfers Assistance Needed: Pt uses rollator for household mobility, receives physical assistance for mobility outside of the house with 4WW to access his backyard shed  ADL's / Homemaking Assistance Needed: Pt is mod I in ADLs, receives assistance for household IADLs. Pt is able to perform simple meal prep independently, reports difficulty with incontinence and medication management  Comments: reports multiple falls, mostly when using rollater.  Hand Dominance        Extremity/Trunk Assessment   Upper Extremity Assessment Upper Extremity Assessment: Overall WFL for tasks assessed    Lower Extremity Assessment Lower Extremity Assessment: Overall WFL for tasks assessed    Cervical / Trunk Assessment Cervical  / Trunk Assessment: Normal  Communication   Communication: No difficulties  Cognition Arousal/Alertness: Awake/alert Behavior During Therapy: WFL for tasks assessed/performed Overall Cognitive Status: Within Functional Limits for tasks assessed                                 General Comments: slightly impulsive and manic acting      General Comments General comments (skin integrity, edema, etc.): SpO2 = 95% at rest, dropped to 89% with activity on 2L [place].  SpO2 increased to 99% with rest and pursed lip breathing.    Exercises Other Exercises Other Exercises: provided education re: OT role and plan of care, fall and safety precautions, self care, functional mobility, energy conservation strategies, timed toileting to address urgency/incontinence, medication management   Assessment/Plan    PT Assessment Patent does not need any further PT services  PT Problem List         PT Treatment Interventions      PT Goals (Current goals can be found in the Care Plan section)  Acute Rehab PT Goals Patient Stated Goal: "to go home" PT Goal Formulation: With patient Time For Goal Achievement: 08/14/19 Potential to Achieve Goals: Good    Frequency     Barriers to discharge        Co-evaluation               AM-PAC PT "6 Clicks" Mobility  Outcome Measure Help needed turning from your back to your side while in a flat bed without using bedrails?: None Help needed moving from lying on your back to sitting on the side of a flat bed without using bedrails?: None Help needed moving to and from a bed to a chair (including a wheelchair)?: None Help needed standing up from a chair using your arms (e.g., wheelchair or bedside chair)?: None Help needed to walk in hospital room?: None Help needed climbing 3-5 steps with a railing? : A Little 6 Click Score: 23    End of Session Equipment Utilized During Treatment: Gait belt Activity Tolerance: Patient tolerated treatment  well Patient left: in bed;with bed alarm set Nurse Communication: Mobility status PT Visit Diagnosis: Muscle weakness (generalized) (M62.81)    Time: 1001-1018 PT Time Calculation (min) (ACUTE ONLY): 17 min   Charges:   PT Evaluation $PT Eval Low Complexity: 1 Low PT Treatments $Gait Training: 8-22 mins        Greggory Stallion, PT, DPT [phone]   Graciemae Delisle 07/31/2019, 1:22 PM

## 2019-07-31 NOTE — Progress Notes (Signed)
[name] to be D/C'd Home per MD order.  Discussed prescriptions and follow up appointments with the patient. Prescriptions electronically submitted, medication list explained in detail. Pt verbalized understanding.  Allergies as of 07/31/2019      Reactions   Renflexis [infliximab] Anaphylaxis      Medication List    TAKE these medications   albuterol 108 (90 Base) MCG/ACT inhaler Commonly known as: VENTOLIN HFA INHALE 2 PUFFS BY MOUTH EVERY 4 HOURS AS NEEDED FOR WHEEZING/ SHORTNESS OF BREATH   albuterol (2.5 MG/3ML) 0.083% nebulizer solution Commonly known as: PROVENTIL Take 3 mLs (2.5 mg total) by nebulization every 6 (six) hours as needed for wheezing or shortness of breath.   diphenhydrAMINE 25 MG tablet Commonly known as: BENADRYL Take 1 tablet (25 mg total) by mouth every 6 (six) hours as needed for itching.   famotidine 20 MG tablet Commonly known as: PEPCID Take 1 tablet (20 mg total) by mouth 2 (two) times daily for 5 days.   folic acid 1 MG tablet Commonly known as: FOLVITE Take 1 mg by mouth daily.   HYDROcodone-acetaminophen 5-325 MG tablet Commonly known as: NORCO/VICODIN Take 1 tablet by mouth daily as needed.   methotrexate 2.5 MG tablet Take 8 tablets by mouth once a week.   predniSONE 20 MG tablet Commonly known as: DELTASONE Take 2 tablets (40 mg total) by mouth daily with breakfast for 5 days. Start taking on: Aug 01, 2019 What changed:   medication strength  how much to take  when to take this       Vitals:   07/31/19 1203 07/31/19 1250  BP: (!) 167/93   Pulse: 76   Resp: 19   Temp: 97.7 F (36.5 C)   SpO2: 100% 99%    Skin clean, dry and intact without evidence of skin break down, no evidence of skin tears noted. IV catheter discontinued intact. Site without signs and symptoms of complications. Dressing and pressure applied. Pt denies pain at this time. No complaints noted.  An After Visit Summary was printed and given to the  patient. Patient waiting for ride to be discharged home  [place]

## 2019-07-31 NOTE — Evaluation (Signed)
Occupational Therapy Evaluation Patient Details Name: Daniel Frye MRN: [id number] DOB: 02-12-1951 Today's Date: 07/31/2019    History of Present Illness Pt is a 69 year old male who presented with acute respiratory failure after rheumatoid arthritis infusion.  Pt does not have history of allergic reactions to renflexis.  Pt's PMH includes CHF, COPD, HTN, rheumatoid arthritis, depression, and diabetes.   Clinical Impression   Mr. Heinl presents to OT with limited endurance, generalized weakness, and impaired balance that impact his ability to safely and independently complete functional tasks.  He lives with his sister and sister's daughter in a one level home.  Pt spends a lot of his time in bed, and he enjoys studying his Bible and spending time in his backyard workshop.  He uses a rollator for household mobility, and receives physical assistance from family to ambulate with 4WW in his backyard.  He is generally mod I in ADLs, and receives assistance for IADLs from family including cleaning and driving.  Pt is able to complete simple meal prep independently.  He reports difficulty with incontinence at baseline, and often wears a diaper to assist with urinary urgency.  OTR educated pt on timed toileting strategy to assist with incontinence/urinary urgency.  Pt also reports difficulty with managing his medications.  OTR provided education re: medication management strategies including pill organizer and visual cues/reminders.  Mr. Chann generally requires supervision assist for OOB mobility and ADLs with RW, including standing grooming, bathing, and toileting.  He requires min verbal cues for safety, sequencing, and utilizing energy conservation strategies.  Pt's SpO2 dropped to 89% with activity on 2L O2, but able to recover to 99% with rest and pursed lip breathing.  OTR educated pt on pursed lip breathing and energy conservation strategies.  Mr. Kwasniewski will continue to benefit from skilled OT services  in acute setting to address functional strengthening, endurance, and safety and independence in ADLs.  He will benefit from [facility] after discharge.    Follow Up Recommendations  Home health OT;Supervision - Intermittent    Equipment Recommendations  Other (comment)(2WW)    Recommendations for Other Services       Precautions / Restrictions Precautions Precautions: Fall Restrictions Weight Bearing Restrictions: No Other Position/Activity Restrictions: monitor O2 stats with ambulation      Mobility Bed Mobility Overal bed mobility: Modified Independent             General bed mobility comments: HOB elevated, increased effort  Transfers Overall transfer level: Needs assistance Equipment used: Rolling walker (2 wheeled) Transfers: Sit to/from Stand Sit to Stand: Min guard         General transfer comment: cueing for safety/sequencing with RW    Balance Overall balance assessment: Needs assistance Sitting-balance support: Feet supported;No upper extremity supported Sitting balance-Leahy Scale: Good     Standing balance support: Single extremity supported;During functional activity Standing balance-Leahy Scale: Good                             ADL either performed or assessed with clinical judgement   ADL Overall ADL's : Needs assistance/impaired Eating/Feeding: Modified independent;Sitting   Grooming: Wash/dry hands;Wash/dry face;Oral care;Standing;Min guard;Cueing for safety Grooming Details (indicate cue type and reason): with RW, cueing to conserve energy and monitor O2 stats         Upper Body Dressing : Independent;Sitting   Lower Body Dressing: Minimal assistance;Sit to/from stand Lower Body Dressing Details (indicate cue type and reason):  with RW Toilet Transfer: Supervision/safety;BSC;RW;Ambulation Armed forces technical officer Details (indicate cue type and reason): BSC over toilet         Functional mobility during ADLs:  Supervision/safety;Rolling walker;Cueing for safety General ADL Comments: Pt generally requires supervision for OOB mobility, including standing grooming, toileting, and bathing 2/2 decreased endurance and shortness of breath with ambulation.     Vision Baseline Vision/History: Wears glasses Wears Glasses: At all times Patient Visual Report: No change from baseline(reports his bifocal glasses do not work well at baseline, no acute changes)       Perception     Praxis      Pertinent Vitals/Pain Pain Assessment: No/denies pain     Hand Dominance     Extremity/Trunk Assessment Upper Extremity Assessment Upper Extremity Assessment: Overall WFL for tasks assessed   Lower Extremity Assessment Lower Extremity Assessment: Defer to PT evaluation   Cervical / Trunk Assessment Cervical / Trunk Assessment: Normal   Communication Communication Communication: No difficulties   Cognition Arousal/Alertness: Awake/alert Behavior During Therapy: WFL for tasks assessed/performed Overall Cognitive Status: Within Functional Limits for tasks assessed                                 General Comments: slightly decreased orientation, but able to determine date and recent events with extra time and min verbal prompting   General Comments  SpO2 = 95% at rest, dropped to 89% with activity on 2L [place].  SpO2 increased to 99% with rest and pursed lip breathing.    Exercises Other Exercises Other Exercises: provided education re: OT role and plan of care, fall and safety precautions, self care, functional mobility, energy conservation strategies, timed toileting to address urgency/incontinence, medication management   Shoulder Instructions      Home Living Family/patient expects to be discharged to:: Private residence Living Arrangements: Other relatives(sister and sister's daughter) Available Help at Discharge: Family;Available PRN/intermittently(pt's family works during the  day) Type of Home: House Home Access: Stairs to enter [employer] of Steps: 2 Entrance Stairs-Rails: Can reach both [place]: One level     Bathroom Shower/Tub: Teacher, early years/pre: [place]: Environmental consultant - 4 wheels;Crutches;Bedside commode;Shower seat   Additional Comments: pt uses BSC over toilet, uses shower seat while showering      Prior Functioning/Environment Level of Independence: Needs assistance  Gait / Transfers Assistance Needed: Pt uses rollator for household mobility, receives physical assistance for mobility outside of the house with 4WW to access his backyard shed ADL's / Homemaking Assistance Needed: Pt is mod I in ADLs, receives assistance for household IADLs. Pt is able to perform simple meal prep independently, reports difficulty with incontinence and medication management   Comments: Pt does not drive, reports no falls in last 12 months.  Pt spends a lot of time in his bed studying the Bible.        OT Problem List: Decreased strength;Decreased activity tolerance;Impaired balance (sitting and/or standing);Decreased safety awareness;Decreased knowledge of use of DME or AE;Decreased knowledge of precautions;Cardiopulmonary status limiting activity      OT Treatment/Interventions: Self-care/ADL training;Therapeutic exercise;Energy conservation;DME and/or AE instruction;Therapeutic activities;Patient/family education;Balance training    OT Goals(Current goals can be found in the care plan section) Acute Rehab OT Goals Patient Stated Goal: "to go home" OT Goal Formulation: With patient Time For Goal Achievement: 08/14/19 Potential to Achieve Goals: Good  OT Frequency: Min 1X/week   Barriers  to D/C:            Co-evaluation              AM-PAC OT "6 Clicks" Daily Activity     Outcome Measure Help from another person eating meals?: None Help from another person taking care of personal grooming?: None Help  from another person toileting, which includes using toliet, bedpan, or urinal?: A Little Help from another person bathing (including washing, rinsing, drying)?: A Little Help from another person to put on and taking off regular upper body clothing?: None Help from another person to put on and taking off regular lower body clothing?: A Little 6 Click Score: 21   End of Session Equipment Utilized During Treatment: Gait belt;Rolling walker  Activity Tolerance: Patient tolerated treatment well Patient left: in bed;with call bell/phone within reach;with bed alarm set;Other (comment)(with PT in room)  OT Visit Diagnosis: Other abnormalities of gait and mobility (R26.89);Muscle weakness (generalized) (M62.81)                Time: [id number] OT Time Calculation (min): 63 min Charges:  OT General Charges $OT Visit: 1 Visit OT Evaluation $OT Eval Moderate Complexity: 1 Mod OT Treatments $Self Care/Home Management : 38-52 mins  Myrtie Hawk Amberia Bayless, OTR/L 07/31/19, 10:48 AM

## 2019-07-31 NOTE — Discharge Instructions (Signed)
Allergies, Adult An allergy means that your body reacts to something that bothers it (allergen). It is not a normal reaction. This can happen from something that you:  Eat.  Breathe in.  Touch. You can have an allergy (be allergic) to:  Outdoor things, like: ? Pollen. ? Grass. ? Weeds.  Indoor things, like: ? Dust. ? Smoke. ? Pet dander.  Foods.  Medicines.  Things that bother your skin, like: ? Detergents. ? Chemicals. ? Latex.  Perfume.  Bugs. An allergy cannot spread from person to person (is not contagious). Follow these instructions at home:         Stay away from things that you know you are allergic to.  If you have allergies to things in the air, wash out your nose each day. Do it with one of these: ? A salt-water (saline) spray. ? A container (neti pot).  Take over-the-counter and prescription medicines only as told by your doctor.  Keep all follow-up visits as told by your doctor. This is important.  If you are at risk for a very bad allergy reaction (anaphylaxis), keep an auto-injector with you all the time. This is called an epinephrine injection. ? This is pre-measured medicine with a needle. You can put it into your skin by yourself. ? Right after you have a very bad allergy reaction, you or a person with you must give the medicine in less than a few minutes. This is an emergency.  If you have ever had a very bad allergy reaction, wear a medical alert bracelet or necklace. Your very bad allergy should be written on it. Contact a health care provider if:  Your symptoms do not get better with treatment. Get help right away if:  You have symptoms of a very bad allergy reaction. These include: ? A swollen mouth, tongue, or throat. ? Pain or tightness in your chest. ? Trouble breathing. ? Being short of breath. ? Dizziness. ? Fainting. ? Very bad pain in your belly (abdomen). ? Throwing up (vomiting). ? Watery poop  (diarrhea). Summary  An allergy means that your body reacts to something that bothers it (allergen). It is not a normal reaction.  Stay away from things that make your body react.  Take over-the-counter and prescription medicines only as told by your doctor.  If you are at risk for a very bad allergy reaction, carry an auto-injector (epinephrine injection) all the time. Also, wear a medical alert bracelet or necklace so people know about your allergy. This information is not intended to replace advice given to you by your health care provider. Make sure you discuss any questions you have with your health care provider. Document Revised: 06/12/2018 Document Reviewed: 06/06/2016 Elsevier Patient Education  2020 [employer] Inc[employer]  

## 2019-08-08 ENCOUNTER — Inpatient Hospital Stay: Payer: Medicare Other | Admitting: Family Medicine

## 2019-09-04 ENCOUNTER — Encounter: Payer: Self-pay | Admitting: Emergency Medicine

## 2019-09-04 ENCOUNTER — Emergency Department: Payer: Medicare Other

## 2019-09-04 ENCOUNTER — Emergency Department
Admission: EM | Admit: 2019-09-04 | Discharge: 2019-09-04 | Disposition: A | Discharge status: Home / Self Care | Payer: Medicare Other | Attending: Emergency Medicine | Admitting: Emergency Medicine

## 2019-09-04 ENCOUNTER — Ambulatory Visit: Payer: Self-pay | Admitting: *Deleted

## 2019-09-04 ENCOUNTER — Other Ambulatory Visit: Payer: Self-pay

## 2019-09-04 DIAGNOSIS — Z20822 Contact with and (suspected) exposure to covid-19: Secondary | ICD-10-CM | POA: Diagnosis not present

## 2019-09-04 DIAGNOSIS — J441 Chronic obstructive pulmonary disease with (acute) exacerbation: Secondary | ICD-10-CM | POA: Insufficient documentation

## 2019-09-04 DIAGNOSIS — E119 Type 2 diabetes mellitus without complications: Secondary | ICD-10-CM | POA: Insufficient documentation

## 2019-09-04 DIAGNOSIS — F17210 Nicotine dependence, cigarettes, uncomplicated: Secondary | ICD-10-CM | POA: Diagnosis not present

## 2019-09-04 DIAGNOSIS — F17290 Nicotine dependence, other tobacco product, uncomplicated: Secondary | ICD-10-CM | POA: Insufficient documentation

## 2019-09-04 DIAGNOSIS — R0602 Shortness of breath: Secondary | ICD-10-CM | POA: Diagnosis present

## 2019-09-04 DIAGNOSIS — I119 Hypertensive heart disease without heart failure: Secondary | ICD-10-CM | POA: Diagnosis not present

## 2019-09-04 LAB — URINALYSIS, COMPLETE (UACMP) WITH MICROSCOPIC
Bacteria, UA: NONE SEEN
Bilirubin Urine: NEGATIVE
Glucose, UA: NEGATIVE mg/dL
Hgb urine dipstick: NEGATIVE
Ketones, ur: NEGATIVE mg/dL
Leukocytes,Ua: NEGATIVE
Nitrite: NEGATIVE
Protein, ur: 30 mg/dL — AB
Specific Gravity, Urine: 1.023 (ref 1.005–1.030)
pH: 5 (ref 5.0–8.0)

## 2019-09-04 LAB — COMPREHENSIVE METABOLIC PANEL
ALT: 14 U/L (ref 0–44)
AST: 17 U/L (ref 15–41)
Albumin: 4 g/dL (ref 3.5–5.0)
Alkaline Phosphatase: 90 U/L (ref 38–126)
Anion gap: 9 (ref 5–15)
BUN: 20 mg/dL (ref 8–23)
CO2: 24 mmol/L (ref 22–32)
Calcium: 8.8 mg/dL — ABNORMAL LOW (ref 8.9–10.3)
Chloride: 106 mmol/L (ref 98–111)
Creatinine, Ser: 1.35 mg/dL — ABNORMAL HIGH (ref 0.61–1.24)
GFR calc Af Amer: >60 mL/min (ref >60)
GFR calc non Af Amer: 52 mL/min — ABNORMAL LOW (ref >60)
Glucose, Bld: 114 mg/dL — ABNORMAL HIGH (ref 70–99)
Potassium: 4.1 mmol/L (ref 3.5–5.1)
Sodium: 139 mmol/L (ref 135–145)
Total Bilirubin: 0.7 mg/dL (ref 0.3–1.2)
Total Protein: 7 g/dL (ref 6.5–8.1)

## 2019-09-04 LAB — SARS CORONAVIRUS 2 BY RT PCR (HOSPITAL ORDER, PERFORMED IN ~~LOC~~ HOSPITAL LAB): SARS Coronavirus 2: NEGATIVE

## 2019-09-04 LAB — TROPONIN I (HIGH SENSITIVITY)
Troponin I (High Sensitivity): 6 ng/L (ref <18)
Troponin I (High Sensitivity): 6 ng/L (ref <18)

## 2019-09-04 LAB — BRAIN NATRIURETIC PEPTIDE: B Natriuretic Peptide: 35.2 pg/mL (ref 0.0–100.0)

## 2019-09-04 MED ORDER — AZITHROMYCIN 250 MG PO TABS
ORAL_TABLET | ORAL | 0 refills | Status: AC
Start: 2019-09-04 — End: 2019-09-09

## 2019-09-04 MED ORDER — ALBUTEROL SULFATE (2.5 MG/3ML) 0.083% IN NEBU: 2.5000 mg | INHALATION_SOLUTION | Freq: Four times a day (QID) | RESPIRATORY_TRACT | 12 refills | Status: DC | PRN

## 2019-09-04 MED ORDER — IPRATROPIUM-ALBUTEROL 0.5-2.5 (3) MG/3ML IN SOLN
3.0000 mL | Freq: Once | RESPIRATORY_TRACT | Status: AC
  Administered 2019-09-04: 3 mL via RESPIRATORY_TRACT
  Filled 2019-09-04: qty 3

## 2019-09-04 MED ORDER — AZITHROMYCIN 500 MG PO TABS
500.0000 mg | ORAL_TABLET | Freq: Once | ORAL | Status: AC
  Administered 2019-09-04: 500 mg via ORAL
  Filled 2019-09-04: qty 1

## 2019-09-04 MED ORDER — PREDNISONE 20 MG PO TABS
60.0000 mg | ORAL_TABLET | Freq: Once | ORAL | Status: AC
  Administered 2019-09-04: 60 mg via ORAL
  Filled 2019-09-04: qty 3

## 2019-09-04 MED ORDER — PREDNISONE 20 MG PO TABS: 20.0000 mg | ORAL_TABLET | Freq: Every day | ORAL | 0 refills | Status: DC

## 2019-09-04 MED ORDER — METHYLPREDNISOLONE SODIUM SUCC 125 MG IJ SOLR: 125.0000 mg | Freq: Once | INTRAMUSCULAR | Status: DC

## 2019-09-04 NOTE — Telephone Encounter (Signed)
C/o SOB this am. Patient tried to take Nebulizer treatment and was unsuccessful to treat SOB. Requesting to see Dr. Ronnald Ramp today. Sitting EOB per patient and only able to take 3 steps to bathroom and becoming SOB. Instructed patient to go to ED. Patient reports no one home to take him. Instructed patient to call 911. Asked patient if triager should call 911 for him, patient reports he will call. Patient able to talk in sentences and describe past experiences with SOB episodes. Care advise given. Patient verbalized understanding of care advise.   Reason for Disposition . [1] MODERATE difficulty breathing (e.g., speaks in phrases, SOB even at rest, pulse 100-120) AND [2] NEW-onset or WORSE than normal  Answer Assessment - Initial Assessment Questions 1. RESPIRATORY STATUS: "Describe your breathing?" (e.g., wheezing, shortness of breath, unable to speak, severe coughing)      Short of breathing while talking, or movement 2. ONSET: "When did this breathing problem begin?"      This am 3. PATTERN "Does the difficult breathing come and go, or has it been constant since it started?"      Now constant attempted to take nebulizer treatment and did not help with symptoms 4. SEVERITY: "How bad is your breathing?" (e.g., mild, moderate, severe)    - MILD: No SOB at rest, mild SOB with walking, speaks normally in sentences, can lay down, no retractions, pulse < 100.    - MODERATE: SOB at rest, SOB with minimal exertion and prefers to sit, cannot lie down flat, speaks in phrases, mild retractions, audible wheezing, pulse 100-120.    - SEVERE: Very SOB at rest, speaks in single words, struggling to breathe, sitting hunched forward, retractions, pulse > 120     Sitting EOB helps him breath SOB with walking 3 steps to bathroom from bed 5. RECURRENT SYMPTOM: "Have you had difficulty breathing before?" If Yes, ask: "When was the last time?" and "What happened that time?"      Yes. At dr office  6. CARDIAC HISTORY:  "Do you have any history of heart disease?" (e.g., heart attack, angina, bypass surgery, angioplasty)      n/a 7. LUNG HISTORY: "Do you have any history of lung disease?"  (e.g., pulmonary embolus, asthma, emphysema)     Hx. COPD 8. CAUSE: "What do you think is causing the breathing problem?"      Questions fluid in abdomen  Protocols used: BREATHING DIFFICULTY-A-AH

## 2019-09-04 NOTE — ED Notes (Signed)
Pt ambulated by this RN. Pt noted to be dyspneic while walking, when patient arrived back in bed with states "I need to get my air back in my lungs". Pt states he is breathing better than he was but is not back to baseline. O2 sats 94% on RA while ambulating and 99% upon arrival back to bedside. EDP Malinda aware and to bedside to assess patient.

## 2019-09-04 NOTE — ED Provider Notes (Signed)
[facility] Emergency Department Provider Note   ____________________________________________   First MD Initiated Contact with Patient 09/04/19 1555     (approximate)  I have reviewed the triage vital signs and the nursing notes.   HISTORY  Chief Complaint Shortness of Breath    HPI Daniel Frye is a 69 y.o. male who complains of several days of shortness of breath and cough productive of white phlegm.  He says he stopped taking his Lasix because it was making him pee every 10 minutes and he could not stand it.  His BNP and chest x-ray are within normal limits.  He has a little bit of edema in his legs but this is normal for him and he says he has been that way for about 10 or 15 years.  He is not running a fever.  He is not having any chest pain.  He does smoke tobacco and marijuana.  No chest pain with exertion or with breathing.  He does get short of breath after walking about 10 feet he says.  He can lie flat without getting short of breath.         Past Medical History:  Diagnosis Date  . Alcohol abuse   . CHF (congestive heart failure) ([place])   . Cocaine use   . COPD (chronic obstructive pulmonary disease) ([place])   . Depression   . Diabetes mellitus without complication ([place])   . Hypertension   . Marijuana use   . 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 Rheumatoid arthritis ([place])   . Substance abuse ([place])   . Tobacco use     Patient Active Problem List   Diagnosis Date Noted  . Acute anaphylaxis 07/31/2019  . Alcohol abuse, daily use 07/31/2019  . Acute respiratory failure with hypoxia ([place]) 07/30/2019  . Aortic atherosclerosis ([place]) 05/15/2018  . Hypokalemia 03/03/2016  . Hypomagnesemia 03/03/2016  . Substance induced mood disorder ([place]) 03/02/2016  . Alcohol withdrawal ([place]) 02/29/2016  . Crack cocaine use 01/22/2016  . Obstructive sleep apnea of adult 03/30/2015  . SOB (shortness of breath) on  exertion 03/30/2015  . Edema of foot 03/30/2015  . Chronic diastolic CHF (congestive heart failure), NYHA class 2 ([place]) 03/30/2015  . Alcohol abuse 03/23/2015  . Noncompliance 03/23/2015  . Vitamin D deficiency 01/27/2015  . Diabetes mellitus type 2, controlled, without complications ([place]) 17/00/1749  . Obesity (BMI 30.0-34.9) 11/26/2014  . COPD (chronic obstructive pulmonary disease) ([place]) 06/22/2014  . Essential (primary) hypertension 06/22/2014  . Rheumatoid arthritis ([place]) 06/22/2014  . Tobacco abuse 06/22/2014  . Chronic obstructive pulmonary disease with acute exacerbation ([place]) 06/22/2014    Past Surgical History:  Procedure Laterality Date  . heart surgery to drain fluid N/A 1987   Heart surgery to remove fluid     Prior to Admission medications   Medication Sig Start Date End Date Taking? Authorizing Provider  albuterol (PROVENTIL HFA;VENTOLIN HFA) 108 (90 Base) MCG/ACT inhaler INHALE 2 PUFFS BY MOUTH EVERY 4 HOURS AS NEEDED FOR WHEEZING/ SHORTNESS OF BREATH 05/15/18   Juline Patch, MD  albuterol (PROVENTIL) (2.5 MG/3ML) 0.083% nebulizer solution Take 3 mLs (2.5 mg total) by nebulization every 6 (six) hours as needed for wheezing or shortness of breath. 05/15/18   Juline Patch, MD  albuterol (PROVENTIL) (2.5 MG/3ML) 0.083% nebulizer solution Take 3 mLs (2.5 mg total) by nebulization every 6 (six) hours as needed for wheezing or shortness of breath.  09/04/19   Nena Polio, MD  azithromycin (ZITHROMAX Z-PAK) 250 MG tablet Take 2 tablets (500 mg) on  Day 1,  followed by 1 tablet (250 mg) once daily on Days 2 through 5. 09/04/19 09/09/19  Nena Polio, MD  diphenhydrAMINE (BENADRYL) 25 MG tablet Take 1 tablet (25 mg total) by mouth every 6 (six) hours as needed for itching. 07/31/19   Dhungel, Flonnie Overman, MD  famotidine (PEPCID) 20 MG tablet Take 1 tablet (20 mg total) by mouth 2 (two) times daily for 5 days. 07/31/19 08/05/19  Dhungel, Flonnie Overman, MD  folic acid (FOLVITE) 1 MG tablet  Take 1 mg by mouth daily. 07/09/19   [provider]  HYDROcodone-acetaminophen (NORCO/VICODIN) 5-325 MG tablet Take 1 tablet by mouth daily as needed. 07/15/19   [provider]  methotrexate 2.5 MG tablet Take 8 tablets by mouth once a week. 06/23/19   [provider]  predniSONE (DELTASONE) 20 MG tablet Take 1 tablet (20 mg total) by mouth daily. 09/04/19 09/03/20  Nena Polio, MD    Allergies Renflexis [infliximab]  Family History  Problem Relation Age of Onset  . COPD Brother   . Arthritis Brother   . Diabetes Brother   . Hypertension Brother     Social History Social History   Tobacco Use  . Smoking status: Current Every Day Smoker    Packs/day: 0.25    Years: 51.00    Pack years: 12.75    Types: Cigarettes, Cigars  . Smokeless tobacco: Never Used  Vaping Use  . Vaping Use: Never used  Substance Use Topics  . Alcohol use: Yes    Alcohol/week: 61.0 standard drinks    Types: 12 Cans of beer, 49 Standard drinks or equivalent per week    Comment: Drinks 1/5 day of wine   . Drug use: Yes    Types: "Crack" cocaine, Marijuana    Comment: Uses only once in awhile and does not look for it but has hard time when it is there tunrning it down.     Review of Systems  Constitutional: No fever/chills Eyes: No visual changes. ENT: No sore throat. Cardiovascular: Denies chest pain. Respiratory:  shortness of breath. Gastrointestinal: No abdominal pain.  No nausea, no vomiting.  No diarrhea.  No constipation. Genitourinary: Negative for dysuria. Musculoskeletal: Negative for back pain. Skin: Negative for rash. Neurological: Negative for headaches, focal weakness  ____________________________________________   PHYSICAL EXAM:  VITAL SIGNS: ED Triage Vitals  Enc Vitals Group     BP 09/04/19 1323 (!) 151/87     Pulse Rate 09/04/19 1323 67     Resp --      Temp 09/04/19 1323 98.7 F (37.1 C)     Temp Source 09/04/19 1323 Oral     SpO2 09/04/19  1323 98 %     Weight 09/04/19 1324 180 lb (81.6 kg)     Height 09/04/19 1324 5\' 10"  (1.778 m)     Head Circumference --      Peak Flow --      Pain Score 09/04/19 1324 0     Pain Loc --      Pain Edu? --      Excl. in [place]? --     Constitutional: Alert and oriented. Well appearing and in no acute distress. Eyes: Conjunctivae are normal. . Head: Atraumatic. Nose: No congestion/rhinnorhea. Mouth/Throat: Mucous membranes are moist.  Oropharynx non-erythematous. Neck: No stridor. Cardiovascular: Normal rate, regular rhythm. Grossly normal heart sounds.  Good peripheral circulation. Respiratory: Normal respiratory effort.  No retractions. Lungs CTAB. Gastrointestinal: Soft and nontender. No distention. No abdominal bruits. No CVA tenderness. Musculoskeletal: No lower extremity tenderness trace edema.   Neurologic:  Normal speech and language. No gross focal neurologic deficits are appreciated. No gait instability. Skin:  Skin is warm, dry and intact. No rash noted.   ____________________________________________   LABS (all labs ordered are listed, but only abnormal results are displayed)  Labs Reviewed  COMPREHENSIVE METABOLIC PANEL - Abnormal; Notable for the following components:      Result Value   Glucose, Bld 114 (*)    Creatinine, Ser 1.35 (*)    Calcium 8.8 (*)    GFR calc non Af Amer 52 (*)    All other components within normal limits  URINALYSIS, COMPLETE (UACMP) WITH MICROSCOPIC - Abnormal; Notable for the following components:   Color, Urine YELLOW (*)    APPearance HAZY (*)    Protein, ur 30 (*)    All other components within normal limits  SARS CORONAVIRUS 2 BY RT PCR (HOSPITAL ORDER, [facility])  BRAIN NATRIURETIC PEPTIDE  TROPONIN I (HIGH SENSITIVITY)  TROPONIN I (HIGH SENSITIVITY)   ____________________________________________  EKG  EKG read interpreted by me shows normal sinus rhythm rate of 77 left axis no acute ST-T changes  ____________________________________________  RADIOLOGY  ED MD interpretation: Chest x-ray read by radiology reviewed by me is normal.  Official radiology report(s): DG Chest Port 1 View  Result Date: 09/04/2019 CLINICAL DATA:  Shortness of breath increasing over 2 weeks EXAM: PORTABLE CHEST 1 VIEW COMPARISON:  07/30/2019 FINDINGS: Cardiac shadow is stable. Lungs are clear. No focal infiltrate is noted. No acute bony abnormality is noted. IMPRESSION: No acute abnormality seen. Electronically Signed   By: Inez Catalina M.D.   On: 09/04/2019 13:52    ____________________________________________   PROCEDURES  Procedure(s) performed (including Critical Care):  Procedures   ____________________________________________   INITIAL IMPRESSION / ASSESSMENT AND PLAN / ED COURSE  After 3 nebs and Solu-Medrol patient is able to walk about a half a block without desaturating.  He does become dyspneic but much less than he usually has in the last few weeks.  He wants to try going home and seeing if he can get better at home with antibiotics prednisone and nebulizer.  He will return immediately if he is worse or if he fails to get better.  I believe he can try this he is improved a good deal.              ____________________________________________   FINAL CLINICAL IMPRESSION(S) / ED DIAGNOSES  Final diagnoses:  COPD exacerbation [facility])     ED Discharge Orders         Ordered    azithromycin (ZITHROMAX Z-PAK) 250 MG tablet     Discontinue  Reprint     09/04/19 1824    predniSONE (DELTASONE) 20 MG tablet  Daily     Discontinue  Reprint     09/04/19 1824    albuterol (PROVENTIL) (2.5 MG/3ML) 0.083% nebulizer solution  Every 6 hours PRN     Discontinue  Reprint     09/04/19 1824           Note:  This document was prepared using Dragon voice recognition software and may include unintentional dictation errors.    Nena Polio, MD 09/04/19 [phone]

## 2019-09-04 NOTE — ED Notes (Signed)
This tech in to draw second troponin level on pt and pt advised this Probation officer that he would rather the RN draw more blood from his IV placed in his left forearm. RN Mateo Flow notified.

## 2019-09-04 NOTE — Telephone Encounter (Signed)
I called the pt to see if he called the ambulance- he stated no because he thought he was ok, but getting worse. I explained to pt I was calling ambulance for him and he is in agreement. I placed the call to 911 and they are sending someone there

## 2019-09-04 NOTE — ED Notes (Signed)
Pt ambulatory to the lobby with this RN. Pt refused wheelchair to the lobby at this time. Pt states understanding of D/C instructions and return precautions. Pt awaiting sister arrival outside at this time.

## 2019-09-04 NOTE — Discharge Instructions (Addendum)
Please take the prednisone 1 pill a day for the next 4 days.  Start tomorrow because I gave you a dose here in the emergency room.  Zithromax the same thing start the Zithromax tomorrow because I gave you a dose here in emergency room.  Use your albuterol nebulization up to every 4 hours if need be.  If you have to use it every 4 hours more than 2 or 3 times please return here.  Also please return for increasing shortness of breath fever chest pain or any other worsening.  Please follow-up with your regular doctor in the next few days.  It may be useful to get a referral to pulmonology.  If you do not have a ride and you get short of breath do not hesitate to call 911.

## 2019-09-04 NOTE — ED Notes (Signed)
Pt placed on cardiac monitor by this RN. Upon arrival to bedside pt noted to be eating without difficulty. Expiratory wheezes can be heard without stethoscope. Pt states feels much better since he arrived to room. NSR on the monitor at this time.

## 2019-09-04 NOTE — ED Triage Notes (Addendum)
Pt from home via [facility]. Pt st [place] for 2 weeks- coughing up white mucus for 4 days. Denies recent illness. Denies CP. Expiratory wheezing upper lung sounds. Hx COPD/HF St not taking lasix as prescribed due to "I get up to the bathroom too many times".   NAD at this time. Pt sating at 100% RA.

## 2019-09-04 NOTE — ED Triage Notes (Signed)
From home via [facility]. SOB worsening x2 weeks. Coughing up some white mucus. Some relief with breathing treatments but it doesn't last.  Given albuterol, 125 solumedrol by EMS  18 L forearm

## 2019-09-25 ENCOUNTER — Other Ambulatory Visit: Payer: Self-pay | Admitting: Family Medicine

## 2019-09-25 DIAGNOSIS — J441 Chronic obstructive pulmonary disease with (acute) exacerbation: Secondary | ICD-10-CM

## 2019-09-25 MED ORDER — ALBUTEROL SULFATE HFA 108 (90 BASE) MCG/ACT IN AERS: INHALATION_SPRAY | RESPIRATORY_TRACT | 0 refills | Status: DC

## 2019-09-25 NOTE — Telephone Encounter (Signed)
Courtesy refill given. Patient reports he does not have any inhaler medication at this time. Made appt 10/02/19.

## 2019-09-25 NOTE — Telephone Encounter (Signed)
Medication Refill - Medication: albuterol   Has the patient contacted their pharmacy? Yes.   Pt states that he is out of this inhaler. Please advise . (Agent: If no, request that the patient contact the pharmacy for the refill.) (Agent: If yes, when and what did the pharmacy advise?)  Preferred Pharmacy (with phone number or street name):  [place], [place] - [address]  [place] [address]  Phone: [phone] Fax: [phone]  Hours: Not open 24 hours     Agent: Please be advised that RX refills may take up to 3 business days. We ask that you follow-up with your pharmacy.

## 2019-10-02 ENCOUNTER — Other Ambulatory Visit: Payer: Self-pay

## 2019-10-02 ENCOUNTER — Encounter: Payer: Self-pay | Admitting: Family Medicine

## 2019-10-02 ENCOUNTER — Ambulatory Visit (INDEPENDENT_AMBULATORY_CARE_PROVIDER_SITE_OTHER): Payer: Medicare Other | Admitting: Family Medicine

## 2019-10-02 VITALS — BP 110/62 | HR 76 | Ht 70.0 in | Wt 177.0 lb

## 2019-10-02 DIAGNOSIS — Z1211 Encounter for screening for malignant neoplasm of colon: Secondary | ICD-10-CM

## 2019-10-02 DIAGNOSIS — J441 Chronic obstructive pulmonary disease with (acute) exacerbation: Secondary | ICD-10-CM

## 2019-10-02 NOTE — Progress Notes (Signed)
Date:  10/02/2019   Name:  Daniel Frye   DOB:  1950/08/15   MRN:  [id number]   Chief Complaint: COPD (albuterol inhaler and nebulizer solution) and Depression  COPD He complains of wheezing. There is no chest tightness, cough, difficulty breathing, frequent throat clearing, hemoptysis, hoarse voice, shortness of breath or sputum production. This is a chronic problem. The current episode started more than 1 year ago. The problem has been waxing and waning. Pertinent negatives include no chest pain, dyspnea on exertion, ear pain, fever, headaches, myalgias, postnasal drip, sneezing or sore throat. His past medical history is significant for COPD.  Depression        Associated symptoms include no myalgias, no headaches and no suicidal ideas.   Lab Results  Component Value Date   CREATININE 1.35 (H) 09/04/2019   BUN 20 09/04/2019   NA 139 09/04/2019   K 4.1 09/04/2019   CL 106 09/04/2019   CO2 24 09/04/2019   No results found for: CHOL, HDL, LDLCALC, LDLDIRECT, TRIG, CHOLHDL Lab Results  Component Value Date   TSH 0.633 11/26/2014   Lab Results  Component Value Date   HGBA1C 5.7 (H) 07/31/2019   Lab Results  Component Value Date   WBC 9.3 07/31/2019   HGB 13.0 07/31/2019   HCT 38.2 (L) 07/31/2019   MCV 92.0 07/31/2019   PLT 123 (L) 07/31/2019   Lab Results  Component Value Date   ALT 14 09/04/2019   AST 17 09/04/2019   ALKPHOS 90 09/04/2019   BILITOT 0.7 09/04/2019     Review of Systems  Constitutional: Negative for chills and fever.  HENT: Negative for drooling, ear discharge, ear pain, hoarse voice, postnasal drip, sneezing and sore throat.   Respiratory: Positive for wheezing. Negative for cough, hemoptysis, sputum production and shortness of breath.   Cardiovascular: Negative for chest pain, dyspnea on exertion, palpitations and leg swelling.  Gastrointestinal: Negative for abdominal pain, blood in stool, constipation, diarrhea and nausea.  Endocrine:  Negative for polydipsia.  Genitourinary: Negative for dysuria, frequency, hematuria and urgency.  Musculoskeletal: Negative for back pain, myalgias and neck pain.  Skin: Negative for rash.  Allergic/Immunologic: Negative for environmental allergies.  Neurological: Negative for dizziness and headaches.  Hematological: Does not bruise/bleed easily.  Psychiatric/Behavioral: Positive for depression. Negative for suicidal ideas. The patient is not nervous/anxious.     Patient Active Problem List   Diagnosis Date Noted  . Acute anaphylaxis 07/31/2019  . Alcohol abuse, daily use 07/31/2019  . Acute respiratory failure with hypoxia ([place]) 07/30/2019  . Aortic atherosclerosis ([place]) 05/15/2018  . Hypokalemia 03/03/2016  . Hypomagnesemia 03/03/2016  . Substance induced mood disorder ([place]) 03/02/2016  . Alcohol withdrawal ([place]) 02/29/2016  . Crack cocaine use 01/22/2016  . Obstructive sleep apnea of adult 03/30/2015  . SOB (shortness of breath) on exertion 03/30/2015  . Edema of foot 03/30/2015  . Chronic diastolic CHF (congestive heart failure), NYHA class 2 ([place]) 03/30/2015  . Alcohol abuse 03/23/2015  . Noncompliance 03/23/2015  . Vitamin D deficiency 01/27/2015  . Diabetes mellitus type 2, controlled, without complications ([place]) 36/64/4034  . Obesity (BMI 30.0-34.9) 11/26/2014  . COPD (chronic obstructive pulmonary disease) ([place]) 06/22/2014  . Essential (primary) hypertension 06/22/2014  . Rheumatoid arthritis ([place]) 06/22/2014  . Tobacco abuse 06/22/2014  . Chronic obstructive pulmonary disease with acute exacerbation ([place]) 06/22/2014    Allergies  Allergen Reactions  . Renflexis [Infliximab] Anaphylaxis    Past Surgical History:  Procedure Laterality Date  .  heart surgery to drain fluid N/A 1987   Heart surgery to remove fluid     Social History   Tobacco Use  . Smoking status: Current Every Day Smoker    Packs/day: 0.25    Years: 51.00    Pack years: 12.75    Types:  Cigarettes, Cigars  . Smokeless tobacco: Never Used  Vaping Use  . Vaping Use: Never used  Substance Use Topics  . Alcohol use: Yes    Alcohol/week: 61.0 standard drinks    Types: 12 Cans of beer, 49 Standard drinks or equivalent per week    Comment: Drinks 1/5 day of wine   . Drug use: Yes    Types: "Crack" cocaine, Marijuana    Comment: Uses only once in awhile and does not look for it but has hard time when it is there tunrning it down.      Medication list has been reviewed and updated.  Current Meds  Medication Sig  . albuterol (PROVENTIL) (2.5 MG/3ML) 0.083% nebulizer solution Take 3 mLs (2.5 mg total) by nebulization every 6 (six) hours as needed for wheezing or shortness of breath.  Marland Kitchen albuterol (VENTOLIN HFA) 108 (90 Base) MCG/ACT inhaler INHALE 2 PUFFS BY MOUTH EVERY 4 HOURS AS NEEDED FOR WHEEZING/ SHORTNESS OF BREATH  . folic acid (FOLVITE) 1 MG tablet Take 1 mg by mouth daily.  Marland Kitchen HYDROcodone-acetaminophen (NORCO/VICODIN) 5-325 MG tablet Take 1 tablet by mouth daily as needed.  . methotrexate 2.5 MG tablet Take 8 tablets by mouth once a week.  . [DISCONTINUED] albuterol (PROVENTIL) (2.5 MG/3ML) 0.083% nebulizer solution Take 3 mLs (2.5 mg total) by nebulization every 6 (six) hours as needed for wheezing or shortness of breath.  . [DISCONTINUED] famotidine (PEPCID) 20 MG tablet Take 1 tablet (20 mg total) by mouth 2 (two) times daily for 5 days.  . [DISCONTINUED] predniSONE (DELTASONE) 20 MG tablet Take 1 tablet (20 mg total) by mouth daily.    PHQ 2/9 Scores 10/02/2019 12/05/2017 09/13/2017 09/06/2017  PHQ - 2 Score 2 0 1 2  PHQ- 9 Score 2 0 - 13    GAD 7 : Generalized Anxiety Score 10/02/2019  Nervous, Anxious, on Edge 0  Control/stop worrying 0  Worry too much - different things 0  Trouble relaxing 0  Restless 0  Easily annoyed or irritable 0  Afraid - awful might happen 0  Total GAD 7 Score 0    BP Readings from Last 3 Encounters:  10/02/19 (!) 110/62  09/04/19  (!) 179/98  07/31/19 (!) 167/93    Physical Exam Vitals reviewed.  HENT:     Head: Normocephalic.     Right Ear: Tympanic membrane, ear canal and external ear normal.     Left Ear: Tympanic membrane, ear canal and external ear normal.     Nose: Nose normal. No congestion or rhinorrhea.  Eyes:     General: No scleral icterus.       Right eye: No discharge.        Left eye: No discharge.     Conjunctiva/sclera: Conjunctivae normal.     Pupils: Pupils are equal, round, and reactive to light.  Neck:     Thyroid: No thyromegaly.     Vascular: No JVD.     Trachea: No tracheal deviation.  Cardiovascular:     Rate and Rhythm: Normal rate and regular rhythm.     Heart sounds: Normal heart sounds. No murmur heard.  No friction rub. No gallop.  Pulmonary:     Effort: No respiratory distress.     Breath sounds: Normal breath sounds. No wheezing, rhonchi or rales.  Abdominal:     General: Bowel sounds are normal.     Palpations: Abdomen is soft. There is no mass.     Tenderness: There is no abdominal tenderness. There is no guarding or rebound.  Musculoskeletal:        General: No tenderness. Normal range of motion.     Cervical back: Normal range of motion and neck supple.  Lymphadenopathy:     Cervical: No cervical adenopathy.  Skin:    General: Skin is warm.     Findings: No rash.  Neurological:     Mental Status: He is alert and oriented to person, place, and time.     Cranial Nerves: No cranial nerve deficit.     Deep Tendon Reflexes: Reflexes are normal and symmetric.     Wt Readings from Last 3 Encounters:  10/02/19 177 lb (80.3 kg)  09/04/19 180 lb (81.6 kg)  07/31/19 181 lb 11.2 oz (82.4 kg)    BP (!) 110/62   Pulse 76   Ht 5\' 10"  (1.778 m)   Wt 177 lb (80.3 kg)   BMI 25.40 kg/m   Assessment and Plan:  1. Colon cancer screening Discussed with patient and patient agrees to FIT. - Fecal occult blood, imunochemical([facility])  2. Chronic obstructive  pulmonary disease with acute exacerbation [facility]) Patient states that he needs a new machine and his bronchodilators refilled.  Patient was given instructions on purchasing a nebulizer for his albuterol nebulizing solution.  However I am wondering if given his sleep apnea patient was looking to get a new CPAP machine and we will discuss this with patient later this week.  In the meantime patient will also have refills on his Ventolin inhaler for his COPD flares.

## 2019-10-09 ENCOUNTER — Emergency Department: Payer: Medicare Other

## 2019-10-09 ENCOUNTER — Emergency Department
Admission: EM | Admit: 2019-10-09 | Discharge: 2019-10-09 | Disposition: A | Discharge status: Home / Self Care | Payer: Medicare Other | Attending: Emergency Medicine | Admitting: Emergency Medicine

## 2019-10-09 ENCOUNTER — Other Ambulatory Visit: Payer: Self-pay

## 2019-10-09 DIAGNOSIS — Z7951 Long term (current) use of inhaled steroids: Secondary | ICD-10-CM | POA: Diagnosis not present

## 2019-10-09 DIAGNOSIS — R0602 Shortness of breath: Secondary | ICD-10-CM | POA: Diagnosis present

## 2019-10-09 DIAGNOSIS — I110 Hypertensive heart disease with heart failure: Secondary | ICD-10-CM | POA: Insufficient documentation

## 2019-10-09 DIAGNOSIS — F17210 Nicotine dependence, cigarettes, uncomplicated: Secondary | ICD-10-CM | POA: Insufficient documentation

## 2019-10-09 DIAGNOSIS — E119 Type 2 diabetes mellitus without complications: Secondary | ICD-10-CM | POA: Insufficient documentation

## 2019-10-09 DIAGNOSIS — J441 Chronic obstructive pulmonary disease with (acute) exacerbation: Secondary | ICD-10-CM | POA: Diagnosis not present

## 2019-10-09 DIAGNOSIS — I5032 Chronic diastolic (congestive) heart failure: Secondary | ICD-10-CM | POA: Insufficient documentation

## 2019-10-09 LAB — BASIC METABOLIC PANEL
Anion gap: 12 (ref 5–15)
BUN: 20 mg/dL (ref 8–23)
CO2: 21 mmol/L — ABNORMAL LOW (ref 22–32)
Calcium: 8.9 mg/dL (ref 8.9–10.3)
Chloride: 106 mmol/L (ref 98–111)
Creatinine, Ser: 1.07 mg/dL (ref 0.61–1.24)
GFR calc Af Amer: >60 mL/min (ref >60)
GFR calc non Af Amer: >60 mL/min (ref >60)
Glucose, Bld: 106 mg/dL — ABNORMAL HIGH (ref 70–99)
Potassium: 3.7 mmol/L (ref 3.5–5.1)
Sodium: 139 mmol/L (ref 135–145)

## 2019-10-09 LAB — CBC
HCT: 45.3 % (ref 39.0–52.0)
Hemoglobin: 15 g/dL (ref 13.0–17.0)
MCH: 31.7 pg (ref 26.0–34.0)
MCHC: 33.1 g/dL (ref 30.0–36.0)
MCV: 95.8 fL (ref 80.0–100.0)
Platelets: 166 10*3/uL (ref 150–400)
RBC: 4.73 MIL/uL (ref 4.22–5.81)
RDW: 14.1 % (ref 11.5–15.5)
WBC: 7.3 10*3/uL (ref 4.0–10.5)
nRBC: 0 % (ref 0.0–0.2)

## 2019-10-09 LAB — TROPONIN I (HIGH SENSITIVITY): Troponin I (High Sensitivity): 11 ng/L (ref <18)

## 2019-10-09 MED ORDER — PREDNISONE 20 MG PO TABS
60.0000 mg | ORAL_TABLET | Freq: Once | ORAL | Status: AC
  Administered 2019-10-09: 60 mg via ORAL
  Filled 2019-10-09: qty 3

## 2019-10-09 MED ORDER — ALBUTEROL SULFATE (2.5 MG/3ML) 0.083% IN NEBU
2.5000 mg | INHALATION_SOLUTION | Freq: Once | RESPIRATORY_TRACT | Status: AC
  Administered 2019-10-09: 2.5 mg via RESPIRATORY_TRACT
  Filled 2019-10-09: qty 3

## 2019-10-09 MED ORDER — PREDNISONE 20 MG PO TABS: 60.0000 mg | ORAL_TABLET | Freq: Every day | ORAL | 0 refills | Status: AC

## 2019-10-09 MED ORDER — IPRATROPIUM-ALBUTEROL 0.5-2.5 (3) MG/3ML IN SOLN
9.0000 mL | Freq: Once | RESPIRATORY_TRACT | Status: AC
  Administered 2019-10-09: 9 mL via RESPIRATORY_TRACT
  Filled 2019-10-09: qty 3

## 2019-10-09 NOTE — ED Triage Notes (Signed)
Reports SOB X 3 years, worsening over last  Month and today. Denies pain. Pt alert and oriented X4, cooperative, RR even and unlabored, color WNL. Pt in NAD.

## 2019-10-09 NOTE — ED Provider Notes (Signed)
[facility] Emergency Department Provider Note   ____________________________________________   First MD Initiated Contact with Patient 10/09/19 1519     (approximate)  I have reviewed the triage vital signs and the nursing notes.   HISTORY  Chief Complaint Shortness of Breath    HPI Daniel Frye is a 69 y.o. male with past medical history of hypertension, COPD, diastolic CHF, rheumatoid arthritis, and alcohol abuse who presents to the ED complaining of shortness of breath.  Patient reports that he has been feeling short of breath for years, but it has seemed to get worse over the past couple of days.  He describes a dry cough but denies any fevers or chest pain.  He states current symptoms feel similar to prior COPD exacerbations, also reports a history of CHF but denies any swelling in his legs or weight gain.  He states he has been using his inhalers at home but "they do not work like they used to".  He admits to ongoing smoking, typically smokes 4 to 5 cigarettes/day.        Past Medical History:  Diagnosis Date  . Alcohol abuse   . CHF (congestive heart failure) ([place])   . Cocaine use   . COPD (chronic obstructive pulmonary disease) ([place])   . Depression   . Diabetes mellitus without complication ([place])   . Hypertension   . Marijuana use   . Rheumatoid arteritis ([place]) 1989   Took shots for awhile but not now  . Rheumatoid arthritis ([place])   . Substance abuse ([place])   . Tobacco use     Patient Active Problem List   Diagnosis Date Noted  . Acute anaphylaxis 07/31/2019  . Alcohol abuse, daily use 07/31/2019  . Acute respiratory failure with hypoxia ([place]) 07/30/2019  . Aortic atherosclerosis ([place]) 05/15/2018  . Hypokalemia 03/03/2016  . Hypomagnesemia 03/03/2016  . Substance induced mood disorder ([place]) 03/02/2016  . Alcohol withdrawal ([place]) 02/29/2016  . Crack cocaine use 01/22/2016  . Obstructive sleep apnea of adult 03/30/2015  . SOB  (shortness of breath) on exertion 03/30/2015  . Edema of foot 03/30/2015  . Chronic diastolic CHF (congestive heart failure), NYHA class 2 ([place]) 03/30/2015  . Alcohol abuse 03/23/2015  . Noncompliance 03/23/2015  . Vitamin D deficiency 01/27/2015  . Diabetes mellitus type 2, controlled, without complications ([place]) 02/58/5277  . Obesity (BMI 30.0-34.9) 11/26/2014  . COPD (chronic obstructive pulmonary disease) ([place]) 06/22/2014  . Essential (primary) hypertension 06/22/2014  . Rheumatoid arthritis ([place]) 06/22/2014  . Tobacco abuse 06/22/2014  . Chronic obstructive pulmonary disease with acute exacerbation ([place]) 06/22/2014    Past Surgical History:  Procedure Laterality Date  . heart surgery to drain fluid N/A 1987   Heart surgery to remove fluid     Prior to Admission medications   Medication Sig Start Date End Date Taking? Authorizing Provider  albuterol (PROVENTIL) (2.5 MG/3ML) 0.083% nebulizer solution Take 3 mLs (2.5 mg total) by nebulization every 6 (six) hours as needed for wheezing or shortness of breath. 09/04/19   Nena Polio, MD  albuterol (VENTOLIN HFA) 108 (90 Base) MCG/ACT inhaler INHALE 2 PUFFS BY MOUTH EVERY 4 HOURS AS NEEDED FOR WHEEZING/ SHORTNESS OF BREATH 09/25/19   Juline Patch, MD  folic acid (FOLVITE) 1 MG tablet Take 1 mg by mouth daily. 07/09/19   [provider]  HYDROcodone-acetaminophen (NORCO/VICODIN) 5-325 MG tablet Take 1 tablet by mouth daily as needed. 07/15/19   [provider]  methotrexate 2.5  MG tablet Take 8 tablets by mouth once a week. 06/23/19   [provider]  predniSONE (DELTASONE) 20 MG tablet Take 3 tablets (60 mg total) by mouth daily for 5 days. 10/09/19 10/14/19  Blake Divine, MD    Allergies Renflexis [infliximab]  Family History  Problem Relation Age of Onset  . COPD Brother   . Arthritis Brother   . Diabetes Brother   . Hypertension Brother     Social History Social History   Tobacco Use  .  Smoking status: Current Every Day Smoker    Packs/day: 0.25    Years: 51.00    Pack years: 12.75    Types: Cigarettes, Cigars  . Smokeless tobacco: Never Used  Vaping Use  . Vaping Use: Never used  Substance Use Topics  . Alcohol use: Yes    Alcohol/week: 61.0 standard drinks    Types: 12 Cans of beer, 49 Standard drinks or equivalent per week    Comment: Drinks 1/5 day of wine   . Drug use: Yes    Types: "Crack" cocaine, Marijuana    Comment: Uses only once in awhile and does not look for it but has hard time when it is there tunrning it down.     Review of Systems  Constitutional: No fever/chills Eyes: No visual changes. ENT: No sore throat. Cardiovascular: Denies chest pain. Respiratory: Positive for cough and shortness of breath. Gastrointestinal: No abdominal pain.  No nausea, no vomiting.  No diarrhea.  No constipation. Genitourinary: Negative for dysuria. Musculoskeletal: Negative for back pain. Skin: Negative for rash. Neurological: Negative for headaches, focal weakness or numbness.  ____________________________________________   PHYSICAL EXAM:  VITAL SIGNS: ED Triage Vitals  Enc Vitals Group     BP 10/09/19 1133 (!) 155/102     Pulse Rate 10/09/19 1133 61     Resp 10/09/19 1133 18     Temp 10/09/19 1133 99.1 F (37.3 C)     Temp Source 10/09/19 1133 Oral     SpO2 10/09/19 1133 (!) 85 %     Weight 10/09/19 1136 177 lb (80.3 kg)     Height 10/09/19 1136 5' 10.5" (1.791 m)     Head Circumference --      Peak Flow --      Pain Score 10/09/19 1135 4     Pain Loc --      Pain Edu? --      Excl. in [place]? --     Constitutional: Alert and oriented. Eyes: Conjunctivae are normal. Head: Atraumatic. Nose: No congestion/rhinnorhea. Mouth/Throat: Mucous membranes are moist. Neck: Normal ROM Cardiovascular: Normal rate, regular rhythm. Grossly normal heart sounds. Respiratory: Normal respiratory effort.  No retractions.  Lungs with inspiratory and expiratory  wheezing throughout. Gastrointestinal: Soft and nontender. No distention. Genitourinary: deferred Musculoskeletal: No lower extremity tenderness nor edema. Neurologic:  Normal speech and language. No gross focal neurologic deficits are appreciated. Skin:  Skin is warm, dry and intact. No rash noted. Psychiatric: Mood and affect are normal. Speech and behavior are normal.  ____________________________________________   LABS (all labs ordered are listed, but only abnormal results are displayed)  Labs Reviewed  BASIC METABOLIC PANEL - Abnormal; Notable for the following components:      Result Value   CO2 21 (*)    Glucose, Bld 106 (*)    All other components within normal limits  CBC  TROPONIN I (HIGH SENSITIVITY)   ____________________________________________  EKG  ED ECG REPORT I, Blake Divine, the attending  physician, personally viewed and interpreted this ECG.   Date: 10/09/2019  EKG Time: 11:41  Rate: 77  Rhythm: normal sinus rhythm  Axis: Normal  Intervals:none  ST&T Change: None   PROCEDURES  Procedure(s) performed (including Critical Care):  Procedures   ____________________________________________   INITIAL IMPRESSION / ASSESSMENT AND PLAN / ED COURSE       69 year old male with past medical history of hypertension, COPD, CHF, alcohol abuse, and rheumatoid arthritis who presents to the ED complaining of chronic shortness of breath that has gotten acutely worse over the past couple of days.  He describes symptoms as similar to prior COPD exacerbations and has wheezing on exam consistent with COPD.  He is not in any respiratory distress and is maintaining O2 sats on room air, we will treat with DuoNeb x3 as well as prednisone.  EKG shows no evidence of arrhythmia or ischemia, troponin is negative and patient denies chest pain, doubt ACS or PE.  Remainder of labs are unremarkable, chest x-ray with no evidence of pneumonia or pulmonary edema.  CHF seems less  likely as patient does not appear clinically fluid overloaded.  Patient reports feeling much better following breathing treatments and steroids.  He has some mild wheezing on reassessment and was given 1 additional albuterol treatment.  He continues to maintain O2 sats on room air and is appropriate for discharge home with PCP follow-up.  He was counseled to return to the ED for new worsening symptoms, patient agrees with plan.      ____________________________________________   FINAL CLINICAL IMPRESSION(S) / ED DIAGNOSES  Final diagnoses:  COPD exacerbation [facility])     ED Discharge Orders         Ordered    predniSONE (DELTASONE) 20 MG tablet  Daily     Discontinue  Reprint     10/09/19 1646           Note:  This document was prepared using Dragon voice recognition software and may include unintentional dictation errors.   Blake Divine, MD 10/09/19 [phone]

## 2019-10-09 NOTE — ED Triage Notes (Signed)
First Nurse Note:  Arrives [facility], c/o SOB x 2 weeks.  RA stas 96%.  VS wnl.  Patient is AAOx3.  Skin warm and dry.NAD

## 2019-10-18 ENCOUNTER — Telehealth: Payer: Self-pay

## 2019-10-18 NOTE — Telephone Encounter (Signed)
I tried to call pt but "voicemail service has not been established for this customer"- couldn't leave message concerning FIT test

## 2019-10-21 ENCOUNTER — Telehealth: Payer: Self-pay | Admitting: Family Medicine

## 2019-10-21 NOTE — Telephone Encounter (Signed)
NO VM. Pt due to schedule Medicare Annual Wellness Visit (AWV) either virtually/audio only or in office. Whichever the patients preference is.  Last AWV 09/06/17; please schedule at anytime with [facility] Advisor.  This should be a 40 minute visit.

## 2019-11-12 ENCOUNTER — Other Ambulatory Visit: Payer: Self-pay | Admitting: Family Medicine

## 2019-11-12 DIAGNOSIS — J441 Chronic obstructive pulmonary disease with (acute) exacerbation: Secondary | ICD-10-CM

## 2020-01-14 DIAGNOSIS — R892 Abnormal level of other drugs, medicaments and biological substances in specimens from other organs, systems and tissues: Secondary | ICD-10-CM | POA: Insufficient documentation

## 2020-01-14 DIAGNOSIS — Z87898 Personal history of other specified conditions: Secondary | ICD-10-CM | POA: Insufficient documentation

## 2020-01-14 DIAGNOSIS — Z79899 Other long term (current) drug therapy: Secondary | ICD-10-CM | POA: Insufficient documentation

## 2020-01-14 DIAGNOSIS — Z87891 Personal history of nicotine dependence: Secondary | ICD-10-CM | POA: Insufficient documentation

## 2020-01-14 DIAGNOSIS — F1291 Cannabis use, unspecified, in remission: Secondary | ICD-10-CM | POA: Insufficient documentation

## 2020-01-14 DIAGNOSIS — M899 Disorder of bone, unspecified: Secondary | ICD-10-CM | POA: Insufficient documentation

## 2020-01-14 DIAGNOSIS — F1011 Alcohol abuse, in remission: Secondary | ICD-10-CM | POA: Insufficient documentation

## 2020-01-14 DIAGNOSIS — F1491 Cocaine use, unspecified, in remission: Secondary | ICD-10-CM | POA: Insufficient documentation

## 2020-01-14 DIAGNOSIS — Z789 Other specified health status: Secondary | ICD-10-CM | POA: Insufficient documentation

## 2020-01-14 DIAGNOSIS — G894 Chronic pain syndrome: Secondary | ICD-10-CM | POA: Insufficient documentation

## 2020-01-14 NOTE — Progress Notes (Signed)
No-show to appointment.   Below are notations made during precharting review. Historic Controlled Substance Pharmacotherapy Review  Current opioid analgesics:  Hydrocodone/APAP 5/325, 1 tab p.o. 3 times daily, #90 (last prescribed 12/31/2019) MME/day: 15 mg/day  Historical Monitoring: The patient  Has reported current drug use in the past. Drugs: "Crack" cocaine and Marijuana. List of all UDS Test(s): Lab Results  Component Value Date   MDMA NONE DETECTED 07/31/2019   MDMA NONE DETECTED 04/15/2016   COCAINSCRNUR POSITIVE (A) 07/31/2019   COCAINSCRNUR POSITIVE (A) 04/15/2016   PCPSCRNUR NONE DETECTED 07/31/2019   PCPSCRNUR NONE DETECTED 04/15/2016   THCU POSITIVE (A) 07/31/2019   THCU NONE DETECTED 04/15/2016   ETH 6 (H) 04/15/2016   List of other Serum/Urine Drug Screening Test(s):  Lab Results  Component Value Date   COCAINSCRNUR POSITIVE (A) 07/31/2019   COCAINSCRNUR POSITIVE (A) 04/15/2016   THCU POSITIVE (A) 07/31/2019   THCU NONE DETECTED 04/15/2016   ETH 6 (H) 04/15/2016   Historical Background Evaluation: [place] PMP: PDMP reviewed. PMP NARX Score Report:  Narcotic: 302 Sedative: 130 Stimulant: 000 [place] Department of public safety, offender search: Editor, commissioning Information) Positive for selling schedule III controlled substances. Risk Assessment Profile: PMP NARX Overdose Risk Score: 220  Note by: Gaspar Cola, MD Date: 01/15/2020; Time: 7:54 AM

## 2020-01-15 ENCOUNTER — Ambulatory Visit: Payer: Medicare Other | Attending: Pain Medicine | Admitting: Pain Medicine

## 2020-01-15 DIAGNOSIS — F1011 Alcohol abuse, in remission: Secondary | ICD-10-CM

## 2020-01-15 DIAGNOSIS — Z79899 Other long term (current) drug therapy: Secondary | ICD-10-CM

## 2020-01-15 DIAGNOSIS — R892 Abnormal level of other drugs, medicaments and biological substances in specimens from other organs, systems and tissues: Secondary | ICD-10-CM

## 2020-01-15 DIAGNOSIS — Z87891 Personal history of nicotine dependence: Secondary | ICD-10-CM

## 2020-01-15 DIAGNOSIS — M899 Disorder of bone, unspecified: Secondary | ICD-10-CM

## 2020-01-15 DIAGNOSIS — G894 Chronic pain syndrome: Secondary | ICD-10-CM

## 2020-01-15 DIAGNOSIS — Z789 Other specified health status: Secondary | ICD-10-CM

## 2020-01-15 DIAGNOSIS — Z87898 Personal history of other specified conditions: Secondary | ICD-10-CM

## 2020-01-15 DIAGNOSIS — F1911 Other psychoactive substance abuse, in remission: Secondary | ICD-10-CM

## 2020-02-10 ENCOUNTER — Telehealth: Payer: Self-pay | Admitting: Family Medicine

## 2020-02-10 NOTE — Telephone Encounter (Signed)
NO ANSWER/NO VM. Pt due to schedule Medicare Annual Wellness Visit (AWV) either virtually or in office. Whichever the patients preference is.  Last AWV 09/06/17; please schedule at anytime with [facility] Advisor.  This should be a 40 minute visit.

## 2020-03-17 ENCOUNTER — Encounter: Payer: Self-pay | Admitting: Pain Medicine

## 2020-04-28 ENCOUNTER — Other Ambulatory Visit: Payer: Self-pay | Admitting: Family Medicine

## 2020-04-28 DIAGNOSIS — J441 Chronic obstructive pulmonary disease with (acute) exacerbation: Secondary | ICD-10-CM

## 2020-04-28 MED ORDER — PROAIR HFA 108 (90 BASE) MCG/ACT IN AERS: INHALATION_SPRAY | RESPIRATORY_TRACT | 0 refills | Status: DC

## 2020-04-28 NOTE — Telephone Encounter (Signed)
Requested medication (s) are due for refill today - unsure  Requested medication (s) are on the active medication list -yes  Future visit scheduled -no  Last refill: 09/04/19 12 RF  Notes to clinic: Request RF of medication prescribed by outside provider  Requested Prescriptions  Pending Prescriptions Disp Refills   albuterol (PROVENTIL) (2.5 MG/3ML) 0.083% nebulizer solution 75 mL 12    Sig: Take 3 mLs (2.5 mg total) by nebulization every 6 (six) hours as needed for wheezing or shortness of breath.      Pulmonology:  Beta Agonists Failed - 04/28/2020  4:07 PM      Failed - One inhaler should last at least one month. If the patient is requesting refills earlier, contact the patient to check for uncontrolled symptoms.      Passed - Valid encounter within last 12 months    Recent Outpatient Visits           6 months ago Colon cancer screening   [facility] Juline Patch, MD   1 year ago Chronic diastolic heart failure [facility])   [facility] Juline Patch, MD   2 years ago Essential (primary) hypertension   [facility] Juline Patch, MD   2 years ago Generalized pain   [facility] Juline Patch, MD   3 years ago Left leg pain   [facility] [place], Gwyndolyn Saxon, MD                 Signed Prescriptions Disp Refills   PROAIR HFA 108 (90 Base) MCG/ACT inhaler 27 g 0    Sig: INHALE 2 PUFFS BY MOUTH EVERY 4 HOURS AS NEEDED FOR  WHEEZING  OR  SHORTNESS  OF  BREATH      Pulmonology:  Beta Agonists Failed - 04/28/2020  4:07 PM      Failed - One inhaler should last at least one month. If the patient is requesting refills earlier, contact the patient to check for uncontrolled symptoms.      Passed - Valid encounter within last 12 months    Recent Outpatient Visits           6 months ago Colon cancer screening   [facility] Juline Patch, MD   1 year ago Chronic diastolic heart failure [facility])   [facility]  Juline Patch, MD   2 years ago Essential (primary) hypertension   [facility] Juline Patch, MD   2 years ago Generalized pain   [facility] Juline Patch, MD   3 years ago Left leg pain   [facility] [name], MD                    Requested Prescriptions  Pending Prescriptions Disp Refills   albuterol (PROVENTIL) (2.5 MG/3ML) 0.083% nebulizer solution 75 mL 12    Sig: Take 3 mLs (2.5 mg total) by nebulization every 6 (six) hours as needed for wheezing or shortness of breath.      Pulmonology:  Beta Agonists Failed - 04/28/2020  4:07 PM      Failed - One inhaler should last at least one month. If the patient is requesting refills earlier, contact the patient to check for uncontrolled symptoms.      Passed - Valid encounter within last 12 months    Recent Outpatient Visits           6 months ago Colon  cancer screening   [facility] Juline Patch, MD   1 year ago Chronic diastolic heart failure [facility])   [facility] Juline Patch, MD   2 years ago Essential (primary) hypertension   [facility] Juline Patch, MD   2 years ago Generalized pain   [facility] Juline Patch, MD   3 years ago Left leg pain   [facility] [place], Gwyndolyn Saxon, MD                 Signed Prescriptions Disp Refills   PROAIR HFA 108 (90 Base) MCG/ACT inhaler 27 g 0    Sig: INHALE 2 PUFFS BY MOUTH EVERY 4 HOURS AS NEEDED FOR  WHEEZING  OR  SHORTNESS  OF  BREATH      Pulmonology:  Beta Agonists Failed - 04/28/2020  4:07 PM      Failed - One inhaler should last at least one month. If the patient is requesting refills earlier, contact the patient to check for uncontrolled symptoms.      Passed - Valid encounter within last 12 months    Recent Outpatient Visits           6 months ago Colon cancer screening   [place], MD   1 year ago Chronic diastolic heart failure  [facility])   [facility] Juline Patch, MD   2 years ago Essential (primary) hypertension   [facility] Juline Patch, MD   2 years ago Generalized pain   [facility] Juline Patch, MD   3 years ago Left leg pain   [facility] [name], MD

## 2020-04-28 NOTE — Telephone Encounter (Signed)
Medication Refill - Medication:   albuterol (PROVENTIL) (2.5 MG/3ML) 0.083% nebulizer solution   PROAIR HFA 108 (90 Base) MCG/ACT inhaler     Has the patient contacted their pharmacy? Yes.  no refills and PT needs this to breathe.   Preferred Pharmacy (with phone number or street name):   [place], [place] [facility] [place]  [address] [place] [address]  Phone:  [phone] Fax:  [phone]    Agent: Please be advised that RX refills may take up to 3 business days. We ask that you follow-up with your pharmacy.

## 2020-04-29 MED ORDER — ALBUTEROL SULFATE (2.5 MG/3ML) 0.083% IN NEBU: 2.5000 mg | INHALATION_SOLUTION | Freq: Four times a day (QID) | RESPIRATORY_TRACT | 0 refills | Status: DC | PRN

## 2020-05-28 ENCOUNTER — Other Ambulatory Visit: Payer: Self-pay

## 2020-05-28 ENCOUNTER — Encounter: Payer: Self-pay | Admitting: Family Medicine

## 2020-05-28 ENCOUNTER — Ambulatory Visit (INDEPENDENT_AMBULATORY_CARE_PROVIDER_SITE_OTHER): Payer: Medicare Other | Admitting: Family Medicine

## 2020-05-28 VITALS — BP 178/94 | HR 95 | Ht 70.5 in | Wt 177.0 lb

## 2020-05-28 DIAGNOSIS — J441 Chronic obstructive pulmonary disease with (acute) exacerbation: Secondary | ICD-10-CM | POA: Diagnosis not present

## 2020-05-28 DIAGNOSIS — G629 Polyneuropathy, unspecified: Secondary | ICD-10-CM

## 2020-05-28 DIAGNOSIS — R739 Hyperglycemia, unspecified: Secondary | ICD-10-CM | POA: Diagnosis not present

## 2020-05-28 LAB — GLUCOSE, POCT (MANUAL RESULT ENTRY): POC Glucose: 111 mg/dl — AB (ref 70–99)

## 2020-05-28 MED ORDER — PREDNISONE 10 MG PO TABS: 10.0000 mg | ORAL_TABLET | Freq: Every day | ORAL | 0 refills | Status: DC

## 2020-05-28 MED ORDER — GABAPENTIN 100 MG PO CAPS: 100.0000 mg | ORAL_CAPSULE | Freq: Two times a day (BID) | ORAL | 3 refills | Status: DC

## 2020-05-28 NOTE — Addendum Note (Signed)
Addended by: Fredderick Severance on: 05/28/2020 01:54 PM   Modules accepted: Orders

## 2020-05-28 NOTE — Progress Notes (Signed)
Date:  05/28/2020   Name:  Daniel Frye   DOB:  [birth year]   MRN:  [id number]   Chief Complaint: Shortness of Breath (Having to use inhaler more often) and Prediabetes (Was Dx in May of 2021)  HPI  Lab Results  Component Value Date   CREATININE 1.07 10/09/2019   BUN 20 10/09/2019   NA 139 10/09/2019   K 3.7 10/09/2019   CL 106 10/09/2019   CO2 21 (L) 10/09/2019   No results found for: CHOL, HDL, LDLCALC, LDLDIRECT, TRIG, CHOLHDL Lab Results  Component Value Date   TSH 0.633 11/26/2014   Lab Results  Component Value Date   HGBA1C 5.7 (H) 07/31/2019   Lab Results  Component Value Date   WBC 7.3 10/09/2019   HGB 15.0 10/09/2019   HCT 45.3 10/09/2019   MCV 95.8 10/09/2019   PLT 166 10/09/2019   Lab Results  Component Value Date   ALT 14 09/04/2019   AST 17 09/04/2019   ALKPHOS 90 09/04/2019   BILITOT 0.7 09/04/2019     Review of Systems  Patient Active Problem List   Diagnosis Date Noted  . Chronic pain syndrome 01/14/2020  . Pharmacologic therapy 01/14/2020  . Disorder of skeletal system 01/14/2020  . Problems influencing health status 01/14/2020  . History of substance use disorder 01/14/2020  . Abnormal drug screens 01/14/2020  . History of alcohol abuse 01/14/2020  . History of cocaine use 01/14/2020  . History of marijuana use 01/14/2020  . History of tobacco abuse 01/14/2020  . Acute anaphylaxis 07/31/2019  . Acute respiratory failure with hypoxia ([place]) 07/30/2019  . DDD (degenerative disc disease), lumbar 01/07/2019  . Cervical spondylosis with myelopathy 08/01/2018  . Neck pain 05/30/2018  . Degenerative cervical spinal stenosis 05/29/2018  . Aortic atherosclerosis ([place]) 05/15/2018  . Depression 04/01/2018  . Numbness and tingling in both hands 03/13/2018  . Encounter for long-term (current) use of high-risk medication 08/09/2017  . Primary osteoarthritis of both knees 08/09/2017  . Hypokalemia 03/03/2016  . Hypomagnesemia 03/03/2016  .  Substance induced mood disorder ([place]) 03/02/2016  . Alcohol withdrawal ([place]) 02/29/2016  . Crack cocaine use 01/22/2016  . Obstructive sleep apnea of adult 03/30/2015  . Edema of foot 03/30/2015  . Chronic diastolic CHF (congestive heart failure), NYHA class 2 ([place]) 03/30/2015  . Apnea, sleep 03/30/2015  . Alcohol abuse 03/23/2015  . Noncompliance 03/23/2015  . Vitamin D deficiency 01/27/2015  . Diabetes mellitus type 2, controlled, without complications ([place]) 66/29/4765  . Obesity (BMI 30.0-34.9) 11/26/2014  . COPD (chronic obstructive pulmonary disease) ([place]) 06/22/2014  . Essential (primary) hypertension 06/22/2014  . Rheumatoid arthritis involving multiple sites ([place]) 06/22/2014  . Tobacco abuse 06/22/2014  . Chronic obstructive pulmonary disease with acute exacerbation ([place]) 06/22/2014  . History of drug abuse ([place]) 03/05/2009  . SOB (shortness of breath) on exertion 03/05/2009  . Alcohol abuse, daily use 03/05/2009  . Cocaine dependence, abuse ([place]) 03/05/2009  . Fever 03/05/2009  . Wheezing 03/05/2009    Allergies  Allergen Reactions  . Renflexis [Infliximab] Anaphylaxis    Past Surgical History:  Procedure Laterality Date  . heart surgery to drain fluid N/A 1987   Heart surgery to remove fluid     Social History   Tobacco Use  . Smoking status: Current Every Day Smoker    Packs/day: 0.25    Years: 51.00    Pack years: 12.75    Types: Cigarettes, Cigars  . Smokeless tobacco: Never Used  Vaping Use  . Vaping Use: Never used  Substance Use Topics  . Alcohol use: Yes    Alcohol/week: 61.0 standard drinks    Types: 12 Cans of beer, 49 Standard drinks or equivalent per week    Comment: Drinks 1/5 day of wine   . Drug use: Yes    Types: "Crack" cocaine, Marijuana    Comment: Uses only once in awhile and does not look for it but has hard time when it is there tunrning it down.      Medication list has been reviewed and updated.  Current Meds  Medication  Sig  . albuterol (PROVENTIL) (2.5 MG/3ML) 0.083% nebulizer solution Take 3 mLs (2.5 mg total) by nebulization every 6 (six) hours as needed for wheezing or shortness of breath.  . methotrexate 2.5 MG tablet Take 8 tablets by mouth once a week.  Marland Kitchen PROAIR HFA 108 (90 Base) MCG/ACT inhaler INHALE 2 PUFFS BY MOUTH EVERY 4 HOURS AS NEEDED FOR  WHEEZING  OR  SHORTNESS  OF  BREATH    PHQ 2/9 Scores 05/28/2020 10/02/2019 12/05/2017 09/13/2017  PHQ - 2 Score 0 2 0 1  PHQ- 9 Score 1 2 0 -    GAD 7 : Generalized Anxiety Score 05/28/2020 10/02/2019  Nervous, Anxious, on Edge 0 0  Control/stop worrying 0 0  Worry too much - different things 0 0  Trouble relaxing 0 0  Restless 0 0  Easily annoyed or irritable 0 0  Afraid - awful might happen 0 0  Total GAD 7 Score 0 0    BP Readings from Last 3 Encounters:  05/28/20 (!) 178/94  10/09/19 (!) 155/102  10/02/19 (!) 110/62    Physical Exam  Wt Readings from Last 3 Encounters:  05/28/20 177 lb (80.3 kg)  10/09/19 177 lb (80.3 kg)  10/02/19 177 lb (80.3 kg)    BP (!) 178/94   Pulse 95   Ht 5' 10.5" (1.791 m)   Wt 177 lb (80.3 kg)   SpO2 99%   BMI 25.04 kg/m   Assessment and Plan: 1. Chronic obstructive pulmonary disease with acute exacerbation (HCC) Chronic.  Uncontrolled.  Relatively stable.  But review of x-ray from last summer notes that there is emphysema and patient continues to smoke both tobacco and marijuana.  Patient has a very skewed view of smoking and lung damage.  Patient will continue his albuterol inhaler.  We will add for 2 weeks of Trelegy inhaler 1 puff a day 100 mg.  And will initiate prednisone 10 mg once a day for COPD exacerbation. - predniSONE (DELTASONE) 10 MG tablet; Take 1 tablet (10 mg total) by mouth daily with breakfast.  Dispense: 14 tablet; Refill: 0  2. Neuropathy Chronic.  Uncontrolled.  Patient is seeing rheumatology but has burning pain across the metatarsal heads.  There is no tenderness of the tendons this  may suggest that there is a neuropathy and we will initiate Trelegy sample. - Ambulatory referral to Podiatry - gabapentin (NEURONTIN) 100 MG capsule; Take 1 capsule (100 mg total) by mouth 2 (two) times daily.  Dispense: 90 capsule; Refill: 3  3. Hyperglycemia Patient has had elevated blood sugars in the past and we will check an A1c to see if this may be contributing to his neuropathy. - HgB A1c  Patient has an elevated blood pressure which could be contributing to some of his other issues.  We will have patient return in 3 to 4 weeks for blood pressure check.

## 2020-05-29 LAB — HEMOGLOBIN A1C
Est. average glucose Bld gHb Est-mCnc: 100 mg/dL
Hgb A1c MFr Bld: 5.1 % (ref 4.8–5.6)

## 2020-06-03 DIAGNOSIS — M4802 Spinal stenosis, cervical region: Secondary | ICD-10-CM | POA: Diagnosis not present

## 2020-06-03 DIAGNOSIS — M5136 Other intervertebral disc degeneration, lumbar region: Secondary | ICD-10-CM | POA: Diagnosis not present

## 2020-06-03 DIAGNOSIS — M170 Bilateral primary osteoarthritis of knee: Secondary | ICD-10-CM | POA: Diagnosis not present

## 2020-06-03 DIAGNOSIS — M0579 Rheumatoid arthritis with rheumatoid factor of multiple sites without organ or systems involvement: Secondary | ICD-10-CM | POA: Diagnosis not present

## 2020-06-03 DIAGNOSIS — Z79899 Other long term (current) drug therapy: Secondary | ICD-10-CM | POA: Diagnosis not present

## 2020-06-22 DIAGNOSIS — H25013 Cortical age-related cataract, bilateral: Secondary | ICD-10-CM | POA: Diagnosis not present

## 2020-06-22 LAB — HM DIABETES EYE EXAM

## 2020-06-26 ENCOUNTER — Ambulatory Visit (INDEPENDENT_AMBULATORY_CARE_PROVIDER_SITE_OTHER): Payer: Medicare Other | Admitting: Family Medicine

## 2020-06-26 ENCOUNTER — Encounter: Payer: Self-pay | Admitting: Family Medicine

## 2020-06-26 ENCOUNTER — Other Ambulatory Visit: Payer: Self-pay

## 2020-06-26 VITALS — BP 130/110 | HR 60 | Ht 70.5 in | Wt 180.0 lb

## 2020-06-26 DIAGNOSIS — I10 Essential (primary) hypertension: Secondary | ICD-10-CM

## 2020-06-26 DIAGNOSIS — R739 Hyperglycemia, unspecified: Secondary | ICD-10-CM | POA: Diagnosis not present

## 2020-06-26 MED ORDER — LISINOPRIL-HYDROCHLOROTHIAZIDE 10-12.5 MG PO TABS: 1.0000 | ORAL_TABLET | Freq: Every day | ORAL | 0 refills | Status: DC

## 2020-06-26 NOTE — Progress Notes (Signed)
Date:  06/26/2020   Name:  Daniel Frye   DOB:  1950-12-17   MRN:  [id number]   Chief Complaint: Hypertension  Hypertension This is a chronic problem. The current episode started more than 1 year ago. The problem has been gradually worsening since onset. The problem is uncontrolled. Pertinent negatives include no anxiety, blurred vision, chest pain, headaches, malaise/fatigue, neck pain, orthopnea, palpitations, peripheral edema, PND, shortness of breath or sweats. There are no associated agents to hypertension. There are no known risk factors for coronary artery disease. Past treatments include nothing. There are no compliance problems.  There is no history of angina, kidney disease, CAD/MI, CVA, heart failure, left ventricular hypertrophy, PVD or retinopathy. There is no history of chronic renal disease, a hypertension causing med or renovascular disease.    Lab Results  Component Value Date   CREATININE 1.07 10/09/2019   BUN 20 10/09/2019   NA 139 10/09/2019   K 3.7 10/09/2019   CL 106 10/09/2019   CO2 21 (L) 10/09/2019   No results found for: CHOL, HDL, LDLCALC, LDLDIRECT, TRIG, CHOLHDL Lab Results  Component Value Date   TSH 0.633 11/26/2014   Lab Results  Component Value Date   HGBA1C 5.1 05/28/2020   Lab Results  Component Value Date   WBC 7.3 10/09/2019   HGB 15.0 10/09/2019   HCT 45.3 10/09/2019   MCV 95.8 10/09/2019   PLT 166 10/09/2019   Lab Results  Component Value Date   ALT 14 09/04/2019   AST 17 09/04/2019   ALKPHOS 90 09/04/2019   BILITOT 0.7 09/04/2019     Review of Systems  Constitutional: Negative for chills, fever and malaise/fatigue.  HENT: Negative for drooling, ear discharge, ear pain and sore throat.   Eyes: Negative for blurred vision.  Respiratory: Negative for cough, shortness of breath and wheezing.   Cardiovascular: Negative for chest pain, palpitations, orthopnea, leg swelling and PND.  Gastrointestinal: Negative for abdominal pain,  blood in stool, constipation, diarrhea and nausea.  Endocrine: Negative for polydipsia.  Genitourinary: Negative for dysuria, frequency, hematuria and urgency.  Musculoskeletal: Negative for back pain, myalgias and neck pain.  Skin: Negative for rash.  Allergic/Immunologic: Negative for environmental allergies.  Neurological: Negative for dizziness and headaches.  Hematological: Does not bruise/bleed easily.  Psychiatric/Behavioral: Negative for suicidal ideas. The patient is not nervous/anxious.     Patient Active Problem List   Diagnosis Date Noted  . Chronic pain syndrome 01/14/2020  . Pharmacologic therapy 01/14/2020  . Disorder of skeletal system 01/14/2020  . Problems influencing health status 01/14/2020  . History of substance use disorder 01/14/2020  . Abnormal drug screens 01/14/2020  . History of alcohol abuse 01/14/2020  . History of cocaine use 01/14/2020  . History of marijuana use 01/14/2020  . History of tobacco abuse 01/14/2020  . Acute anaphylaxis 07/31/2019  . Acute respiratory failure with hypoxia ([place]) 07/30/2019  . DDD (degenerative disc disease), lumbar 01/07/2019  . Cervical spondylosis with myelopathy 08/01/2018  . Neck pain 05/30/2018  . Degenerative cervical spinal stenosis 05/29/2018  . Aortic atherosclerosis ([place]) 05/15/2018  . Depression 04/01/2018  . Numbness and tingling in both hands 03/13/2018  . Encounter for long-term (current) use of high-risk medication 08/09/2017  . Primary osteoarthritis of both knees 08/09/2017  . Hypokalemia 03/03/2016  . Hypomagnesemia 03/03/2016  . Substance induced mood disorder ([place]) 03/02/2016  . Alcohol withdrawal ([place]) 02/29/2016  . Crack cocaine use 01/22/2016  . Obstructive sleep apnea of adult 03/30/2015  .  Edema of foot 03/30/2015  . Chronic diastolic CHF (congestive heart failure), NYHA class 2 ([place]) 03/30/2015  . Apnea, sleep 03/30/2015  . Alcohol abuse 03/23/2015  . Noncompliance 03/23/2015  . Vitamin  D deficiency 01/27/2015  . Diabetes mellitus type 2, controlled, without complications ([place]) 46/65/9935  . Obesity (BMI 30.0-34.9) 11/26/2014  . COPD (chronic obstructive pulmonary disease) ([place]) 06/22/2014  . Essential (primary) hypertension 06/22/2014  . Rheumatoid arthritis involving multiple sites ([place]) 06/22/2014  . Tobacco abuse 06/22/2014  . Chronic obstructive pulmonary disease with acute exacerbation ([place]) 06/22/2014  . History of drug abuse ([place]) 03/05/2009  . SOB (shortness of breath) on exertion 03/05/2009  . Alcohol abuse, daily use 03/05/2009  . Cocaine dependence, abuse ([place]) 03/05/2009  . Fever 03/05/2009  . Wheezing 03/05/2009    Allergies  Allergen Reactions  . Renflexis [Infliximab] Anaphylaxis    Past Surgical History:  Procedure Laterality Date  . heart surgery to drain fluid N/A 1987   Heart surgery to remove fluid     Social History   Tobacco Use  . Smoking status: Current Every Day Smoker    Packs/day: 0.25    Years: 51.00    Pack years: 12.75    Types: Cigarettes, Cigars  . Smokeless tobacco: Never Used  Vaping Use  . Vaping Use: Never used  Substance Use Topics  . Alcohol use: Yes    Alcohol/week: 61.0 standard drinks    Types: 12 Cans of beer, 49 Standard drinks or equivalent per week    Comment: Drinks 1/5 day of wine   . Drug use: Yes    Types: "Crack" cocaine, Marijuana    Comment: Uses only once in awhile and does not look for it but has hard time when it is there tunrning it down.      Medication list has been reviewed and updated.  Current Meds  Medication Sig  . Adalimumab (HUMIRA PEN) 40 MG/0.4ML PNKT Inject into the skin.  Marland Kitchen albuterol (PROVENTIL) (2.5 MG/3ML) 0.083% nebulizer solution Take 3 mLs (2.5 mg total) by nebulization every 6 (six) hours as needed for wheezing or shortness of breath.  . folic acid (FOLVITE) 1 MG tablet Take 1 mg by mouth daily.  Marland Kitchen gabapentin (NEURONTIN) 100 MG capsule Take 1 capsule (100 mg total) by  mouth 2 (two) times daily.  . methotrexate 2.5 MG tablet Take 8 tablets by mouth once a week.  Marland Kitchen PROAIR HFA 108 (90 Base) MCG/ACT inhaler INHALE 2 PUFFS BY MOUTH EVERY 4 HOURS AS NEEDED FOR  WHEEZING  OR  SHORTNESS  OF  BREATH    PHQ 2/9 Scores 05/28/2020 10/02/2019 12/05/2017 09/13/2017  PHQ - 2 Score 0 2 0 1  PHQ- 9 Score 1 2 0 -    GAD 7 : Generalized Anxiety Score 05/28/2020 10/02/2019  Nervous, Anxious, on Edge 0 0  Control/stop worrying 0 0  Worry too much - different things 0 0  Trouble relaxing 0 0  Restless 0 0  Easily annoyed or irritable 0 0  Afraid - awful might happen 0 0  Total GAD 7 Score 0 0    BP Readings from Last 3 Encounters:  06/26/20 (!) 130/110  05/28/20 (!) 178/94  10/09/19 (!) 155/102    Physical Exam Vitals and nursing note reviewed.  HENT:     Head: Normocephalic.     Right Ear: Tympanic membrane, ear canal and external ear normal.     Left Ear: Tympanic membrane, ear canal and external ear normal.  Nose: Nose normal. No congestion or rhinorrhea.     Mouth/Throat:     Mouth: Mucous membranes are moist.  Eyes:     General: No scleral icterus.       Right eye: No discharge.        Left eye: No discharge.     Conjunctiva/sclera: Conjunctivae normal.     Pupils: Pupils are equal, round, and reactive to light.  Neck:     Thyroid: No thyromegaly.     Vascular: No JVD.     Trachea: No tracheal deviation.  Cardiovascular:     Rate and Rhythm: Normal rate and regular rhythm.     Heart sounds: Normal heart sounds. No murmur heard. No friction rub. No gallop.   Pulmonary:     Effort: No respiratory distress.     Breath sounds: Normal breath sounds. No wheezing, rhonchi or rales.  Abdominal:     General: Bowel sounds are normal.     Palpations: Abdomen is soft. There is no mass.     Tenderness: There is no abdominal tenderness. There is no guarding or rebound.  Musculoskeletal:        General: No tenderness. Normal range of motion.     Cervical  back: Normal range of motion and neck supple.  Lymphadenopathy:     Cervical: No cervical adenopathy.  Skin:    General: Skin is warm.     Findings: No rash.  Neurological:     Mental Status: He is alert and oriented to person, place, and time.     Cranial Nerves: No cranial nerve deficit.     Deep Tendon Reflexes: Reflexes are normal and symmetric.     Wt Readings from Last 3 Encounters:  06/26/20 180 lb (81.6 kg)  05/28/20 177 lb (80.3 kg)  10/09/19 177 lb (80.3 kg)    BP (!) 130/110   Pulse 60   Ht 5' 10.5" (1.791 m)   Wt 180 lb (81.6 kg)   BMI 25.46 kg/m   Assessment and Plan:  1. Essential (primary) hypertension Chronic.  Uncontrolled.  Stable.  Patient continues to have elevated blood pressure readings and we will initiate antihypertensive medication therapy with lisinopril hydrochlorothiazide 10-12.5 mg once a day and we will recheck this in 6 to 8 weeks.  2. Hyperglycemia Patient also has had some hyperglycemia and we will recheck an A1c upon his return next visit. - lisinopril-hydrochlorothiazide (ZESTORETIC) 10-12.5 MG tablet; Take 1 tablet by mouth daily.  Dispense: 90 tablet; Refill: 0

## 2020-07-12 ENCOUNTER — Other Ambulatory Visit: Payer: Self-pay | Admitting: Family Medicine

## 2020-07-12 DIAGNOSIS — R739 Hyperglycemia, unspecified: Secondary | ICD-10-CM

## 2020-08-25 ENCOUNTER — Encounter: Payer: Self-pay | Admitting: Family Medicine

## 2020-08-25 ENCOUNTER — Other Ambulatory Visit: Payer: Self-pay

## 2020-08-25 ENCOUNTER — Ambulatory Visit (INDEPENDENT_AMBULATORY_CARE_PROVIDER_SITE_OTHER): Payer: Medicare Other | Admitting: Family Medicine

## 2020-08-25 VITALS — BP 128/76 | HR 84 | Ht 70.5 in | Wt 165.0 lb

## 2020-08-25 DIAGNOSIS — J432 Centrilobular emphysema: Secondary | ICD-10-CM

## 2020-08-25 DIAGNOSIS — R112 Nausea with vomiting, unspecified: Secondary | ICD-10-CM | POA: Diagnosis not present

## 2020-08-25 DIAGNOSIS — F1011 Alcohol abuse, in remission: Secondary | ICD-10-CM | POA: Diagnosis not present

## 2020-08-25 DIAGNOSIS — R634 Abnormal weight loss: Secondary | ICD-10-CM | POA: Diagnosis not present

## 2020-08-25 DIAGNOSIS — Z87891 Personal history of nicotine dependence: Secondary | ICD-10-CM | POA: Diagnosis not present

## 2020-08-25 DIAGNOSIS — R5382 Chronic fatigue, unspecified: Secondary | ICD-10-CM | POA: Diagnosis not present

## 2020-08-25 DIAGNOSIS — R059 Cough, unspecified: Secondary | ICD-10-CM

## 2020-08-25 NOTE — Progress Notes (Signed)
Date:  08/25/2020   Name:  Daniel Frye   DOB:  07-07-1950   MRN:  [id number]   Chief Complaint: Weight Loss (Lost 15 pounds since April)  Thyroid Problem Presents for initial (for fatigue and weight loss) visit. Symptoms include fatigue and weight loss. Patient reports no anxiety, cold intolerance, constipation, depressed mood, diaphoresis, diarrhea, dry skin, hair loss, heat intolerance, hoarse voice, leg swelling, nail problem, palpitations, tremors, visual change or weight gain. The symptoms have been worsening.  Emesis  This is a new problem. The current episode started in the past 7 days (episodes x 2). The problem has been gradually worsening. Associated symptoms include weight loss. Pertinent negatives include no abdominal pain, arthralgias, chest pain, chills, coughing, diarrhea, dizziness, fever, headaches, myalgias, sweats or URI. The treatment provided moderate relief.   Lab Results  Component Value Date   CREATININE 1.07 10/09/2019   BUN 20 10/09/2019   NA 139 10/09/2019   K 3.7 10/09/2019   CL 106 10/09/2019   CO2 21 (L) 10/09/2019   No results found for: CHOL, HDL, LDLCALC, LDLDIRECT, TRIG, CHOLHDL Lab Results  Component Value Date   TSH 0.633 11/26/2014   Lab Results  Component Value Date   HGBA1C 5.1 05/28/2020   Lab Results  Component Value Date   WBC 7.3 10/09/2019   HGB 15.0 10/09/2019   HCT 45.3 10/09/2019   MCV 95.8 10/09/2019   PLT 166 10/09/2019   Lab Results  Component Value Date   ALT 14 09/04/2019   AST 17 09/04/2019   ALKPHOS 90 09/04/2019   BILITOT 0.7 09/04/2019     Review of Systems  Constitutional:  Positive for fatigue and weight loss. Negative for chills, diaphoresis, fever and weight gain.  HENT:  Negative for drooling, ear discharge, ear pain, hoarse voice and sore throat.   Respiratory:  Negative for cough, shortness of breath and wheezing.   Cardiovascular:  Negative for chest pain, palpitations and leg swelling.   Gastrointestinal:  Positive for vomiting. Negative for abdominal pain, blood in stool, constipation, diarrhea and nausea.  Endocrine: Negative for cold intolerance, heat intolerance and polydipsia.  Genitourinary:  Negative for dysuria, frequency, hematuria and urgency.  Musculoskeletal:  Negative for arthralgias, back pain, myalgias and neck pain.  Skin:  Negative for rash.  Allergic/Immunologic: Negative for environmental allergies.  Neurological:  Negative for dizziness, tremors and headaches.  Hematological:  Does not bruise/bleed easily.  Psychiatric/Behavioral:  Negative for suicidal ideas. The patient is not nervous/anxious.    Patient Active Problem List   Diagnosis Date Noted   Chronic pain syndrome 01/14/2020   Pharmacologic therapy 01/14/2020   Disorder of skeletal system 01/14/2020   Problems influencing health status 01/14/2020   History of substance use disorder 01/14/2020   Abnormal drug screens 01/14/2020   History of alcohol abuse 01/14/2020   History of cocaine use 01/14/2020   History of marijuana use 01/14/2020   History of tobacco abuse 01/14/2020   Acute anaphylaxis 07/31/2019   Acute respiratory failure with hypoxia ([place]) 07/30/2019   DDD (degenerative disc disease), lumbar 01/07/2019   Cervical spondylosis with myelopathy 08/01/2018   Neck pain 05/30/2018   Degenerative cervical spinal stenosis 05/29/2018   Aortic atherosclerosis ([place]) 05/15/2018   Depression 04/01/2018   Numbness and tingling in both hands 03/13/2018   Encounter for long-term (current) use of high-risk medication 08/09/2017   Primary osteoarthritis of both knees 08/09/2017   Hypokalemia 03/03/2016   Hypomagnesemia 03/03/2016   Substance induced mood  disorder ([place]) 03/02/2016   Alcohol withdrawal ([place]) 02/29/2016   Crack cocaine use 01/22/2016   Obstructive sleep apnea of adult 03/30/2015   Edema of foot 03/30/2015   Chronic diastolic CHF (congestive heart failure), NYHA class 2 ([place])  03/30/2015   Apnea, sleep 03/30/2015   Alcohol abuse 03/23/2015   Noncompliance 03/23/2015   Vitamin D deficiency 01/27/2015   Diabetes mellitus type 2, controlled, without complications ([place]) 16/09/3708   Obesity (BMI 30.0-34.9) 11/26/2014   COPD (chronic obstructive pulmonary disease) ([place]) 06/22/2014   Essential (primary) hypertension 06/22/2014   Rheumatoid arthritis involving multiple sites ([place]) 06/22/2014   Tobacco abuse 06/22/2014   Chronic obstructive pulmonary disease with acute exacerbation ([place]) 06/22/2014   History of drug abuse ([place]) 03/05/2009   SOB (shortness of breath) on exertion 03/05/2009   Alcohol abuse, daily use 03/05/2009   Cocaine dependence, abuse ([place]) 03/05/2009   Fever 03/05/2009   Wheezing 03/05/2009    Allergies  Allergen Reactions   Renflexis [Infliximab] Anaphylaxis    Past Surgical History:  Procedure Laterality Date   heart surgery to drain fluid N/A 1987   Heart surgery to remove fluid     Social History   Tobacco Use   Smoking status: Every Day    Packs/day: 0.25    Years: 51.00    Pack years: 12.75    Types: Cigarettes, Cigars   Smokeless tobacco: Never  Vaping Use   Vaping Use: Never used  Substance Use Topics   Alcohol use: Yes    Alcohol/week: 61.0 standard drinks    Types: 12 Cans of beer, 49 Standard drinks or equivalent per week    Comment: Drinks 1/5 day of wine    Drug use: Yes    Types: "Crack" cocaine, Marijuana    Comment: Uses only once in awhile and does not look for it but has hard time when it is there tunrning it down.      Medication list has been reviewed and updated.  No outpatient medications have been marked as taking for the 08/25/20 encounter (Office Visit) with Juline Patch, MD.    [facility] 2/9 Scores 08/25/2020 05/28/2020 10/02/2019 12/05/2017  PHQ - 2 Score 3 0 2 0  PHQ- 9 Score 9 1 2  0    GAD 7 : Generalized Anxiety Score 08/25/2020 05/28/2020 10/02/2019  Nervous, Anxious, on Edge 0 0 0   Control/stop worrying 0 0 0  Worry too much - different things 0 0 0  Trouble relaxing 0 0 0  Restless 0 0 0  Easily annoyed or irritable 0 0 0  Afraid - awful might happen 1 0 0  Total GAD 7 Score 1 0 0  Anxiety Difficulty Somewhat difficult - -    BP Readings from Last 3 Encounters:  08/25/20 128/76  06/26/20 (!) 130/110  05/28/20 (!) 178/94    Physical Exam Vitals and nursing note reviewed.  HENT:     Head: Normocephalic.     Right Ear: Tympanic membrane, ear canal and external ear normal.     Left Ear: Tympanic membrane, ear canal and external ear normal.     Nose: Nose normal.     Mouth/Throat:     Mouth: Mucous membranes are moist.  Eyes:     General: No scleral icterus.       Right eye: No discharge.        Left eye: No discharge.     Conjunctiva/sclera: Conjunctivae normal.     Pupils: Pupils are equal,  round, and reactive to light.  Neck:     Thyroid: No thyromegaly.     Vascular: No JVD.     Trachea: No tracheal deviation.  Cardiovascular:     Rate and Rhythm: Normal rate and regular rhythm.     Heart sounds: Normal heart sounds. No murmur heard.   No friction rub. No gallop.  Pulmonary:     Effort: No respiratory distress.     Breath sounds: Normal breath sounds. No wheezing, rhonchi or rales.  Abdominal:     General: Bowel sounds are normal.     Palpations: Abdomen is soft. There is no mass.     Tenderness: There is no abdominal tenderness. There is no guarding or rebound.  Musculoskeletal:        General: No tenderness. Normal range of motion.     Cervical back: Normal range of motion and neck supple.  Lymphadenopathy:     Cervical: No cervical adenopathy.  Skin:    General: Skin is warm.     Findings: No rash.  Neurological:     Mental Status: He is alert and oriented to person, place, and time.     Cranial Nerves: No cranial nerve deficit.     Deep Tendon Reflexes: Reflexes are normal and symmetric.    Wt Readings from Last 3 Encounters:   08/25/20 165 lb (74.8 kg)  06/26/20 180 lb (81.6 kg)  05/28/20 177 lb (80.3 kg)    BP 128/76   Pulse 84   Ht 5' 10.5" (1.791 m)   Wt 165 lb (74.8 kg)   BMI 23.34 kg/m   Assessment and Plan:  1. Chronic fatigue Chronic.  Persistent.  Patient continues to feel fatigue and upon return after 2 months there is further weight loss as noted below.  Patient did not bring back information concerning his FIT test.  We will obtain lab work including thyroid panel, CBC, and CMP for evaluation of his fatigue and below weight loss.  Referral has been made to gastroenterology because of new onset nausea and vomiting and the concern of a possible GI related etiology concerning his weight loss and fatigue/malaise. - Ambulatory referral to Gastroenterology - Thyroid Panel With TSH - CBC with Differential/Platelet - Comprehensive metabolic panel  2. Weight loss Chronic.  Patient has a 15 pound weight loss over the course of the last 2 months.  We will initiate work-up with below panel and will recheck patient in a few weeks. - Ambulatory referral to Gastroenterology - Thyroid Panel With TSH - CBC with Differential/Platelet - Comprehensive metabolic panel  3. Cough Chronic.  Controlled.  Stable.  Patient does have a history of a paraseptal emphysema and that was seen on previous CT several years ago.  Patient continues to abuse alcohol and tobacco and this is probably contributed to his lung disease.  We will contact [facility] concerning a spiral CT for evaluation of possible lung concern.  4. History of alcohol abuse As noted in history and patient continues to abuse alcohol to some extent.  5. History of tobacco abuse Patient has been advised of the health risks of smoking and counseled concerning cessation of tobacco products. I spent over 3 minutes for discussion and to answer questions.   6. Centrilobular emphysema ([place]) As noted then this may contribute significantly to his weight loss.  Patient  continues to smoke a pack of cigarettes that will last him 3 days but I have explained to him that this is not acceptable either.  Patient is giving further consideration for cessation.  7. Non-intractable vomiting with nausea, unspecified vomiting type Patient has noted some increase in nausea and has vomited twice over the past 3 days.  There is been no hematemesis or or blood that has been noted by his description.  But because of the weight loss and the pending symptoms will refer to GI for possibility of evaluation further or treatment of gastritis therapy - Ambulatory referral to Gastroenterology

## 2020-08-25 NOTE — Addendum Note (Signed)
Addended by: Fredderick Severance on: 08/25/2020 02:51 PM   Modules accepted: Orders

## 2020-08-26 ENCOUNTER — Other Ambulatory Visit: Payer: Self-pay

## 2020-08-26 DIAGNOSIS — R944 Abnormal results of kidney function studies: Secondary | ICD-10-CM

## 2020-08-26 LAB — COMPREHENSIVE METABOLIC PANEL
ALT: 15 IU/L (ref 0–44)
AST: 15 IU/L (ref 0–40)
Albumin/Globulin Ratio: 1.6 (ref 1.2–2.2)
Albumin: 4.2 g/dL (ref 3.7–4.7)
Alkaline Phosphatase: 97 IU/L (ref 44–121)
BUN/Creatinine Ratio: 22 (ref 10–24)
BUN: 70 mg/dL — ABNORMAL HIGH (ref 8–27)
Bilirubin Total: 0.3 mg/dL (ref 0.0–1.2)
CO2: 16 mmol/L — ABNORMAL LOW (ref 20–29)
Calcium: 8.5 mg/dL — ABNORMAL LOW (ref 8.6–10.2)
Chloride: 101 mmol/L (ref 96–106)
Creatinine, Ser: 3.17 mg/dL — ABNORMAL HIGH (ref 0.76–1.27)
Globulin, Total: 2.7 g/dL (ref 1.5–4.5)
Glucose: 77 mg/dL (ref 65–99)
Potassium: 4.9 mmol/L (ref 3.5–5.2)
Sodium: 134 mmol/L (ref 134–144)
Total Protein: 6.9 g/dL (ref 6.0–8.5)
eGFR: 20 mL/min/{1.73_m2} — ABNORMAL LOW (ref >59)

## 2020-08-26 LAB — CBC WITH DIFFERENTIAL/PLATELET
Basophils Absolute: 0 10*3/uL (ref 0.0–0.2)
Basos: 0 %
EOS (ABSOLUTE): 0.1 10*3/uL (ref 0.0–0.4)
Eos: 3 %
Hematocrit: 37.3 % — ABNORMAL LOW (ref 37.5–51.0)
Hemoglobin: 12.6 g/dL — ABNORMAL LOW (ref 13.0–17.7)
Immature Grans (Abs): 0 10*3/uL (ref 0.0–0.1)
Immature Granulocytes: 0 %
Lymphocytes Absolute: 1.5 10*3/uL (ref 0.7–3.1)
Lymphs: 44 %
MCH: 32.3 pg (ref 26.6–33.0)
MCHC: 33.8 g/dL (ref 31.5–35.7)
MCV: 96 fL (ref 79–97)
Monocytes Absolute: 0 10*3/uL — ABNORMAL LOW (ref 0.1–0.9)
Monocytes: 1 %
Neutrophils Absolute: 1.7 10*3/uL (ref 1.4–7.0)
Neutrophils: 52 %
Platelets: 73 10*3/uL — CL (ref 150–450)
RBC: 3.9 x10E6/uL — ABNORMAL LOW (ref 4.14–5.80)
RDW: 15.5 % — ABNORMAL HIGH (ref 11.6–15.4)
WBC: 3.4 10*3/uL (ref 3.4–10.8)

## 2020-08-26 LAB — THYROID PANEL WITH TSH
Free Thyroxine Index: 1.7 (ref 1.2–4.9)
T3 Uptake Ratio: 31 % (ref 24–39)
T4, Total: 5.5 ug/dL (ref 4.5–12.0)
TSH: 0.476 u[IU]/mL (ref 0.450–4.500)

## 2020-09-01 ENCOUNTER — Telehealth: Payer: Self-pay | Admitting: Family Medicine

## 2020-09-01 NOTE — Telephone Encounter (Signed)
Copied from [place] [id number]. Topic: Medicare AWV >> Sep 01, 2020  2:46 PM Cher Nakai R wrote: Reason for CRM:  No answer unable to leave a message for patient to call back and schedule Medicare Annual Wellness Visit (AWV) in office.   If unable to come into the office for AWV,  please offer to do virtually or by telephone.  Last AWV: 09/06/2017  Please schedule at anytime with [facility] Health Advisor.  40 minute appointment  Any questions, please contact me at [phone]

## 2020-09-02 ENCOUNTER — Encounter: Payer: Self-pay | Admitting: *Deleted

## 2020-09-04 DIAGNOSIS — H2513 Age-related nuclear cataract, bilateral: Secondary | ICD-10-CM | POA: Diagnosis not present

## 2020-09-04 LAB — HM DIABETES EYE EXAM

## 2020-09-05 DIAGNOSIS — Z20822 Contact with and (suspected) exposure to covid-19: Secondary | ICD-10-CM | POA: Diagnosis not present

## 2020-09-05 DIAGNOSIS — K429 Umbilical hernia without obstruction or gangrene: Secondary | ICD-10-CM | POA: Diagnosis not present

## 2020-09-05 DIAGNOSIS — R54 Age-related physical debility: Secondary | ICD-10-CM | POA: Diagnosis not present

## 2020-09-05 DIAGNOSIS — G928 Other toxic encephalopathy: Secondary | ICD-10-CM | POA: Diagnosis not present

## 2020-09-05 DIAGNOSIS — R410 Disorientation, unspecified: Secondary | ICD-10-CM | POA: Diagnosis not present

## 2020-09-05 DIAGNOSIS — R140 Abdominal distension (gaseous): Secondary | ICD-10-CM | POA: Diagnosis not present

## 2020-09-05 DIAGNOSIS — G8929 Other chronic pain: Secondary | ICD-10-CM | POA: Diagnosis not present

## 2020-09-05 DIAGNOSIS — D709 Neutropenia, unspecified: Secondary | ICD-10-CM | POA: Diagnosis not present

## 2020-09-05 DIAGNOSIS — E875 Hyperkalemia: Secondary | ICD-10-CM | POA: Diagnosis not present

## 2020-09-05 DIAGNOSIS — E1122 Type 2 diabetes mellitus with diabetic chronic kidney disease: Secondary | ICD-10-CM | POA: Diagnosis not present

## 2020-09-05 DIAGNOSIS — N179 Acute kidney failure, unspecified: Secondary | ICD-10-CM | POA: Diagnosis not present

## 2020-09-05 DIAGNOSIS — D649 Anemia, unspecified: Secondary | ICD-10-CM | POA: Diagnosis not present

## 2020-09-05 DIAGNOSIS — R5381 Other malaise: Secondary | ICD-10-CM | POA: Diagnosis not present

## 2020-09-05 DIAGNOSIS — M0689 Other specified rheumatoid arthritis, multiple sites: Secondary | ICD-10-CM | POA: Diagnosis not present

## 2020-09-05 DIAGNOSIS — K117 Disturbances of salivary secretion: Secondary | ICD-10-CM | POA: Diagnosis not present

## 2020-09-05 DIAGNOSIS — D539 Nutritional anemia, unspecified: Secondary | ICD-10-CM | POA: Diagnosis not present

## 2020-09-05 DIAGNOSIS — I959 Hypotension, unspecified: Secondary | ICD-10-CM | POA: Diagnosis not present

## 2020-09-05 DIAGNOSIS — D849 Immunodeficiency, unspecified: Secondary | ICD-10-CM | POA: Diagnosis not present

## 2020-09-05 DIAGNOSIS — I5032 Chronic diastolic (congestive) heart failure: Secondary | ICD-10-CM | POA: Diagnosis not present

## 2020-09-05 DIAGNOSIS — I130 Hypertensive heart and chronic kidney disease with heart failure and stage 1 through stage 4 chronic kidney disease, or unspecified chronic kidney disease: Secondary | ICD-10-CM | POA: Diagnosis not present

## 2020-09-05 DIAGNOSIS — R5383 Other fatigue: Secondary | ICD-10-CM | POA: Diagnosis not present

## 2020-09-05 DIAGNOSIS — F17210 Nicotine dependence, cigarettes, uncomplicated: Secondary | ICD-10-CM | POA: Diagnosis not present

## 2020-09-05 DIAGNOSIS — N189 Chronic kidney disease, unspecified: Secondary | ICD-10-CM | POA: Diagnosis not present

## 2020-09-05 DIAGNOSIS — G9340 Encephalopathy, unspecified: Secondary | ICD-10-CM | POA: Diagnosis not present

## 2020-09-05 DIAGNOSIS — R059 Cough, unspecified: Secondary | ICD-10-CM | POA: Diagnosis not present

## 2020-09-05 DIAGNOSIS — K121 Other forms of stomatitis: Secondary | ICD-10-CM | POA: Diagnosis not present

## 2020-09-05 DIAGNOSIS — R058 Other specified cough: Secondary | ICD-10-CM | POA: Diagnosis not present

## 2020-09-05 DIAGNOSIS — R5081 Fever presenting with conditions classified elsewhere: Secondary | ICD-10-CM | POA: Diagnosis not present

## 2020-09-05 DIAGNOSIS — G4733 Obstructive sleep apnea (adult) (pediatric): Secondary | ICD-10-CM | POA: Diagnosis not present

## 2020-09-05 DIAGNOSIS — E872 Acidosis: Secondary | ICD-10-CM | POA: Diagnosis not present

## 2020-09-05 DIAGNOSIS — J449 Chronic obstructive pulmonary disease, unspecified: Secondary | ICD-10-CM | POA: Diagnosis not present

## 2020-09-05 DIAGNOSIS — D72819 Decreased white blood cell count, unspecified: Secondary | ICD-10-CM | POA: Diagnosis not present

## 2020-09-06 ENCOUNTER — Other Ambulatory Visit: Payer: Self-pay | Admitting: Family Medicine

## 2020-09-06 DIAGNOSIS — R739 Hyperglycemia, unspecified: Secondary | ICD-10-CM

## 2020-09-06 NOTE — Telephone Encounter (Signed)
Last RF 06/26/20 # 90 too soon

## 2020-09-14 ENCOUNTER — Telehealth: Payer: Self-pay

## 2020-09-14 ENCOUNTER — Ambulatory Visit: Payer: Medicare Other

## 2020-09-14 NOTE — Telephone Encounter (Signed)
Transition Care Management Unsuccessful Follow-up Telephone Call  Date of discharge and from where:  09/13/20 [facility]  Attempts:  1st Attempt  Reason for unsuccessful TCM follow-up call:  Left voice message

## 2020-09-15 ENCOUNTER — Telehealth: Payer: Self-pay

## 2020-09-15 NOTE — Telephone Encounter (Signed)
Copied from [place] [id number]. Topic: Appointment Scheduling - Scheduling Inquiry for Clinic >> Sep 15, 2020 12:46 PM Erick Blinks wrote: Reason for CRM: Pt called reporting that he just left the hospital at [facility], he was there for a few weeks. Pt declined hosp fu appt today because he does not have transportation. Please advise, patient wants to have virtual appt with PCP.   Best contact: [phone]

## 2020-09-15 NOTE — Telephone Encounter (Signed)
Left voice mail to call back to set up hospital follow up

## 2020-09-18 DIAGNOSIS — M0579 Rheumatoid arthritis with rheumatoid factor of multiple sites without organ or systems involvement: Secondary | ICD-10-CM | POA: Diagnosis not present

## 2020-09-18 DIAGNOSIS — M5136 Other intervertebral disc degeneration, lumbar region: Secondary | ICD-10-CM | POA: Diagnosis not present

## 2020-09-18 DIAGNOSIS — D61818 Other pancytopenia: Secondary | ICD-10-CM | POA: Diagnosis not present

## 2020-09-18 DIAGNOSIS — N179 Acute kidney failure, unspecified: Secondary | ICD-10-CM | POA: Diagnosis not present

## 2020-09-18 DIAGNOSIS — Z79899 Other long term (current) drug therapy: Secondary | ICD-10-CM | POA: Diagnosis not present

## 2020-09-18 DIAGNOSIS — M170 Bilateral primary osteoarthritis of knee: Secondary | ICD-10-CM | POA: Diagnosis not present

## 2020-09-25 ENCOUNTER — Telehealth: Payer: Self-pay

## 2020-09-25 ENCOUNTER — Other Ambulatory Visit: Payer: Self-pay | Admitting: Family Medicine

## 2020-09-25 MED ORDER — ALBUTEROL SULFATE (2.5 MG/3ML) 0.083% IN NEBU: 2.5000 mg | INHALATION_SOLUTION | Freq: Four times a day (QID) | RESPIRATORY_TRACT | 0 refills | Status: DC | PRN

## 2020-09-25 NOTE — Telephone Encounter (Unsigned)
Copied from [place] [id number]. Topic: General - Other >> Sep 25, 2020 11:22 AM Bayard Beaver wrote: Reason for [id number] asking for temporary supply of albuterol until his refill comes in. Please call back

## 2020-09-25 NOTE — Telephone Encounter (Signed)
Pt is calling to request an inhaler. Please advise

## 2020-09-25 NOTE — Telephone Encounter (Signed)
Medication Refill - Medication: albuterol (PROVENTIL) (2.5 MG/3ML) 0.083% nebulizer solution  Has the patient contacted their pharmacy? yes (Agent: If no, request that the patient contact the pharmacy for the refill.) (Agent: If yes, when and what did the pharmacy advise?)  Preferred Pharmacy (with phone number or street name):  [place], [place] - [address] Phone:  [phone]  Fax:  [phone]      Agent: Please be advised that RX refills may take up to 3 business days. We ask that you follow-up with your pharmacy.

## 2020-09-25 NOTE — Telephone Encounter (Signed)
Requested medication (s) are due for refill today: no  Requested medication (s) are on the active medication list: yes   Last refill:  2/20222  Future visit scheduled: no  Notes to clinic:  Failed protocol: One inhaler should last at least one month. If the patient is requesting refills earlier, contact the patient to check for uncontrolled symptoms   Requested Prescriptions  Pending Prescriptions Disp Refills   albuterol (PROVENTIL) (2.5 MG/3ML) 0.083% nebulizer solution 75 mL 0    Sig: Take 3 mLs (2.5 mg total) by nebulization every 6 (six) hours as needed for wheezing or shortness of breath.      Pulmonology:  Beta Agonists Failed - 09/25/2020 11:23 AM      Failed - One inhaler should last at least one month. If the patient is requesting refills earlier, contact the patient to check for uncontrolled symptoms.      Passed - Valid encounter within last 12 months    Recent Outpatient Visits           1 month ago Chronic fatigue   [facility] Juline Patch, MD   3 months ago Essential (primary) hypertension   [facility] Juline Patch, MD   4 months ago Chronic obstructive pulmonary disease with acute exacerbation [facility])   [facility] Juline Patch, MD   11 months ago Colon cancer screening   [facility] Juline Patch, MD   2 years ago Chronic diastolic heart failure [facility])   [facility] Juline Patch, MD

## 2020-09-27 ENCOUNTER — Other Ambulatory Visit: Payer: Self-pay | Admitting: Family Medicine

## 2020-09-27 DIAGNOSIS — R739 Hyperglycemia, unspecified: Secondary | ICD-10-CM

## 2020-09-27 NOTE — Telephone Encounter (Signed)
Requested medication (s) are due for refill today: yes  Requested medication (s) are on the active medication list: yes  Last refill:  06/26/20 #90  Future visit scheduled: no  Notes to clinic:  elevated Cr dated 08/25/20 3.17- Per chart pt was going to be referred to nephrology- please review   Requested Prescriptions  Pending Prescriptions Disp Refills   lisinopril-hydrochlorothiazide (ZESTORETIC) 10-12.5 MG tablet [Pharmacy Med Name: Lisinopril-hydroCHLOROthiazide 10-12.5 MG Oral Tablet] 90 tablet 0    Sig: Take 1 tablet by mouth once daily      Cardiovascular:  ACEI + Diuretic Combos Failed - 09/27/2020  3:26 PM      Failed - Cr in normal range and within 180 days    Creatinine, Ser  Date Value Ref Range Status  08/25/2020 3.17 (H) 0.76 - 1.27 mg/dL Final          Failed - Ca in normal range and within 180 days    Calcium  Date Value Ref Range Status  08/25/2020 8.5 (L) 8.6 - 10.2 mg/dL Final          Passed - Na in normal range and within 180 days    Sodium  Date Value Ref Range Status  08/25/2020 134 134 - 144 mmol/L Final          Passed - K in normal range and within 180 days    Potassium  Date Value Ref Range Status  08/25/2020 4.9 3.5 - 5.2 mmol/L Final          Passed - Patient is not pregnant      Passed - Last BP in normal range    BP Readings from Last 1 Encounters:  08/25/20 128/76          Passed - Valid encounter within last 6 months    Recent Outpatient Visits           1 month ago Chronic fatigue   [facility] Juline Patch, MD   3 months ago Essential (primary) hypertension   [facility] Juline Patch, MD   4 months ago Chronic obstructive pulmonary disease with acute exacerbation [facility])   [facility] Juline Patch, MD   12 months ago Colon cancer screening   [facility] Juline Patch, MD   2 years ago Chronic diastolic heart failure [facility])   [facility] Juline Patch, MD

## 2020-09-28 ENCOUNTER — Other Ambulatory Visit: Payer: Self-pay

## 2020-09-28 DIAGNOSIS — J441 Chronic obstructive pulmonary disease with (acute) exacerbation: Secondary | ICD-10-CM

## 2020-09-28 MED ORDER — ALBUTEROL SULFATE HFA 108 (90 BASE) MCG/ACT IN AERS: INHALATION_SPRAY | RESPIRATORY_TRACT | 0 refills | Status: DC

## 2020-10-19 ENCOUNTER — Ambulatory Visit: Payer: Self-pay | Admitting: *Deleted

## 2020-10-19 DIAGNOSIS — R0602 Shortness of breath: Secondary | ICD-10-CM | POA: Diagnosis not present

## 2020-10-19 DIAGNOSIS — G4733 Obstructive sleep apnea (adult) (pediatric): Secondary | ICD-10-CM | POA: Diagnosis not present

## 2020-10-19 DIAGNOSIS — D72819 Decreased white blood cell count, unspecified: Secondary | ICD-10-CM | POA: Diagnosis not present

## 2020-10-19 DIAGNOSIS — R54 Age-related physical debility: Secondary | ICD-10-CM | POA: Diagnosis not present

## 2020-10-19 DIAGNOSIS — N179 Acute kidney failure, unspecified: Secondary | ICD-10-CM | POA: Diagnosis not present

## 2020-10-19 DIAGNOSIS — Z7952 Long term (current) use of systemic steroids: Secondary | ICD-10-CM | POA: Diagnosis not present

## 2020-10-19 DIAGNOSIS — D61818 Other pancytopenia: Secondary | ICD-10-CM | POA: Diagnosis not present

## 2020-10-19 DIAGNOSIS — Z87891 Personal history of nicotine dependence: Secondary | ICD-10-CM | POA: Diagnosis not present

## 2020-10-19 DIAGNOSIS — M069 Rheumatoid arthritis, unspecified: Secondary | ICD-10-CM | POA: Diagnosis not present

## 2020-10-19 DIAGNOSIS — E1142 Type 2 diabetes mellitus with diabetic polyneuropathy: Secondary | ICD-10-CM | POA: Diagnosis not present

## 2020-10-19 DIAGNOSIS — I499 Cardiac arrhythmia, unspecified: Secondary | ICD-10-CM | POA: Diagnosis not present

## 2020-10-19 DIAGNOSIS — Z20822 Contact with and (suspected) exposure to covid-19: Secondary | ICD-10-CM | POA: Diagnosis not present

## 2020-10-19 DIAGNOSIS — I110 Hypertensive heart disease with heart failure: Secondary | ICD-10-CM | POA: Diagnosis not present

## 2020-10-19 DIAGNOSIS — Z79899 Other long term (current) drug therapy: Secondary | ICD-10-CM | POA: Diagnosis not present

## 2020-10-19 DIAGNOSIS — R059 Cough, unspecified: Secondary | ICD-10-CM | POA: Diagnosis not present

## 2020-10-19 DIAGNOSIS — J441 Chronic obstructive pulmonary disease with (acute) exacerbation: Secondary | ICD-10-CM | POA: Diagnosis not present

## 2020-10-19 DIAGNOSIS — Z28310 Unvaccinated for covid-19: Secondary | ICD-10-CM | POA: Diagnosis not present

## 2020-10-19 DIAGNOSIS — I10 Essential (primary) hypertension: Secondary | ICD-10-CM | POA: Diagnosis not present

## 2020-10-19 DIAGNOSIS — I5032 Chronic diastolic (congestive) heart failure: Secondary | ICD-10-CM | POA: Diagnosis not present

## 2020-10-19 DIAGNOSIS — R9431 Abnormal electrocardiogram [ECG] [EKG]: Secondary | ICD-10-CM | POA: Diagnosis not present

## 2020-10-19 DIAGNOSIS — D721 Eosinophilia, unspecified: Secondary | ICD-10-CM | POA: Diagnosis not present

## 2020-10-19 DIAGNOSIS — F17200 Nicotine dependence, unspecified, uncomplicated: Secondary | ICD-10-CM | POA: Diagnosis not present

## 2020-10-19 NOTE — Telephone Encounter (Signed)
Pt with SOB.States "Nothing is working and I feel like I can't hold on." SOB with speaking, sounds distressed. Advised EMS. Offered to call for pt, states no he will do so. Reiterated need to call now, again offered to call. Stated he would call now. "Just wanted you all to know first that I was going to call them." Assured pt NT would route to practice for PCPs review.      Reason for Disposition  [1] MODERATE difficulty breathing (e.g., speaks in phrases, SOB even at rest, pulse 100-120) AND [2] NEW-onset or WORSE than normal  Answer Assessment - Initial Assessment Questions 1. RESPIRATORY STATUS: "Describe your breathing?" (e.g., wheezing, shortness of breath, unable to speak, severe coughing)      SOB 2. ONSET: "When did this breathing problem begin?"      *No Answer* 3. PATTERN "Does the difficult breathing come and go, or has it been constant since it started?"      *No Answer* 4. SEVERITY: "How bad is your breathing?" (e.g., mild, moderate, severe)    - MILD: No SOB at rest, mild SOB with walking, speaks normally in sentences, can lie down, no retractions, pulse < 100.    - MODERATE: SOB at rest, SOB with minimal exertion and prefers to sit, cannot lie down flat, speaks in phrases, mild retractions, audible wheezing, pulse 100-120.    - SEVERE: Very SOB at rest, speaks in single words, struggling to breathe, sitting hunched forward, retractions, pulse > 120      *No Answer* 5. RECURRENT SYMPTOM: "Have you had difficulty breathing before?" If Yes, ask: "When was the last time?" and "What happened that time?"      *No Answer* 6. CARDIAC HISTORY: "Do you have any history of heart disease?" (e.g., heart attack, angina, bypass surgery, angioplasty)      *No Answer* 7. LUNG HISTORY: "Do you have any history of lung disease?"  (e.g., pulmonary embolus, asthma, emphysema)     *No Answer* 8. CAUSE: "What do you think is causing the breathing problem?"      *No Answer* 9. OTHER SYMPTOMS:  "Do you have any other symptoms? (e.g., dizziness, runny nose, cough, chest pain, fever)     *No Answer* 10. O2 SATURATION MONITOR:  "Do you use an oxygen saturation monitor (pulse oximeter) at home?" If Yes, "What is your reading (oxygen level) today?" "What is your usual oxygen saturation reading?" (e.g., 95%)       *No Answer* 11. PREGNANCY: "Is there any chance you are pregnant?" "When was your last menstrual period?"       *No Answer* 12. TRAVEL: "Have you traveled out of the country in the last month?" (e.g., travel history, exposures)       *No Answer*  Protocols used: Breathing Difficulty-A-AH

## 2020-10-20 DIAGNOSIS — N179 Acute kidney failure, unspecified: Secondary | ICD-10-CM | POA: Diagnosis not present

## 2020-10-20 DIAGNOSIS — J441 Chronic obstructive pulmonary disease with (acute) exacerbation: Secondary | ICD-10-CM | POA: Diagnosis not present

## 2020-10-20 DIAGNOSIS — I5032 Chronic diastolic (congestive) heart failure: Secondary | ICD-10-CM | POA: Diagnosis not present

## 2020-10-20 DIAGNOSIS — D7219 Other eosinophilia: Secondary | ICD-10-CM | POA: Diagnosis not present

## 2020-10-20 DIAGNOSIS — M069 Rheumatoid arthritis, unspecified: Secondary | ICD-10-CM | POA: Diagnosis not present

## 2020-10-21 DIAGNOSIS — I10 Essential (primary) hypertension: Secondary | ICD-10-CM | POA: Diagnosis not present

## 2020-10-21 DIAGNOSIS — N179 Acute kidney failure, unspecified: Secondary | ICD-10-CM | POA: Diagnosis not present

## 2020-10-21 DIAGNOSIS — D7219 Other eosinophilia: Secondary | ICD-10-CM | POA: Diagnosis not present

## 2020-10-21 DIAGNOSIS — J441 Chronic obstructive pulmonary disease with (acute) exacerbation: Secondary | ICD-10-CM | POA: Diagnosis not present

## 2020-10-21 DIAGNOSIS — D696 Thrombocytopenia, unspecified: Secondary | ICD-10-CM | POA: Diagnosis not present

## 2020-10-22 ENCOUNTER — Telehealth: Payer: Self-pay

## 2020-10-22 NOTE — Telephone Encounter (Signed)
Transition Care Management Unsuccessful Follow-up Telephone Call  Date of discharge and from where:  10/21/20 [facility]  Attempts:  1st Attempt  Reason for unsuccessful TCM follow-up call:  Left voice message

## 2020-10-26 ENCOUNTER — Ambulatory Visit: Payer: Medicare Other | Admitting: Family Medicine

## 2020-11-20 ENCOUNTER — Other Ambulatory Visit: Payer: Self-pay

## 2020-11-20 DIAGNOSIS — Z1211 Encounter for screening for malignant neoplasm of colon: Secondary | ICD-10-CM

## 2020-11-20 NOTE — Progress Notes (Signed)
Ref gast

## 2020-11-22 ENCOUNTER — Ambulatory Visit (INDEPENDENT_AMBULATORY_CARE_PROVIDER_SITE_OTHER): Payer: Medicare Other | Admitting: *Deleted

## 2020-11-22 ENCOUNTER — Other Ambulatory Visit: Payer: Self-pay

## 2020-11-22 DIAGNOSIS — Z0000 Encounter for general adult medical examination without abnormal findings: Secondary | ICD-10-CM | POA: Diagnosis not present

## 2020-11-22 NOTE — Progress Notes (Signed)
Subjective:   Daniel Frye is a 70 y.o. male who presents for Medicare Annual/Subsequent preventive examination.  I connected with  Daniel Frye on 11/22/20 by a audio enabled telemedicine application and verified that I am speaking with the correct person using two identifiers.   I discussed the limitations of evaluation and management by telemedicine. The patient expressed understanding and agreed to proceed.   Location of patient: Home Location of staff: Office  People attending visit: Daniel Frye; Daniel Frye,RMA  Objective:    Today's Vitals   11/22/20 1055  PainSc: 0-No pain   There is no height or weight on file to calculate BMI.  Advanced Directives 11/22/2020 10/09/2019 09/04/2019 07/30/2019 07/30/2019 10/04/2018 09/13/2017  Does Patient Have a Medical Advance Directive? Yes No No;Yes No No No No  Type of [employer] [place];Living will - - - -  Does patient want to make changes to medical advance directive? No - Patient declined - - - - - -  Copy of [place] in Chart? No - copy requested - No - copy requested - - - -  Would patient like information on creating a medical advance directive? - - - No - Patient declined - No - Patient declined No - Patient declined    Current Medications (verified) Outpatient Encounter Medications as of 11/22/2020  Medication Sig   Adalimumab (HUMIRA PEN) 40 MG/0.4ML PNKT Inject into the skin.   albuterol (PROAIR HFA) 108 (90 Base) MCG/ACT inhaler INHALE 2 PUFFS BY MOUTH EVERY 4 HOURS AS NEEDED FOR  WHEEZING  OR  SHORTNESS  OF  BREATH   albuterol (PROVENTIL) (2.5 MG/3ML) 0.083% nebulizer solution Take 3 mLs (2.5 mg total) by nebulization every 6 (six) hours as needed for wheezing or shortness of breath.   folic acid (FOLVITE) 1 MG tablet Take 1 mg by mouth daily.   gabapentin (NEURONTIN) 100 MG capsule Take 1 capsule (100 mg total) by mouth 2 (two) times daily.    HYDROcodone-acetaminophen (NORCO/VICODIN) 5-325 MG tablet Take 1 tablet by mouth daily as needed. (Patient not taking: No sig reported)   lisinopril-hydrochlorothiazide (ZESTORETIC) 10-12.5 MG tablet Take 1 tablet by mouth once daily   methotrexate 2.5 MG tablet Take 8 tablets by mouth once a week.   No facility-administered encounter medications on file as of 11/22/2020.    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   Took shots for awhile but not now   Rheumatoid arthritis ([place])    Substance abuse ([place])    Tobacco use    Past Surgical History:  Procedure Laterality Date   heart surgery to drain fluid N/A 1987   Heart surgery to remove fluid    Family History  Problem Relation Age of Onset   COPD Brother    Arthritis Brother    Diabetes Brother    Hypertension Brother    Social History   Socioeconomic History   Marital status: Single    Spouse name: Not on file   Number of children: 2   Years of education: 8   Highest education level: 9th grade  Occupational History   Occupation: Retired  Tobacco Use   Smoking status: Every Day    Packs/day: 0.25    Years: 51.00    Pack years: 12.75    Types: Cigarettes, Cigars   Smokeless tobacco: Never  Vaping Use   Vaping Use: Never used  Substance and Sexual Activity   Alcohol use: Yes    Alcohol/week: 61.0 standard drinks    Types: 12 Cans of beer, 49 Standard drinks or equivalent per week    Comment: Drinks 1/5 day of wine    Drug use: Yes    Types: "Crack" cocaine, Marijuana    Comment: Uses only once in awhile and does not look for it but has hard time when it is there tunrning it down.    Sexual activity: Not  Currently  Other Topics Concern   Not on file  Social History Narrative   One of his children passed away 8 months ago from 20-Dec-2020   Social Determinants of Health   Financial Resource Strain: Low Risk    Difficulty of Paying Living Expenses: Not hard at all  Food Insecurity: No Food Insecurity   Worried About Charity fundraiser in the Last Year: Never true   Arboriculturist in the Last Year: Never true  Transportation Needs: No Transportation Needs   Lack of Transportation (Medical): No   Lack of Transportation (Non-Medical): No  Physical Activity: Inactive   Days of Exercise per Week: 0 days   Minutes of Exercise per Session: 0 min  Stress: No Stress Concern Present   Feeling of Stress : Not at all  Social Connections: Moderately Isolated   Frequency of Communication with Friends and Family: More than three times a week   Frequency of Social Gatherings with Friends and Family: Never   Attends Religious Services: More than 4 times per year   Active Member of [employer] or Organizations: No   Attends Music therapist: Never   Marital Status: Divorced    Tobacco Counseling Ready to quit: Yes Counseling given: Yes   Clinical Intake:   Pain : 0-10 Pain Score: 0-No pain   Nutritional Risks: None Diabetes: No (Per pt he is Borderline)  How often do you need to have someone help you when you read instructions, pamphlets, or other written materials from your doctor or pharmacy?: 1 - Never What is the last grade level you completed in school?: 8th  Diabetic? No, Per pt, PCP told him he is Borderline Diabetic   Activities of Daily Living In your present state of health, do you have any difficulty performing the following activities: 12/20/20 08/25/2020  Hearing? Y N  Vision? N N  Difficulty concentrating or making decisions? N N  Walking or climbing stairs? N Y  Dressing or bathing? N Y  Doing errands, shopping? N Y  Some recent data might be hidden     Patient Care Team: Juline Patch, MD as PCP - General (Family Medicine) Isaias Cowman, MD as Consulting Physician (Cardiology) Marlowe Sax, MD as Referring Physician (Rheumatology)  Indicate any recent Medical Services you may have received from other than [facility] providers in the past year (date may be approximate).     Assessment:   This is a routine wellness examination for [place].  Hearing/Vision screen Patient states he have not had testing for his hearing and would like to. For patient vision screening, patient states he recently had his vision checked by [facility] about 4-5  months ago.  Dietary issues and exercise activities discussed: Current Exercise Habits: The patient does not participate in regular exercise at present, Type of exercise: Other - see comments (Per pt he do not excercise at all), Intensity: Not Applicable, Exercise limited by: None identified   Goals Addressed   None   Depression Screen PHQ 2/9 Scores 11/22/2020 11/22/2020 08/25/2020 05/28/2020 10/02/2019 12/05/2017 09/13/2017  PHQ - 2 Score 0 0 3 0 2 0 1  PHQ- 9 Score 2 - '9 1 2 '$ 0 -    Fall Risk Fall Risk  11/22/2020 05/28/2020 10/02/2019 09/06/2017 01/30/2017  Falls in the past year? 0 0 0 No No  Number falls in past yr: 0 - - - -  Comment - - - - -  Injury with Fall? 0 - - - -  Comment - - - - -  Risk Factor Category  - - - - -  Risk for fall due to : Impaired mobility;Impaired balance/gait - Impaired balance/gait Impaired vision;Impaired balance/gait;Medication side effect;Other (Comment);History of fall(s) -  Follow up Falls evaluation completed Falls evaluation completed Falls evaluation completed - -    FALL RISK PREVENTION PERTAINING TO THE HOME:  Any stairs in or around the home? No  If so, are there any without handrails? No  Home free of loose throw rugs in walkways, pet beds, electrical cords, etc? No  Adequate lighting in your home to reduce risk of falls? Yes    ASSISTIVE DEVICES UTILIZED TO PREVENT FALLS:  Life alert? No  Use of a cane, walker or w/c? Yes  Grab bars in the bathroom? Yes  Shower chair or bench in shower? Yes  Elevated toilet seat or a handicapped toilet? Yes    Cognitive Function:   6CIT Screen 11/22/2020 09/06/2017  What Year? 0 points 0 points  What month? 0 points 0 points  What time? 0 points 3 points  Count back from 20 0 points 0 points  Months in reverse 2 points 4 points  Repeat phrase 0 points 2 points  Total Score 2 9    Immunizations Immunization History  Administered Date(s) Administered   Influenza, High Dose Seasonal PF 12/05/2017   Influenza,inj,Quad PF,6+ Mos 11/26/2014   Influenza-Unspecified 12/06/2015, 12/07/2019   PPD Test 02/22/2008   Pneumococcal Conjugate-13 09/06/2017    TDAP status: Due, Education has been provided regarding the importance of this vaccine. Advised may receive this vaccine at local pharmacy or Health Dept. Aware to provide a copy of the vaccination record if obtained from local pharmacy or Health Dept. Verbalized acceptance and understanding.  Flu Vaccine status: Due, Education has been provided regarding the importance of this vaccine. Advised may receive this vaccine at local pharmacy or Health Dept. Aware to provide a copy of the vaccination record if obtained from local pharmacy or Health Dept. Verbalized acceptance and understanding.  Pneumococcal vaccine status: Due, Education has been provided regarding the importance of this vaccine. Advised may receive this vaccine at local pharmacy or Health Dept. Aware to provide a copy of the vaccination record if obtained from local pharmacy or Health Dept. Verbalized acceptance and understanding.  Covid-19 vaccine status: Information provided on how to obtain vaccines.   Qualifies for Shingles Vaccine? Yes   Zostavax completed No  1st of 2 completed 11/25/2020 Shingrix Completed?: No.    Education has been provided regarding the  importance of this vaccine. Patient has been advised to call insurance company to determine out of pocket expense if they have not  yet received this vaccine. Advised may also receive vaccine at local pharmacy or Health Dept. Verbalized acceptance and understanding.  Screening Tests Health Maintenance  Topic Date Due   TETANUS/TDAP  Never done   COLONOSCOPY (Pts 45-46yr Insurance coverage will need to be confirmed)  Never done   FOOT EXAM  04/07/2020   INFLUENZA VACCINE  10/05/2020   Zoster Vaccines- Shingrix (1 of 2) 11/25/2020 (Originally 07/31/1969)   HEMOGLOBIN A1C  11/28/2020   OPHTHALMOLOGY EXAM  09/04/2021   Hepatitis C Screening  Completed   HPV VACCINES  Aged Out   COVID-19 Vaccine  Discontinued    Health Maintenance  Health Maintenance Due  Topic Date Due   TETANUS/TDAP  Never done   COLONOSCOPY (Pts 45-474yrInsurance coverage will need to be confirmed)  Never done   FOOT EXAM  04/07/2020   INFLUENZA VACCINE  10/05/2020    Colorectal Cancer Screening: Patient have not completed. Patient will reach out to PCP to schedule.  Lung Cancer Screening: (Low Dose CT Chest recommended if Age 72-80ears, 30 pack-year currently smoking OR have quit w/in 15years.) does qualify.   Lung Cancer Screening Referral: Patient will reach out  Additional Screening:  Hepatitis C Screening: does qualify; Completed 09/08/2017  Vision Screening: Recommended annual ophthalmology exams for early detection of glaucoma and other disorders of the eye. Is the patient up to date with their annual eye exam?  Yes  Who is the provider or what is the name of the office in which the patient attends annual eye exams? [facility] [place] is not established with a provider, would they like to be referred to a provider to establish care? No .   Dental Screening: Recommended annual dental exams for proper oral hygiene  Community Resource Referral / Chronic Care Management: CRR required  this visit?  No   CCM required this visit?  No      Plan:     I have personally reviewed and noted the following in the patient's chart:   Medical and social history Use of alcohol, tobacco or illicit drugs  Current medications and supplements including opioid prescriptions. Patient is not currently taking opioid prescriptions. Functional ability and status Nutritional status Physical activity Advanced directives List of other physicians Hospitalizations, surgeries, and ER visits in previous 12 months Vitals Screenings to include cognitive, depression, and falls Referrals and appointments  In addition, I have reviewed and discussed with patient certain preventive protocols, quality metrics, and best practice recommendations. A written personalized care plan for preventive services as well as general preventive health recommendations were provided to patient.     [place]ilio [place] [id number] Nurse Notes: None face to face, [id number] Completed patient visit via telephone. Patient verbalized understanding during entire visit. No one attended/helped patient with his visit. Staff informed patient to contact PCP office to get vaccines and screenings that are needing to be completed. Patient verbalized understanding and agreed to reach out to his PCP office   Patient stated that [facility] he have a walker, wheelchair  and cane, he uses his walker more.   Patient verbalized difficulties in hearing and stated he have not had a hearing test recently or even at all.   Per pt he had 2 children but Covid killed one about 8 months ago and at the time he was mentally struggling but  he's better now.  Per pt he do not exercise at all

## 2020-11-24 ENCOUNTER — Telehealth: Payer: Self-pay | Admitting: Family Medicine

## 2020-11-24 NOTE — Telephone Encounter (Signed)
Referral Request - Has patient seen PCP for this complaint? no *If NO, is insurance requiring patient see PCP for this issue before PCP can refer them?no Referral for which specialty: colonoscopy Preferred provider/office: /?A Reason for referral: routine colonoscopy

## 2020-11-25 ENCOUNTER — Ambulatory Visit: Payer: Medicare Other | Admitting: Internal Medicine

## 2020-11-25 ENCOUNTER — Telehealth: Payer: Self-pay

## 2020-11-25 NOTE — Telephone Encounter (Signed)
CALLED PATIENT NO ANSWER LEFT VOICEMAIL

## 2020-11-26 ENCOUNTER — Telehealth: Payer: Self-pay

## 2020-11-26 NOTE — Telephone Encounter (Signed)
Return call. Ready to schedule colonoscopy

## 2020-11-30 ENCOUNTER — Ambulatory Visit: Payer: Medicare Other | Admitting: Family Medicine

## 2020-11-30 ENCOUNTER — Ambulatory Visit: Payer: Medicare Other

## 2020-11-30 ENCOUNTER — Other Ambulatory Visit (INDEPENDENT_AMBULATORY_CARE_PROVIDER_SITE_OTHER): Payer: Self-pay

## 2020-11-30 DIAGNOSIS — Z1211 Encounter for screening for malignant neoplasm of colon: Secondary | ICD-10-CM

## 2020-11-30 MED ORDER — PEG 3350-KCL-NA BICARB-NACL 420 G PO SOLR: 4000.0000 mL | Freq: Once | ORAL | 0 refills | Status: AC

## 2020-11-30 NOTE — Progress Notes (Signed)
Gastroenterology Pre-Procedure Review  Request Date: 12/31/2020 Requesting Physician: Dr. Allen Norris  PATIENT REVIEW QUESTIONS: The patient responded to the following health history questions as indicated:    1. Are you having any GI issues? no 2. Do you have a personal history of Polyps? no 3. Do you have a family history of Colon Cancer or Polyps? no 4. Diabetes Mellitus? no 5. Joint replacements in the past 12 months?no 6. Major health problems in the past 3 months?yes (BREATHING WENT TO HOSPITAL GOT A NEW [place]) 7. Any artificial heart valves, MVP, or defibrillator?no    MEDICATIONS & ALLERGIES:    Patient reports the following regarding taking any anticoagulation/antiplatelet therapy:   Plavix, Coumadin, Eliquis, Xarelto, Lovenox, Pradaxa, Brilinta, or Effient? no Aspirin? no  Patient confirms/reports the following medications:  Current Outpatient Medications  Medication Sig Dispense Refill   Adalimumab (HUMIRA PEN) 40 MG/0.4ML PNKT Inject into the skin.     albuterol (PROAIR HFA) 108 (90 Base) MCG/ACT inhaler INHALE 2 PUFFS BY MOUTH EVERY 4 HOURS AS NEEDED FOR  WHEEZING  OR  SHORTNESS  OF  BREATH 27 g 0   albuterol (PROVENTIL) (2.5 MG/3ML) 0.083% nebulizer solution Take 3 mLs (2.5 mg total) by nebulization every 6 (six) hours as needed for wheezing or shortness of breath. 75 mL 0   folic acid (FOLVITE) 1 MG tablet Take 1 mg by mouth daily.     gabapentin (NEURONTIN) 100 MG capsule Take 1 capsule (100 mg total) by mouth 2 (two) times daily. 90 capsule 3   lisinopril-hydrochlorothiazide (ZESTORETIC) 10-12.5 MG tablet Take 1 tablet by mouth once daily 90 tablet 0   methotrexate 2.5 MG tablet Take 8 tablets by mouth once a week.     No current facility-administered medications for this visit.    Patient confirms/reports the following allergies:  Allergies  Allergen Reactions   Renflexis [Infliximab] Anaphylaxis    No orders of the defined types were placed in this  encounter.   AUTHORIZATION INFORMATION Primary Insurance: 1D#: Group #:  Secondary Insurance: 1D#: Group #:  SCHEDULE INFORMATION: Date:  12/31/2020 Time: Location: [place]

## 2020-12-18 DIAGNOSIS — I5032 Chronic diastolic (congestive) heart failure: Secondary | ICD-10-CM | POA: Diagnosis not present

## 2020-12-18 DIAGNOSIS — Z720 Tobacco use: Secondary | ICD-10-CM | POA: Diagnosis not present

## 2020-12-18 DIAGNOSIS — G4733 Obstructive sleep apnea (adult) (pediatric): Secondary | ICD-10-CM | POA: Diagnosis not present

## 2020-12-18 DIAGNOSIS — I110 Hypertensive heart disease with heart failure: Secondary | ICD-10-CM | POA: Diagnosis not present

## 2020-12-18 DIAGNOSIS — E119 Type 2 diabetes mellitus without complications: Secondary | ICD-10-CM | POA: Diagnosis not present

## 2020-12-18 DIAGNOSIS — J441 Chronic obstructive pulmonary disease with (acute) exacerbation: Secondary | ICD-10-CM | POA: Diagnosis not present

## 2020-12-18 DIAGNOSIS — F17210 Nicotine dependence, cigarettes, uncomplicated: Secondary | ICD-10-CM | POA: Diagnosis not present

## 2020-12-18 DIAGNOSIS — J449 Chronic obstructive pulmonary disease, unspecified: Secondary | ICD-10-CM | POA: Diagnosis not present

## 2020-12-30 ENCOUNTER — Encounter: Payer: Self-pay | Admitting: Gastroenterology

## 2020-12-31 ENCOUNTER — Ambulatory Visit: Payer: Medicare Other | Admitting: Certified Registered"

## 2020-12-31 ENCOUNTER — Ambulatory Visit
Admission: RE | Admit: 2020-12-31 | Discharge: 2020-12-31 | Disposition: A | Discharge status: Home / Self Care | Payer: Medicare Other | Attending: Gastroenterology | Admitting: Gastroenterology

## 2020-12-31 ENCOUNTER — Encounter
Admission: RE | Disposition: A | Discharge status: Home / Self Care | Payer: Self-pay | Source: Home / Self Care | Attending: Gastroenterology

## 2020-12-31 ENCOUNTER — Encounter: Payer: Self-pay | Admitting: Gastroenterology

## 2020-12-31 DIAGNOSIS — K641 Second degree hemorrhoids: Secondary | ICD-10-CM | POA: Diagnosis not present

## 2020-12-31 DIAGNOSIS — K635 Polyp of colon: Secondary | ICD-10-CM

## 2020-12-31 DIAGNOSIS — Z79899 Other long term (current) drug therapy: Secondary | ICD-10-CM | POA: Insufficient documentation

## 2020-12-31 DIAGNOSIS — Z1211 Encounter for screening for malignant neoplasm of colon: Secondary | ICD-10-CM | POA: Diagnosis not present

## 2020-12-31 DIAGNOSIS — Z7960 Long term (current) use of unspecified immunomodulators and immunosuppressants: Secondary | ICD-10-CM | POA: Diagnosis not present

## 2020-12-31 DIAGNOSIS — D123 Benign neoplasm of transverse colon: Secondary | ICD-10-CM | POA: Diagnosis not present

## 2020-12-31 DIAGNOSIS — D126 Benign neoplasm of colon, unspecified: Secondary | ICD-10-CM | POA: Diagnosis not present

## 2020-12-31 DIAGNOSIS — K5730 Diverticulosis of large intestine without perforation or abscess without bleeding: Secondary | ICD-10-CM | POA: Diagnosis not present

## 2020-12-31 DIAGNOSIS — D124 Benign neoplasm of descending colon: Secondary | ICD-10-CM | POA: Diagnosis not present

## 2020-12-31 DIAGNOSIS — F1491 Cocaine use, unspecified, in remission: Secondary | ICD-10-CM

## 2020-12-31 DIAGNOSIS — Z888 Allergy status to other drugs, medicaments and biological substances status: Secondary | ICD-10-CM | POA: Diagnosis not present

## 2020-12-31 DIAGNOSIS — F17210 Nicotine dependence, cigarettes, uncomplicated: Secondary | ICD-10-CM | POA: Diagnosis not present

## 2020-12-31 DIAGNOSIS — F32A Depression, unspecified: Secondary | ICD-10-CM | POA: Diagnosis not present

## 2020-12-31 HISTORY — PX: COLONOSCOPY WITH PROPOFOL: SHX5780

## 2020-12-31 LAB — URINE DRUG SCREEN, QUALITATIVE (ARMC ONLY)
Amphetamines, Ur Screen: NOT DETECTED
Barbiturates, Ur Screen: NOT DETECTED
Benzodiazepine, Ur Scrn: NOT DETECTED
Cannabinoid 50 Ng, Ur ~~LOC~~: POSITIVE — AB
Cocaine Metabolite,Ur ~~LOC~~: NOT DETECTED
MDMA (Ecstasy)Ur Screen: NOT DETECTED
Methadone Scn, Ur: NOT DETECTED
Opiate, Ur Screen: NOT DETECTED
Phencyclidine (PCP) Ur S: NOT DETECTED
Tricyclic, Ur Screen: NOT DETECTED

## 2020-12-31 SURGERY — COLONOSCOPY WITH PROPOFOL
Anesthesia: General

## 2020-12-31 MED ORDER — PROPOFOL 10 MG/ML IV BOLUS
INTRAVENOUS | Status: DC | PRN
  Administered 2020-12-31: 70 mg via INTRAVENOUS

## 2020-12-31 MED ORDER — PROPOFOL 500 MG/50ML IV EMUL
INTRAVENOUS | Status: DC | PRN
  Administered 2020-12-31: 120 ug/kg/min via INTRAVENOUS

## 2020-12-31 MED ORDER — SODIUM CHLORIDE 0.9 % IV SOLN: INTRAVENOUS | Status: DC

## 2020-12-31 MED ORDER — LIDOCAINE 2% (20 MG/ML) 5 ML SYRINGE
INTRAMUSCULAR | Status: DC | PRN
  Administered 2020-12-31: 25 mg via INTRAVENOUS

## 2020-12-31 NOTE — H&P (Signed)
Daniel Lame, MD [address] [place], [place] [id number] Phone: [phone] Fax : [phone]  Primary Care Physician:  Juline Patch, MD Primary Gastroenterologist:  Dr. Allen Norris  Pre-Procedure History & Physical: HPI:  Daniel Frye is a 70 y.o. male is here for a screening colonoscopy.   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 Authorizing Provider  Adalimumab (HUMIRA PEN) 40 MG/0.4ML PNKT Inject into the skin. 06/03/20  Yes [provider]  albuterol (PROAIR HFA) 108 (90 Base) MCG/ACT inhaler INHALE 2 PUFFS BY MOUTH EVERY 4 HOURS AS NEEDED FOR  WHEEZING  OR  SHORTNESS  OF  BREATH 09/28/20  Yes Juline Patch, MD  albuterol (PROVENTIL) (2.5 MG/3ML) 0.083% nebulizer solution Take 3 mLs (2.5 mg total) by nebulization every 6 (six) hours as needed for wheezing or shortness of breath. 09/25/20  Yes Juline Patch, MD  folic acid (FOLVITE) 1 MG tablet Take 1 mg by mouth daily. 07/09/19  Yes [provider]  gabapentin (NEURONTIN) 100 MG capsule Take 1 capsule (100 mg total) by mouth 2 (two) times daily. 05/28/20  Yes Juline Patch, MD  lisinopril-hydrochlorothiazide (ZESTORETIC) 10-12.5 MG tablet Take 1 tablet by mouth once daily 09/28/20  Yes Juline Patch, MD  TRELEGY ELLIPTA 100-62.5-25 MCG/INH AEPB SMARTSIG:1 Via Inhaler Daily PRN 10/21/20  Yes [provider]  methotrexate 2.5 MG tablet Take 8 tablets by mouth once a week. Patient not taking: Reported on 12/31/2020 06/23/19   [provider]    Allergies as of 12/01/2020 - Review Complete 11/30/2020  Allergen Reaction Noted   Renflexis [infliximab] Anaphylaxis 07/30/2019    Family History  Problem Relation Age of Onset   COPD Brother    Arthritis Brother    Diabetes Brother    Hypertension Brother     Social History   Socioeconomic History   Marital status: Single    Spouse name: Not on file   Number of children: 2   Years of education: 8   Highest education level: 9th grade  Occupational History   Occupation: Retired  Tobacco Use   Smoking status: Every Day    Packs/day: 0.25    Years: 51.00    Pack years: 12.75    Types: Cigarettes, Cigars   Smokeless tobacco: Never  Vaping Use   Vaping Use: Never used  Substance and Sexual Activity   Alcohol use: Yes    Alcohol/week: 61.0 standard drinks    Types: 12 Cans of beer, 49 Standard drinks or equivalent per week    Comment: Drinks 1/5 day of wine    Drug use: Yes    Types: "Crack" cocaine, Marijuana    Comment: no drugs since 6 months   Sexual activity: Not  Currently  Other Topics Concern   Not on file  Social History Narrative   One of his children passed away 8 months ago from 12-11-2020   Social Determinants of Health   Financial Resource Strain: Low Risk    Difficulty of Paying Living Expenses: Not hard at all  Food Insecurity: No Food Insecurity   Worried About Charity fundraiser in the Last Year: Never true   Arboriculturist in the Last Year: Never true  Transportation Needs: No Transportation Needs   Lack of Transportation (Medical): No   Lack of Transportation (Non-Medical): No  Physical Activity: Inactive   Days of Exercise per Week: 0 days   Minutes of Exercise per Session: 0 min  Stress: No Stress Concern Present   Feeling of Stress : Not at all  Social Connections:  Moderately Isolated   Frequency of Communication with Friends and Family: More than three times a week   Frequency of Social Gatherings with Friends and Family: Never   Attends Religious Services: More than 4 times per year   Active Member of [employer] or Organizations: No   Attends Music therapist: Never   Marital Status: Divorced  Human resources officer Violence: Not At Risk   Fear of Current or Ex-Partner: No   Emotionally Abused: No   Physically Abused: No   Sexually Abused: No    Review of Systems: See HPI, otherwise negative ROS  Physical Exam: BP (!) 181/102   Pulse 71   Temp (!) 96.6 F (35.9 C) (Temporal)   Resp 18   Ht 5\' 10"  (1.778 m)   Wt 81.4 kg   SpO2 100%   BMI 25.74 kg/m  General:   Alert,  pleasant and cooperative in NAD Head:  Normocephalic and atraumatic. Neck:  Supple; no masses or thyromegaly. Lungs:  Clear throughout to auscultation.    Heart:  Regular rate and rhythm. Abdomen:  Soft, nontender and nondistended. Normal bowel sounds, without guarding, and without rebound.   Neurologic:  Alert and  oriented x4;  grossly normal neurologically.  Impression/Plan: Daniel Frye is now here to undergo a screening colonoscopy.  Risks, benefits, and alternatives regarding colonoscopy have been reviewed with the patient.  Questions have been answered.  All parties agreeable.

## 2020-12-31 NOTE — Anesthesia Preprocedure Evaluation (Signed)
Anesthesia Evaluation  Patient identified by MRN, date of birth, ID band Patient awake    Reviewed: Allergy & Precautions, NPO status , Patient's Chart, lab work & pertinent test results  Airway Mallampati: II  TM Distance: >3 FB Neck ROM: full    Dental  (+) Upper Dentures, Lower Dentures   Pulmonary neg pulmonary ROS, sleep apnea , COPD,  COPD inhaler, Current Smoker,    Pulmonary exam normal  + decreased breath sounds      Cardiovascular hypertension, Pt. on medications +CHF  negative cardio ROS Normal cardiovascular exam Rhythm:Regular Rate:Normal     Neuro/Psych Depression negative neurological ROS  negative psych ROS   GI/Hepatic negative GI ROS, Neg liver ROS,   Endo/Other  negative endocrine ROSdiabetes, Type 2  Renal/GU negative Renal ROS  negative genitourinary   Musculoskeletal   Abdominal Normal abdominal exam  (+)   Peds negative pediatric ROS (+)  Hematology negative hematology ROS (+)   Anesthesia Other Findings Past Medical History: No date: Alcohol abuse No date: CHF (congestive heart failure) (HCC) No date: Cocaine use No date: COPD (chronic obstructive pulmonary disease) (HCC) No date: Depression No date: Diabetes mellitus without complication (HCC) No date: Hypertension No date: Marijuana use 1989: Rheumatoid arteritis (HCC)     Comment:  Took shots for awhile but not now No date: Rheumatoid arthritis ([place]) No date: Substance abuse ([place]) No date: Tobacco use  Past Surgical History: No date: BACK SURGERY 03/07/1985: heart surgery to drain fluid; N/A     Comment:  Heart surgery to remove fluid   BMI    Body Mass Index: 25.74 kg/m      Reproductive/Obstetrics negative OB ROS                             Anesthesia Physical Anesthesia Plan  ASA: 3  Anesthesia Plan: General   Post-op Pain Management:    Induction: Intravenous  PONV Risk Score and  Plan: Propofol infusion and TIVA  Airway Management Planned: Nasal Cannula  Additional Equipment:   Intra-op Plan:   Post-operative Plan:   Informed Consent: I have reviewed the patients History and Physical, chart, labs and discussed the procedure including the risks, benefits and alternatives for the proposed anesthesia with the patient or authorized representative who has indicated his/her understanding and acceptance.     Dental Advisory Given  Plan Discussed with: CRNA and Surgeon  Anesthesia Plan Comments:         Anesthesia Quick Evaluation

## 2020-12-31 NOTE — Transfer of Care (Signed)
Immediate Anesthesia Transfer of Care Note  Patient: Daniel Frye  Procedure(s) Performed: COLONOSCOPY WITH PROPOFOL  Patient Location: Endoscopy Unit  Anesthesia Type:General  Level of Consciousness: drowsy  Airway & Oxygen Therapy: Patient Spontanous Breathing  Post-op Assessment: Report given to RN and Post -op Vital signs reviewed and stable  Post vital signs: Reviewed  Last Vitals:  Vitals Value Taken Time  BP 134/83 12/31/20 1124  Temp 36 C 12/31/20 1124  Pulse 81 12/31/20 1125  Resp 15 12/31/20 1125  SpO2 99 % 12/31/20 1125  Vitals shown include unvalidated device data.  Last Pain:  Vitals:   12/31/20 1124  TempSrc: Temporal  PainSc:          Complications: No notable events documented.

## 2020-12-31 NOTE — Anesthesia Postprocedure Evaluation (Signed)
Anesthesia Post Note  Patient: Daniel Frye  Procedure(s) Performed: COLONOSCOPY WITH PROPOFOL  Patient location during evaluation: PACU Anesthesia Type: General Level of consciousness: awake and oriented Pain management: pain level controlled Vital Signs Assessment: post-procedure vital signs reviewed and stable Respiratory status: spontaneous breathing and respiratory function stable Cardiovascular status: blood pressure returned to baseline Anesthetic complications: no   No notable events documented.   Last Vitals:  Vitals:   12/31/20 1134 12/31/20 1144  BP: (!) 152/117   Pulse: 71 67  Resp: 18 18  Temp:    SpO2: 100% 97%    Last Pain:  Vitals:   12/31/20 1124  TempSrc: Temporal  PainSc: Asleep                 VAN STAVEREN,Cheral Cappucci

## 2020-12-31 NOTE — Op Note (Signed)
[facility] Gastroenterology Patient Name: Daniel Frye Procedure Date: 12/31/2020 10:58 AM MRN: [id number] Account #: [id number] Date of Birth: 1950-05-13 Admit Type: Outpatient Age: 70 Room: [facility] ENDO ROOM 4 Gender: Male Note Status: Finalized Instrument Name: Park Meo [id number] Procedure:             Colonoscopy Indications:           Screening for colorectal malignant neoplasm Providers:             [name] MD, MD Referring MD:          Juline Patch, MD (Referring MD) Medicines:             Propofol per Anesthesia Complications:         No immediate complications. Procedure:             Pre-Anesthesia Assessment:                        - Prior to the procedure, a History and Physical was                         performed, and patient medications and allergies were                         reviewed. The patient's tolerance of previous                         anesthesia was also reviewed. The risks and benefits                         of the procedure and the sedation options and risks                         were discussed with the patient. All questions were                         answered, and informed consent was obtained. Prior                         Anticoagulants: The patient has taken no previous                         anticoagulant or antiplatelet agents. ASA Grade                         Assessment: II - A patient with mild systemic disease.                         After reviewing the risks and benefits, the patient                         was deemed in satisfactory condition to undergo the                         procedure.                        After obtaining informed consent, the colonoscope was  passed under direct vision. Throughout the procedure,                         the patient's blood pressure, pulse, and oxygen                         saturations were monitored continuously. The                          Colonoscope was introduced through the anus and                         advanced to the the cecum, identified by appendiceal                         orifice and ileocecal valve. The colonoscopy was                         performed without difficulty. The patient tolerated                         the procedure well. The quality of the bowel                         preparation was excellent. Findings:      The perianal and digital rectal examinations were normal.      A 6 mm polyp was found in the transverse colon. The polyp was sessile.       The polyp was removed with a cold snare. Resection and retrieval were       complete.      A 5 mm polyp was found in the descending colon. The polyp was sessile.       The polyp was removed with a cold snare. Resection and retrieval were       complete.      Multiple small-mouthed diverticula were found in the entire colon.      Non-bleeding internal hemorrhoids were found during retroflexion. The       hemorrhoids were Grade II (internal hemorrhoids that prolapse but reduce       spontaneously). Impression:            - One 6 mm polyp in the transverse colon, removed with                         a cold snare. Resected and retrieved.                        - One 5 mm polyp in the descending colon, removed with                         a cold snare. Resected and retrieved.                        - Diverticulosis in the entire examined colon.                        - Non-bleeding internal hemorrhoids. Recommendation:        - Discharge patient to home.                        -  Resume previous diet.                        - Continue present medications.                        - Await pathology results. Procedure Code(s):     --- Professional ---                        [phone], Colonoscopy, flexible; with removal of                         tumor(s), polyp(s), or other lesion(s) by snare                         technique Diagnosis Code(s):     ---  Professional ---                        Z12.11, Encounter for screening for malignant neoplasm                         of colon                        K63.5, Polyp of colon CPT copyright 2019 American Medical Association. All rights reserved. The codes documented in this report are preliminary and upon coder review may  be revised to meet current compliance requirements. [name] MD, MD 12/31/2020 11:25:05 AM This report has been signed electronically. Number of Addenda: 0 Note Initiated On: 12/31/2020 10:58 AM Scope Withdrawal Time: 0 hours 7 minutes 52 seconds  Total Procedure Duration: 0 hours 11 minutes 33 seconds  Estimated Blood Loss:  Estimated blood loss: none.      [facility]

## 2021-01-01 ENCOUNTER — Encounter: Payer: Medicare Other | Attending: Internal Medicine | Admitting: *Deleted

## 2021-01-01 ENCOUNTER — Encounter: Payer: Self-pay | Admitting: *Deleted

## 2021-01-01 ENCOUNTER — Encounter: Payer: Self-pay | Admitting: Gastroenterology

## 2021-01-01 ENCOUNTER — Other Ambulatory Visit: Payer: Self-pay

## 2021-01-01 DIAGNOSIS — J449 Chronic obstructive pulmonary disease, unspecified: Secondary | ICD-10-CM

## 2021-01-01 LAB — SURGICAL PATHOLOGY

## 2021-01-01 NOTE — Progress Notes (Signed)
Virtual orientation call completed today. he has an appointment on Date: 01/07/2021  for EP eval and gym Orientation.  Documentation of diagnosis can be found in [facility]  Date: 10/14/2022[name] is a current tobacco user. Intervention for tobacco cessation was provided at the initial medical review. He was asked about readiness to quit and reported he is ready to quit . Patient was advised and educated about tobacco cessation using combination therapy, tobacco cessation classes, quit line, and quit smoking apps. Patient demonstrated understanding of this material. Staff will continue to provide encouragement and follow up with the patient throughout the program.

## 2021-01-07 ENCOUNTER — Other Ambulatory Visit: Payer: Self-pay

## 2021-01-07 ENCOUNTER — Encounter: Payer: Medicare Other | Attending: Internal Medicine | Admitting: *Deleted

## 2021-01-07 VITALS — Ht 68.5 in | Wt 187.5 lb

## 2021-01-07 DIAGNOSIS — J449 Chronic obstructive pulmonary disease, unspecified: Secondary | ICD-10-CM | POA: Insufficient documentation

## 2021-01-07 NOTE — Progress Notes (Signed)
Pulmonary Individual Treatment Plan  Patient Details  Name: Daniel Frye MRN: [id number] Date of Birth: [birth year] Referring Provider:   Flowsheet Row Pulmonary Rehab from 01/07/2021 in [facility] Cardiac and Pulmonary Rehab  Referring Provider Launa Flight       Initial Encounter Date:  Flowsheet Row Pulmonary Rehab from 01/07/2021 in [facility] Cardiac and Pulmonary Rehab  Date 01/07/21       Visit Diagnosis: Chronic obstructive pulmonary disease, unspecified COPD type ([place])  Patient's Home Medications on Admission:  Current Outpatient Medications:    Adalimumab (HUMIRA PEN) 40 MG/0.4ML PNKT, Inject into the skin., Disp: , Rfl:    albuterol (PROAIR HFA) 108 (90 Base) MCG/ACT inhaler, INHALE 2 PUFFS BY MOUTH EVERY 4 HOURS AS NEEDED FOR  WHEEZING  OR  SHORTNESS  OF  BREATH, Disp: 27 g, Rfl: 0   albuterol (PROVENTIL) (2.5 MG/3ML) 0.083% nebulizer solution, Take 3 mLs (2.5 mg total) by nebulization every 6 (six) hours as needed for wheezing or shortness of breath., Disp: 75 mL, Rfl: 0   Fluticasone-Umeclidin-Vilant (TRELEGY ELLIPTA) 100-62.5-25 MCG/ACT AEPB, Inhale into the lungs., Disp: , Rfl:    folic acid (FOLVITE) 1 MG tablet, Take 1 mg by mouth daily. (Patient not taking: Reported on 01/01/2021), Disp: , Rfl:    gabapentin (NEURONTIN) 100 MG capsule, Take 1 capsule (100 mg total) by mouth 2 (two) times daily. (Patient not taking: Reported on 01/01/2021), Disp: 90 capsule, Rfl: 3   lisinopril-hydrochlorothiazide (ZESTORETIC) 10-12.5 MG tablet, Take 1 tablet by mouth once daily, Disp: 90 tablet, Rfl: 0   methotrexate 2.5 MG tablet, Take 8 tablets by mouth once a week. 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   Took shots for awhile but not now   Rheumatoid arthritis ([place])    Substance abuse ([place])    Tobacco use     Tobacco Use: Social History   Tobacco Use  Smoking Status Every Day   Packs/day: 0.25   Years: 51.00   Pack years: 12.75   Types: Cigarettes, Cigars  Smokeless Tobacco Never  Tobacco Comments   Wants to quit no quit date set    Labs: Recent Review Flowsheet Data     Labs for ITP Cardiac and Pulmonary Rehab Latest Ref Rng & Units 12/05/2017 07/30/2019 07/30/2019 07/31/2019 05/28/2020   Hemoglobin A1c 4.8 - 5.6 % 5.7(H) - - 5.7(H) 5.1   HCO3 20.0 - 28.0 mmol/L - 22.3 23.0 - -   ACIDBASEDEF 0.0 - 2.0 mmol/L - 7.5(H) 4.1(H) - -   O2SAT % - 59.2 67.1 - -        Pulmonary Assessment Scores:  Pulmonary Assessment Scores     Row Name 01/07/21 1004         ADL UCSD   SOB Score total 55     Rest 1     Walk 3     Stairs 5     Bath 0  Dress 3     Shop 5       CAT Score   CAT Score 22       mMRC Score   mMRC Score 3              UCSD: Self-administered rating of dyspnea associated with activities of daily living (ADLs) 6-point scale (0 = "not at all" to 5 = "maximal or unable to do because of breathlessness")  Scoring Scores range from 0 to 120.  Minimally important difference is 5 units  CAT: CAT can identify the health impairment of COPD patients and is better correlated with disease progression.  CAT has a scoring range of zero to 40. The CAT score is classified into four groups of low (less than 10), medium (10 - 20), high (21-30) and very high (31-40) based on the impact level of disease on health status. A CAT score over 10 suggests significant symptoms.  A worsening CAT score could be explained by an exacerbation, poor medication adherence, poor  inhaler technique, or progression of COPD or comorbid conditions.  CAT MCID is 2 points  mMRC: mMRC (Modified Medical Research Council) Dyspnea Scale is used to assess the degree of baseline functional disability in patients of respiratory disease due to dyspnea. No minimal important difference is established. A decrease in score of 1 point or greater is considered a positive change.   Pulmonary Function Assessment:  Pulmonary Function Assessment - 01/07/21 1005       Post Bronchodilator Spirometry Results   FVC% 89.4 %    FEV1% 76.4 %    FEV1/FVC Ratio 65    Comments 12/18/20             Exercise Target Goals: Exercise Program Goal: Individual exercise prescription set using results from initial 6 min walk test and THRR while considering  patient's activity barriers and safety.   Exercise Prescription Goal: Initial exercise prescription builds to 30-45 minutes a day of aerobic activity, 2-3 days per week.  Home exercise guidelines will be given to patient during program as part of exercise prescription that the participant will acknowledge.  Education: Aerobic Exercise: - Group verbal and visual presentation on the components of exercise prescription. Introduces F.I.T.T principle from ACSM for exercise prescriptions.  Reviews F.I.T.T. principles of aerobic exercise including progression. Written material given at graduation. Flowsheet Row Pulmonary Rehab from 01/07/2021 in [facility] Cardiac and Pulmonary Rehab  Education need identified 01/07/21       Education: Resistance Exercise: - Group verbal and visual presentation on the components of exercise prescription. Introduces F.I.T.T principle from ACSM for exercise prescriptions  Reviews F.I.T.T. principles of resistance exercise including progression. Written material given at graduation.    Education: Exercise & Equipment Safety: - Individual verbal instruction and demonstration of equipment use and safety with use of the  equipment. Flowsheet Row Pulmonary Rehab from 01/07/2021 in [facility] Cardiac and Pulmonary Rehab  Date 01/07/21  Educator [facility]  Instruction Review Code 1- Verbalizes Understanding       Education: Exercise Physiology & General Exercise Guidelines: - Group verbal and written instruction with models to review the exercise physiology of the cardiovascular system and associated critical values. Provides general exercise guidelines with specific guidelines to those with heart or lung disease.    Education: Flexibility, Balance, Mind/Body Relaxation: - Group verbal and visual presentation with interactive activity on the components of exercise prescription. Introduces F.I.T.T principle from ACSM for exercise prescriptions. Reviews F.I.T.T. principles of flexibility and balance exercise  training including progression. Also discusses the mind body connection.  Reviews various relaxation techniques to help reduce and manage stress (i.e. Deep breathing, progressive muscle relaxation, and visualization). Balance handout provided to take home. Written material given at graduation.   Activity Barriers & Risk Stratification:  Activity Barriers & Cardiac Risk Stratification - 01/01/21 1023       Activity Barriers & Cardiac Risk Stratification   Activity Barriers Arthritis;Balance Concerns;Assistive Device   cane   walker            6 Minute Walk:  Oxygen Initial Assessment:  Oxygen Initial Assessment - 01/01/21 1025       Home Oxygen   Home Oxygen Device None    Sleep Oxygen Prescription None    Home Exercise Oxygen Prescription None    Home Resting Oxygen Prescription None    Compliance with Home Oxygen Use Yes      Intervention   Short Term Goals To learn and demonstrate proper pursed lip breathing techniques or other breathing techniques. ;To learn and understand importance of maintaining oxygen saturations>88%;To learn and demonstrate proper use of respiratory medications    Long  Term  Goals Exhibits proper breathing techniques, such as pursed lip breathing or other method taught during program session;Compliance with respiratory medication;Demonstrates proper use of MDI's             Oxygen Re-Evaluation:   Oxygen Discharge (Final Oxygen Re-Evaluation):   Initial Exercise Prescription:  Initial Exercise Prescription - 01/07/21 0900       Date of Initial Exercise RX and Referring Provider   Date 01/07/21    Referring Provider Launa Flight      Oxygen   Maintain Oxygen Saturation 88% or higher      Treadmill   MPH 1.3    Grade 0    Minutes 15    METs 2      Recumbant Elliptical   Level 1    RPM 50    Minutes 15    METs 2.16      Biostep-RELP   Level 1    SPM 50    Minutes 15    METs 2.16      Track   Laps 15    Minutes 15    METs 1.82      Prescription Details   Frequency (times per week) 2    Duration Progress to 30 minutes of continuous aerobic without signs/symptoms of physical distress      Intensity   THRR 40-80% of Max Heartrate 107-135    Ratings of Perceived Exertion 11-13    Perceived Dyspnea 0-4      Resistance Training   Training Prescription Yes    Weight 3    Reps 10-15             Perform Capillary Blood Glucose checks as needed.  Exercise Prescription Changes:   Exercise Prescription Changes     Row Name 01/07/21 0900             Response to Exercise   Blood Pressure (Admit) 156/96       Blood Pressure (Exercise) 170/88       Blood Pressure (Exit) 150/88       Heart Rate (Admit) 81 bpm       Heart Rate (Exercise) 111 bpm       Heart Rate (Exit) 78 bpm       Oxygen Saturation (Admit) 96 %       Oxygen  Saturation (Exercise) 94 %       Oxygen Saturation (Exit) 97 %       Rating of Perceived Exertion (Exercise) 11       Perceived Dyspnea (Exercise) 1       Symptoms dizziness       Comments walk test results                Exercise Comments:   Exercise Goals and Review:   Exercise  Goals     Row Name 01/07/21 0957             Exercise Goals   Increase Physical Activity Yes       Intervention Provide advice, education, support and counseling about physical activity/exercise needs.;Develop an individualized exercise prescription for aerobic and resistive training based on initial evaluation findings, risk stratification, comorbidities and participant's personal goals.       Expected Outcomes Short Term: Attend rehab on a regular basis to increase amount of physical activity.;Long Term: Add in home exercise to make exercise part of routine and to increase amount of physical activity.;Long Term: Exercising regularly at least 3-5 days a week.       Increase Strength and Stamina Yes       Intervention Provide advice, education, support and counseling about physical activity/exercise needs.;Develop an individualized exercise prescription for aerobic and resistive training based on initial evaluation findings, risk stratification, comorbidities and participant's personal goals.       Expected Outcomes Short Term: Increase workloads from initial exercise prescription for resistance, speed, and METs.;Short Term: Perform resistance training exercises routinely during rehab and add in resistance training at home;Long Term: Improve cardiorespiratory fitness, muscular endurance and strength as measured by increased METs and functional capacity (6MWT)       Able to understand and use rate of perceived exertion (RPE) scale Yes       Intervention Provide education and explanation on how to use RPE scale       Expected Outcomes Long Term:  Able to use RPE to guide intensity level when exercising independently       Able to understand and use Dyspnea scale Yes       Intervention Provide education and explanation on how to use Dyspnea scale       Expected Outcomes Short Term: Able to use Dyspnea scale daily in rehab to express subjective sense of shortness of breath during exertion;Long Term:  Able to use Dyspnea scale to guide intensity level when exercising independently       Knowledge and understanding of Target Heart Rate Range (THRR) Yes       Intervention Provide education and explanation of THRR including how the numbers were predicted and where they are located for reference       Expected Outcomes Short Term: Able to state/look up THRR;Long Term: Able to use THRR to govern intensity when exercising independently;Short Term: Able to use daily as guideline for intensity in rehab       Able to check pulse independently Yes       Intervention Provide education and demonstration on how to check pulse in carotid and radial arteries.;Review the importance of being able to check your own pulse for safety during independent exercise       Expected Outcomes Short Term: Able to explain why pulse checking is important during independent exercise;Long Term: Able to check pulse independently and accurately       Understanding of Exercise Prescription Yes  Intervention Provide education, explanation, and written materials on patient's individual exercise prescription       Expected Outcomes Short Term: Able to explain program exercise prescription;Long Term: Able to explain home exercise prescription to exercise independently                Exercise Goals Re-Evaluation :   Discharge Exercise Prescription (Final Exercise Prescription Changes):  Exercise Prescription Changes - 01/07/21 0900       Response to Exercise   Blood Pressure (Admit) 156/96    Blood Pressure (Exercise) 170/88    Blood Pressure (Exit) 150/88    Heart Rate (Admit) 81 bpm    Heart Rate (Exercise) 111 bpm    Heart Rate (Exit) 78 bpm    Oxygen Saturation (Admit) 96 %    Oxygen Saturation (Exercise) 94 %    Oxygen Saturation (Exit) 97 %    Rating of Perceived Exertion (Exercise) 11    Perceived Dyspnea (Exercise) 1    Symptoms dizziness    Comments walk test results             Nutrition:   Target Goals: Understanding of nutrition guidelines, daily intake of sodium 1500mg , cholesterol 200mg , calories 30% from fat and 7% or less from saturated fats, daily to have 5 or more servings of fruits and vegetables.  Education: All About Nutrition: -Group instruction provided by verbal, written material, interactive activities, discussions, models, and posters to present general guidelines for heart healthy nutrition including fat, fiber, MyPlate, the role of sodium in heart healthy nutrition, utilization of the nutrition label, and utilization of this knowledge for meal planning. Follow up email sent as well. Written material given at graduation.   Biometrics:  Pre Biometrics - 01/07/21 0958       Pre Biometrics   Height 5' 8.5" (1.74 m)    Weight 187 lb 8 oz (85 kg)    BMI (Calculated) 28.09    Single Leg Stand 0 seconds              Nutrition Therapy Plan and Nutrition Goals:  Nutrition Therapy & Goals - 01/07/21 1001       Intervention Plan   Intervention Prescribe, educate and counsel regarding individualized specific dietary modifications aiming towards targeted core components such as weight, hypertension, lipid management, diabetes, heart failure and other comorbidities.    Expected Outcomes Short Term Goal: Understand basic principles of dietary content, such as calories, fat, sodium, cholesterol and nutrients.;Short Term Goal: A plan has been developed with personal nutrition goals set during dietitian appointment.;Long Term Goal: Adherence to prescribed nutrition plan.             Nutrition Assessments:  MEDIFICTS Score Key: ?70 Need to make dietary changes  40-70 Heart Healthy Diet ? 40 Therapeutic Level Cholesterol Diet  Flowsheet Row Pulmonary Rehab from 01/07/2021 in [facility] Cardiac and Pulmonary Rehab  Picture Your Plate Total Score on Admission 49      Picture Your Plate Scores: <25 Unhealthy dietary pattern with much room for improvement. 41-50  Dietary pattern unlikely to meet recommendations for good health and room for improvement. 51-60 More healthful dietary pattern, with some room for improvement.  >60 Healthy dietary pattern, although there may be some specific behaviors that could be improved.   Nutrition Goals Re-Evaluation:   Nutrition Goals Discharge (Final Nutrition Goals Re-Evaluation):   Psychosocial: Target Goals: Acknowledge presence or absence of significant depression and/or stress, maximize coping skills, provide positive support system. Participant is able  to verbalize types and ability to use techniques and skills needed for reducing stress and depression.   Education: Stress, Anxiety, and Depression - Group verbal and visual presentation to define topics covered.  Reviews how body is impacted by stress, anxiety, and depression.  Also discusses healthy ways to reduce stress and to treat/manage anxiety and depression.  Written material given at graduation.   Education: Sleep Hygiene -Provides group verbal and written instruction about how sleep can affect your health.  Define sleep hygiene, discuss sleep cycles and impact of sleep habits. Review good sleep hygiene tips.    Initial Review & Psychosocial Screening:  Initial Psych Review & Screening - 01/01/21 1027       Initial Review   Current issues with History of Depression;Current Stress Concerns    Source of Stress Concerns Chronic Illness;Transportation    Comments Does not drive, relies on others  Gets down at times      [place]? Yes   sister     Barriers   Psychosocial barriers to participate in program There are no identifiable barriers or psychosocial needs.;The patient should benefit from training in stress management and relaxation.      Screening Interventions   Interventions Encouraged to exercise;Provide feedback about the scores to participant;To provide support and resources with identified psychosocial  needs    Expected Outcomes Short Term goal: Utilizing psychosocial counselor, staff and physician to assist with identification of specific Stressors or current issues interfering with healing process. Setting desired goal for each stressor or current issue identified.;Long Term Goal: Stressors or current issues are controlled or eliminated.;Short Term goal: Identification and review with participant of any Quality of Life or Depression concerns found by scoring the questionnaire.;Long Term goal: The participant improves quality of Life and PHQ9 Scores as seen by post scores and/or verbalization of changes             Quality of Life Scores:  Scores of 19 and below usually indicate a poorer quality of life in these areas.  A difference of  2-3 points is a clinically meaningful difference.  A difference of 2-3 points in the total score of the Quality of Life Index has been associated with significant improvement in overall quality of life, self-image, physical symptoms, and general health in studies assessing change in quality of life.  PHQ-9: Recent Review Flowsheet Data     Depression screen [facility] 2/9 01/07/2021 11/22/2020 11/22/2020 08/25/2020 05/28/2020   Decreased Interest 1 0 0 2 0   Down, Depressed, Hopeless 1 0 0 1 0   PHQ - 2 Score 2 0 0 3 0   Altered sleeping 0 1 - 0 0   Tired, decreased energy 3 1 - 3 1   Change in appetite 0 0 - 2 0   Feeling bad or failure about yourself  0 0 - 1 0   Trouble concentrating 1 0 - 0 0   Moving slowly or fidgety/restless 1 0 - 0 0   Suicidal thoughts 0 0 - 0 0   PHQ-9 Score 7 2 - 9 1   Difficult doing work/chores Somewhat difficult Not difficult at all - Somewhat difficult Somewhat difficult      Interpretation of Total Score  Total Score Depression Severity:  1-4 = Minimal depression, 5-9 = Mild depression, 10-14 = Moderate depression, 15-19 = Moderately severe depression, 20-27 = Severe depression   Psychosocial Evaluation and Intervention:   Psychosocial Evaluation - 01/01/21  1044       Psychosocial Evaluation & Interventions   Interventions Encouraged to exercise with the program and follow exercise prescription    Comments JAme "Boodoo" is ready to join the program. He hopes to gain more walking strength and be able to walk to the store or tohis buddy's house. He does smoke cigarettes and want s to quit. No quit date yet. He only smokes 2-4 cigarettes a day. 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 We will work with him on smoking cessation. He lives with his sister. He does not drive and relies on others for transportation. He does get down at times when he finds himself at home without access to going somewhere to enjoy the day.    Expected Outcomes STG  Enmanuel attends all scheduled sessions, he improves his ability to get around so he can go visit friends and walk to store. LTG Seyed will continue to progress after discharge.    Continue Psychosocial Services  Follow up required by staff             Psychosocial Re-Evaluation:   Psychosocial Discharge (Final Psychosocial Re-Evaluation):   Education: Education Goals: Education classes will be provided on a weekly basis, covering required topics. Participant will state understanding/return demonstration of topics presented.  Learning Barriers/Preferences:   General Pulmonary Education Topics:  Infection Prevention: - Provides verbal and written material to individual with discussion of infection control including proper hand washing and proper equipment cleaning during exercise session. Flowsheet Row Pulmonary Rehab from 01/07/2021 in [facility] Cardiac and Pulmonary Rehab  Date 01/07/21  Educator [facility]  Instruction Review Code 1- Verbalizes Understanding       Falls Prevention: - Provides verbal and written material to individual with discussion of falls prevention and safety. Flowsheet Row Pulmonary Rehab from 01/07/2021 in [facility] Cardiac  and Pulmonary Rehab  Date 01/07/21  Educator [facility]  Instruction Review Code 1- Verbalizes Understanding       Chronic Lung Disease Review: - Group verbal instruction with posters, models, PowerPoint presentations and videos,  to review new updates, new respiratory medications, new advancements in procedures and treatments. Providing information on websites and "800" numbers for continued self-education. Includes information about supplement oxygen, available portable oxygen systems, continuous and intermittent flow rates, oxygen safety, concentrators, and Medicare reimbursement for oxygen. Explanation of Pulmonary Drugs, including class, frequency, complications, importance of spacers, rinsing mouth after steroid MDI's, and proper cleaning methods for nebulizers. Review of basic lung anatomy and physiology related to function, structure, and complications of lung disease. Review of risk factors. Discussion about methods for diagnosing sleep apnea and types of masks and machines for OSA. Includes a review of the use of types of environmental controls: home humidity, furnaces, filters, dust mite/pet prevention, HEPA vacuums. Discussion about weather changes, air quality and the benefits of nasal washing. Instruction on Warning signs, infection symptoms, calling MD promptly, preventive modes, and value of vaccinations. Review of effective airway clearance, coughing and/or vibration techniques. Emphasizing that all should Create an Action Plan. Written material given at graduation. Flowsheet Row Pulmonary Rehab from 01/07/2021 in [facility] Cardiac and Pulmonary Rehab  Education need identified 01/07/21       AED/CPR: - Group verbal and written instruction with the use of models to demonstrate the basic use of the AED with the basic ABC's of resuscitation.    Anatomy and Cardiac Procedures: - Group verbal and visual presentation and models provide information about  basic cardiac anatomy and function. Reviews  the testing methods done to diagnose heart disease and the outcomes of the test results. Describes the treatment choices: Medical Management, Angioplasty, or Coronary Bypass Surgery for treating various heart conditions including Myocardial Infarction, Angina, Valve Disease, and Cardiac Arrhythmias.  Written material given at graduation.   Medication Safety: - Group verbal and visual instruction to review commonly prescribed medications for heart and lung disease. Reviews the medication, class of the drug, and side effects. Includes the steps to properly store meds and maintain the prescription regimen.  Written material given at graduation.   Other: -Provides group and verbal instruction on various topics (see comments)   Knowledge Questionnaire Score:  Knowledge Questionnaire Score - 01/07/21 1001       Knowledge Questionnaire Score   Pre Score 8/18              Core Components/Risk Factors/Patient Goals at Admission:  Personal Goals and Risk Factors at Admission - 01/07/21 0959       Core Components/Risk Factors/Patient Goals on Admission    Weight Management Yes;Weight Maintenance    Intervention Weight Management: Develop a combined nutrition and exercise program designed to reach desired caloric intake, while maintaining appropriate intake of nutrient and fiber, sodium and fats, and appropriate energy expenditure required for the weight goal.;Weight Management: Provide education and appropriate resources to help participant work on and attain dietary goals.    Admit Weight 187 lb 8 oz (85 kg)    Goal Weight: Short Term 180 lb (81.6 kg)    Goal Weight: Long Term 180 lb (81.6 kg)    Expected Outcomes Short Term: Continue to assess and modify interventions until short term weight is achieved;Long Term: Adherence to nutrition and physical activity/exercise program aimed toward attainment of established weight goal;Weight Maintenance: Understanding of the daily nutrition  guidelines, which includes 25-35% calories from fat, 7% or less cal from saturated fats, less than 200mg  cholesterol, less than 1.5gm of sodium, & 5 or more servings of fruits and vegetables daily    Tobacco Cessation Yes    Number of packs per day Matyas is a current tobacco user. Intervention for tobacco cessation was provided at the initial medical review. He was asked about readiness to quit and reported he is ready to quit . Patient was advised and educated about tobacco cessation using combination therapy, tobacco cessation classes, quit line, and quit smoking apps. Patient demonstrated understanding of this material. Staff will continue to provide encouragement and follow up with the patient throughout the program.    Intervention Assist the participant in steps to quit. Provide individualized education and counseling about committing to Tobacco Cessation, relapse prevention, and pharmacological support that can be provided by physician.;Advice worker, assist with locating and accessing local/national Quit Smoking programs, and support quit date choice.    Expected Outcomes Short Term: Will demonstrate readiness to quit, by selecting a quit date.;Short Term: Will quit all tobacco product use, adhering to prevention of relapse plan.;Long Term: Complete abstinence from all tobacco products for at least 12 months from quit date.    Improve shortness of breath with ADL's Yes    Intervention Provide education, individualized exercise plan and daily activity instruction to help decrease symptoms of SOB with activities of daily living.    Expected Outcomes Short Term: Improve cardiorespiratory fitness to achieve a reduction of symptoms when performing ADLs;Long Term: Be able to perform more ADLs without symptoms or delay the onset of symptoms  Increase knowledge of respiratory medications and ability to use respiratory devices properly  Yes    Intervention Provide education and  demonstration as needed of appropriate use of medications, inhalers, and oxygen therapy.    Expected Outcomes Short Term: Achieves understanding of medications use. Understands that oxygen is a medication prescribed by physician. Demonstrates appropriate use of inhaler and oxygen therapy.;Long Term: Maintain appropriate use of medications, inhalers, and oxygen therapy.    Heart Failure Yes    Intervention Provide a combined exercise and nutrition program that is supplemented with education, support and counseling about heart failure. Directed toward relieving symptoms such as shortness of breath, decreased exercise tolerance, and extremity edema.    Expected Outcomes Improve functional capacity of life;Short term: Attendance in program 2-3 days a week with increased exercise capacity. Reported lower sodium intake. Reported increased fruit and vegetable intake. Reports medication compliance.;Short term: Daily weights obtained and reported for increase. Utilizing diuretic protocols set by physician.;Long term: Adoption of self-care skills and reduction of barriers for early signs and symptoms recognition and intervention leading to self-care maintenance.    Hypertension Yes    Intervention Provide education on lifestyle modifcations including regular physical activity/exercise, weight management, moderate sodium restriction and increased consumption of fresh fruit, vegetables, and low fat dairy, alcohol moderation, and smoking cessation.;Monitor prescription use compliance.    Expected Outcomes Short Term: Continued assessment and intervention until BP is < 140/31mm HG in hypertensive participants. < 130/40mm HG in hypertensive participants with diabetes, heart failure or chronic kidney disease.;Long Term: Maintenance of blood pressure at goal levels.             Education:Diabetes - Individual verbal and written instruction to review signs/symptoms of diabetes, desired ranges of glucose level fasting,  after meals and with exercise. Acknowledge that pre and post exercise glucose checks will be done for 3 sessions at entry of program.   Know Your Numbers and Heart Failure: - Group verbal and visual instruction to discuss disease risk factors for cardiac and pulmonary disease and treatment options.  Reviews associated critical values for Overweight/Obesity, Hypertension, Cholesterol, and Diabetes.  Discusses basics of heart failure: signs/symptoms and treatments.  Introduces Heart Failure Zone chart for action plan for heart failure.  Written material given at graduation.   Core Components/Risk Factors/Patient Goals Review:    Core Components/Risk Factors/Patient Goals at Discharge (Final Review):    ITP Comments:  ITP Comments     Row Name 01/01/21 1052 01/07/21 0951         ITP Comments Virtual orientation call completed today. he has an appointment on Date: 01/07/2021  for EP eval and gym Orientation.  Documentation of diagnosis can be found in [facility]  Date: 10/14/2022[name] is a current tobacco user. Intervention for tobacco cessation was provided at the initial medical review. He was asked about readiness to quit and reported he is ready to quit . Patient was advised and educated about tobacco cessation using combination therapy, tobacco cessation classes, quit line, and quit smoking apps. Patient demonstrated understanding of this material. Staff will continue to provide encouragement and follow up with the patient throughout the program. Completed 6MWT and gym orientation. Initial ITP created and sent for review to Dr. Zetta Bills, Medical Director.               Comments: initial ITP

## 2021-01-07 NOTE — Patient Instructions (Signed)
Patient Instructions  Patient Details  Name: Daniel Frye MRN: [id number] Date of Birth: 02-Nov-1950 Referring Provider:  Venia Carbon, MD  Below are your personal goals for exercise, nutrition, and risk factors. Our goal is to help you stay on track towards obtaining and maintaining these goals. We will be discussing your progress on these goals with you throughout the program.  Initial Exercise Prescription:  Initial Exercise Prescription - 01/07/21 0900       Date of Initial Exercise RX and Referring Provider   Date 01/07/21    Referring Provider Launa Flight      Oxygen   Maintain Oxygen Saturation 88% or higher      Treadmill   MPH 1.3    Grade 0    Minutes 15    METs 2      Recumbant Elliptical   Level 1    RPM 50    Minutes 15    METs 2.16      Biostep-RELP   Level 1    SPM 50    Minutes 15    METs 2.16      Track   Laps 15    Minutes 15    METs 1.82      Prescription Details   Frequency (times per week) 2    Duration Progress to 30 minutes of continuous aerobic without signs/symptoms of physical distress      Intensity   THRR 40-80% of Max Heartrate 107-135    Ratings of Perceived Exertion 11-13    Perceived Dyspnea 0-4      Resistance Training   Training Prescription Yes    Weight 3    Reps 10-15             Exercise Goals: Frequency: Be able to perform aerobic exercise two to three times per week in program working toward 2-5 days per week of home exercise.  Intensity: Work with a perceived exertion of 11 (fairly light) - 15 (hard) while following your exercise prescription.  We will make changes to your prescription with you as you progress through the program.   Duration: Be able to do 30 to 45 minutes of continuous aerobic exercise in addition to a 5 minute warm-up and a 5 minute cool-down routine.   Nutrition Goals: Your personal nutrition goals will be established when you do your nutrition analysis with the dietician.  The  following are general nutrition guidelines to follow: Cholesterol < 200mg /day Sodium < 1500mg /day Fiber: Men over 50 yrs - 30 grams per day  Personal Goals:  Personal Goals and Risk Factors at Admission - 01/07/21 0959       Core Components/Risk Factors/Patient Goals on Admission    Weight Management Yes;Weight Maintenance    Intervention Weight Management: Develop a combined nutrition and exercise program designed to reach desired caloric intake, while maintaining appropriate intake of nutrient and fiber, sodium and fats, and appropriate energy expenditure required for the weight goal.;Weight Management: Provide education and appropriate resources to help participant work on and attain dietary goals.    Admit Weight 187 lb 8 oz (85 kg)    Goal Weight: Short Term 180 lb (81.6 kg)    Goal Weight: Long Term 180 lb (81.6 kg)    Expected Outcomes Short Term: Continue to assess and modify interventions until short term weight is achieved;Long Term: Adherence to nutrition and physical activity/exercise program aimed toward attainment of established weight goal;Weight Maintenance: Understanding of the daily nutrition guidelines, which includes 25-35%  calories from fat, 7% or less cal from saturated fats, less than 200mg  cholesterol, less than 1.5gm of sodium, & 5 or more servings of fruits and vegetables daily    Tobacco Cessation Yes    Number of packs per day Zubin is a current tobacco user. Intervention for tobacco cessation was provided at the initial medical review. He was asked about readiness to quit and reported he is ready to quit . Patient was advised and educated about tobacco cessation using combination therapy, tobacco cessation classes, quit line, and quit smoking apps. Patient demonstrated understanding of this material. Staff will continue to provide encouragement and follow up with the patient throughout the program.    Intervention Assist the participant in steps to quit. Provide  individualized education and counseling about committing to Tobacco Cessation, relapse prevention, and pharmacological support that can be provided by physician.;Advice worker, assist with locating and accessing local/national Quit Smoking programs, and support quit date choice.    Expected Outcomes Short Term: Will demonstrate readiness to quit, by selecting a quit date.;Short Term: Will quit all tobacco product use, adhering to prevention of relapse plan.;Long Term: Complete abstinence from all tobacco products for at least 12 months from quit date.    Improve shortness of breath with ADL's Yes    Intervention Provide education, individualized exercise plan and daily activity instruction to help decrease symptoms of SOB with activities of daily living.    Expected Outcomes Short Term: Improve cardiorespiratory fitness to achieve a reduction of symptoms when performing ADLs;Long Term: Be able to perform more ADLs without symptoms or delay the onset of symptoms    Increase knowledge of respiratory medications and ability to use respiratory devices properly  Yes    Intervention Provide education and demonstration as needed of appropriate use of medications, inhalers, and oxygen therapy.    Expected Outcomes Short Term: Achieves understanding of medications use. Understands that oxygen is a medication prescribed by physician. Demonstrates appropriate use of inhaler and oxygen therapy.;Long Term: Maintain appropriate use of medications, inhalers, and oxygen therapy.    Heart Failure Yes    Intervention Provide a combined exercise and nutrition program that is supplemented with education, support and counseling about heart failure. Directed toward relieving symptoms such as shortness of breath, decreased exercise tolerance, and extremity edema.    Expected Outcomes Improve functional capacity of life;Short term: Attendance in program 2-3 days a week with increased exercise capacity. Reported  lower sodium intake. Reported increased fruit and vegetable intake. Reports medication compliance.;Short term: Daily weights obtained and reported for increase. Utilizing diuretic protocols set by physician.;Long term: Adoption of self-care skills and reduction of barriers for early signs and symptoms recognition and intervention leading to self-care maintenance.    Hypertension Yes    Intervention Provide education on lifestyle modifcations including regular physical activity/exercise, weight management, moderate sodium restriction and increased consumption of fresh fruit, vegetables, and low fat dairy, alcohol moderation, and smoking cessation.;Monitor prescription use compliance.    Expected Outcomes Short Term: Continued assessment and intervention until BP is < 140/41mm HG in hypertensive participants. < 130/74mm HG in hypertensive participants with diabetes, heart failure or chronic kidney disease.;Long Term: Maintenance of blood pressure at goal levels.             Tobacco Use Initial Evaluation: Social History   Tobacco Use  Smoking Status Every Day   Packs/day: 0.25   Years: 51.00   Pack years: 12.75   Types: Cigarettes, Cigars  Smokeless Tobacco Never  Tobacco Comments   Wants to quit no quit date set    Exercise Goals and Review:  Exercise Goals     Row Name 01/07/21 0957             Exercise Goals   Increase Physical Activity Yes       Intervention Provide advice, education, support and counseling about physical activity/exercise needs.;Develop an individualized exercise prescription for aerobic and resistive training based on initial evaluation findings, risk stratification, comorbidities and participant's personal goals.       Expected Outcomes Short Term: Attend rehab on a regular basis to increase amount of physical activity.;Long Term: Add in home exercise to make exercise part of routine and to increase amount of physical activity.;Long Term: Exercising  regularly at least 3-5 days a week.       Increase Strength and Stamina Yes       Intervention Provide advice, education, support and counseling about physical activity/exercise needs.;Develop an individualized exercise prescription for aerobic and resistive training based on initial evaluation findings, risk stratification, comorbidities and participant's personal goals.       Expected Outcomes Short Term: Increase workloads from initial exercise prescription for resistance, speed, and METs.;Short Term: Perform resistance training exercises routinely during rehab and add in resistance training at home;Long Term: Improve cardiorespiratory fitness, muscular endurance and strength as measured by increased METs and functional capacity (6MWT)       Able to understand and use rate of perceived exertion (RPE) scale Yes       Intervention Provide education and explanation on how to use RPE scale       Expected Outcomes Long Term:  Able to use RPE to guide intensity level when exercising independently       Able to understand and use Dyspnea scale Yes       Intervention Provide education and explanation on how to use Dyspnea scale       Expected Outcomes Short Term: Able to use Dyspnea scale daily in rehab to express subjective sense of shortness of breath during exertion;Long Term: Able to use Dyspnea scale to guide intensity level when exercising independently       Knowledge and understanding of Target Heart Rate Range (THRR) Yes       Intervention Provide education and explanation of THRR including how the numbers were predicted and where they are located for reference       Expected Outcomes Short Term: Able to state/look up THRR;Long Term: Able to use THRR to govern intensity when exercising independently;Short Term: Able to use daily as guideline for intensity in rehab       Able to check pulse independently Yes       Intervention Provide education and demonstration on how to check pulse in carotid and  radial arteries.;Review the importance of being able to check your own pulse for safety during independent exercise       Expected Outcomes Short Term: Able to explain why pulse checking is important during independent exercise;Long Term: Able to check pulse independently and accurately       Understanding of Exercise Prescription Yes       Intervention Provide education, explanation, and written materials on patient's individual exercise prescription       Expected Outcomes Short Term: Able to explain program exercise prescription;Long Term: Able to explain home exercise prescription to exercise independently                Copy of goals given to  participant.

## 2021-01-19 ENCOUNTER — Ambulatory Visit: Payer: Medicare Other

## 2021-01-19 ENCOUNTER — Telehealth: Payer: Self-pay

## 2021-01-19 NOTE — Telephone Encounter (Signed)
Spoke with patient, he missed his first day with Pulmonary Rehab. Very hard to hear on phone with background noise and static. Had patient clarify and repeat multiple times. Per patient, he did not have transportation and could not find phone number to call. Verified schedule with patient and confirmed he will be coming this Thursday, 9/17 at 9:45 AM.

## 2021-01-21 ENCOUNTER — Ambulatory Visit: Payer: Medicare Other

## 2021-01-26 ENCOUNTER — Ambulatory Visit: Payer: Medicare Other

## 2021-01-27 DIAGNOSIS — J449 Chronic obstructive pulmonary disease, unspecified: Secondary | ICD-10-CM

## 2021-01-27 NOTE — Telephone Encounter (Signed)
Spoke with patient again- transportation is a barrier and I provided [place] phone number. Patient will call us back and keep [place] updated on his ride situation.

## 2021-02-02 ENCOUNTER — Ambulatory Visit: Payer: Medicare Other

## 2021-02-03 ENCOUNTER — Encounter: Payer: Self-pay | Admitting: *Deleted

## 2021-02-03 DIAGNOSIS — J449 Chronic obstructive pulmonary disease, unspecified: Secondary | ICD-10-CM

## 2021-02-03 NOTE — Progress Notes (Signed)
Pulmonary Individual Treatment Plan  Patient Details  Name: Oniel Meleski MRN: [id number] Date of Birth: 03/31/1950 Referring Provider:   Flowsheet Row Pulmonary Rehab from 01/07/2021 in [facility] Cardiac and Pulmonary Rehab  Referring Provider Launa Flight       Initial Encounter Date:  Flowsheet Row Pulmonary Rehab from 01/07/2021 in [facility] Cardiac and Pulmonary Rehab  Date 01/07/21       Visit Diagnosis: Chronic obstructive pulmonary disease, unspecified COPD type ([place])  Patient's Home Medications on Admission:  Current Outpatient Medications:    Adalimumab (HUMIRA PEN) 40 MG/0.4ML PNKT, Inject into the skin., Disp: , Rfl:    albuterol (PROAIR HFA) 108 (90 Base) MCG/ACT inhaler, INHALE 2 PUFFS BY MOUTH EVERY 4 HOURS AS NEEDED FOR  WHEEZING  OR  SHORTNESS  OF  BREATH, Disp: 27 g, Rfl: 0   albuterol (PROVENTIL) (2.5 MG/3ML) 0.083% nebulizer solution, Take 3 mLs (2.5 mg total) by nebulization every 6 (six) hours as needed for wheezing or shortness of breath., Disp: 75 mL, Rfl: 0   folic acid (FOLVITE) 1 MG tablet, Take 1 mg by mouth daily. (Patient not taking: Reported on 01/01/2021), Disp: , Rfl:    gabapentin (NEURONTIN) 100 MG capsule, Take 1 capsule (100 mg total) by mouth 2 (two) times daily. (Patient not taking: Reported on 01/01/2021), Disp: 90 capsule, Rfl: 3   lisinopril-hydrochlorothiazide (ZESTORETIC) 10-12.5 MG tablet, Take 1 tablet by mouth once daily, Disp: 90 tablet, Rfl: 0   methotrexate 2.5 MG tablet, Take 8 tablets by mouth once a week. 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   Took shots for awhile but not now   Rheumatoid arthritis ([place])    Substance abuse ([place])    Tobacco use     Tobacco Use: Social History   Tobacco Use  Smoking Status Every Day   Packs/day: 0.25   Years: 51.00   Pack years: 12.75   Types: Cigarettes, Cigars  Smokeless Tobacco Never  Tobacco Comments   Wants to quit no quit date set    Labs: Recent Review Flowsheet Data     Labs for ITP Cardiac and Pulmonary Rehab Latest Ref Rng & Units 12/05/2017 07/30/2019 07/30/2019 07/31/2019 05/28/2020   Hemoglobin A1c 4.8 - 5.6 % 5.7(H) - - 5.7(H) 5.1   HCO3 20.0 - 28.0 mmol/L - 22.3 23.0 - -   ACIDBASEDEF 0.0 - 2.0 mmol/L - 7.5(H) 4.1(H) - -   O2SAT % - 59.2 67.1 - -        Pulmonary Assessment Scores:  Pulmonary Assessment Scores     Row Name 01/07/21 1004         ADL UCSD   SOB Score total 55     Rest 1     Walk 3     Stairs 5     Bath 0     Dress 3     Shop 5       CAT  Score   CAT Score 22       mMRC Score   mMRC Score 3              UCSD: Self-administered rating of dyspnea associated with activities of daily living (ADLs) 6-point scale (0 = "not at all" to 5 = "maximal or unable to do because of breathlessness")  Scoring Scores range from 0 to 120.  Minimally important difference is 5 units  CAT: CAT can identify the health impairment of COPD patients and is better correlated with disease progression.  CAT has a scoring range of zero to 40. The CAT score is classified into four groups of low (less than 10), medium (10 - 20), high (21-30) and very high (31-40) based on the impact level of disease on health status. A CAT score over 10 suggests significant symptoms.  A worsening CAT score could be explained by an exacerbation, poor medication adherence, poor inhaler technique, or progression of COPD or comorbid conditions.  CAT MCID is 2 points  mMRC: mMRC  (Modified Medical Research Council) Dyspnea Scale is used to assess the degree of baseline functional disability in patients of respiratory disease due to dyspnea. No minimal important difference is established. A decrease in score of 1 point or greater is considered a positive change.   Pulmonary Function Assessment:  Pulmonary Function Assessment - 01/07/21 1005       Post Bronchodilator Spirometry Results   FVC% 89.4 %    FEV1% 76.4 %    FEV1/FVC Ratio 65    Comments 12/18/20             Exercise Target Goals: Exercise Program Goal: Individual exercise prescription set using results from initial 6 min walk test and THRR while considering  patient's activity barriers and safety.   Exercise Prescription Goal: Initial exercise prescription builds to 30-45 minutes a day of aerobic activity, 2-3 days per week.  Home exercise guidelines will be given to patient during program as part of exercise prescription that the participant will acknowledge.  Education: Aerobic Exercise: - Group verbal and visual presentation on the components of exercise prescription. Introduces F.I.T.T principle from ACSM for exercise prescriptions.  Reviews F.I.T.T. principles of aerobic exercise including progression. Written material given at graduation. Flowsheet Row Pulmonary Rehab from 01/07/2021 in [facility] Cardiac and Pulmonary Rehab  Education need identified 01/07/21       Education: Resistance Exercise: - Group verbal and visual presentation on the components of exercise prescription. Introduces F.I.T.T principle from ACSM for exercise prescriptions  Reviews F.I.T.T. principles of resistance exercise including progression. Written material given at graduation.    Education: Exercise & Equipment Safety: - Individual verbal instruction and demonstration of equipment use and safety with use of the equipment. Flowsheet Row Pulmonary Rehab from 01/07/2021 in [facility] Cardiac and Pulmonary Rehab  Date 01/07/21   Educator [facility]  Instruction Review Code 1- Verbalizes Understanding       Education: Exercise Physiology & General Exercise Guidelines: - Group verbal and written instruction with models to review the exercise physiology of the cardiovascular system and associated critical values. Provides general exercise guidelines with specific guidelines to those with heart or lung disease.    Education: Flexibility, Balance, Mind/Body Relaxation: - Group verbal and visual presentation with interactive activity on the components of exercise prescription. Introduces F.I.T.T principle from ACSM for exercise prescriptions. Reviews F.I.T.T. principles of flexibility and balance exercise training including progression. Also discusses the mind body connection.  Reviews various relaxation techniques to  help reduce and manage stress (i.e. Deep breathing, progressive muscle relaxation, and visualization). Balance handout provided to take home. Written material given at graduation.   Activity Barriers & Risk Stratification:  Activity Barriers & Cardiac Risk Stratification - 01/01/21 1023       Activity Barriers & Cardiac Risk Stratification   Activity Barriers Arthritis;Balance Concerns;Assistive Device   cane   walker            6 Minute Walk:  Oxygen Initial Assessment:  Oxygen Initial Assessment - 01/01/21 1025       Home Oxygen   Home Oxygen Device None    Sleep Oxygen Prescription None    Home Exercise Oxygen Prescription None    Home Resting Oxygen Prescription None    Compliance with Home Oxygen Use Yes      Intervention   Short Term Goals To learn and demonstrate proper pursed lip breathing techniques or other breathing techniques. ;To learn and understand importance of maintaining oxygen saturations>88%;To learn and demonstrate proper use of respiratory medications    Long  Term Goals Exhibits proper breathing techniques, such as pursed lip breathing or other method taught during program  session;Compliance with respiratory medication;Demonstrates proper use of MDI's             Oxygen Re-Evaluation:   Oxygen Discharge (Final Oxygen Re-Evaluation):   Initial Exercise Prescription:  Initial Exercise Prescription - 01/07/21 0900       Date of Initial Exercise RX and Referring Provider   Date 01/07/21    Referring Provider Launa Flight      Oxygen   Maintain Oxygen Saturation 88% or higher      Treadmill   MPH 1.3    Grade 0    Minutes 15    METs 2      Recumbant Elliptical   Level 1    RPM 50    Minutes 15    METs 2.16      Biostep-RELP   Level 1    SPM 50    Minutes 15    METs 2.16      Track   Laps 15    Minutes 15    METs 1.82      Prescription Details   Frequency (times per week) 2    Duration Progress to 30 minutes of continuous aerobic without signs/symptoms of physical distress      Intensity   THRR 40-80% of Max Heartrate 107-135    Ratings of Perceived Exertion 11-13    Perceived Dyspnea 0-4      Resistance Training   Training Prescription Yes    Weight 3    Reps 10-15             Perform Capillary Blood Glucose checks as needed.  Exercise Prescription Changes:   Exercise Prescription Changes     Row Name 01/07/21 0900             Response to Exercise   Blood Pressure (Admit) 156/96       Blood Pressure (Exercise) 170/88       Blood Pressure (Exit) 150/88       Heart Rate (Admit) 81 bpm       Heart Rate (Exercise) 111 bpm       Heart Rate (Exit) 78 bpm       Oxygen Saturation (Admit) 96 %       Oxygen Saturation (Exercise) 94 %       Oxygen Saturation (Exit) 97 %  Rating of Perceived Exertion (Exercise) 11       Perceived Dyspnea (Exercise) 1       Symptoms dizziness       Comments walk test results                Exercise Comments:   Exercise Goals and Review:   Exercise Goals     Row Name 01/07/21 0957             Exercise Goals   Increase Physical Activity Yes        Intervention Provide advice, education, support and counseling about physical activity/exercise needs.;Develop an individualized exercise prescription for aerobic and resistive training based on initial evaluation findings, risk stratification, comorbidities and participant's personal goals.       Expected Outcomes Short Term: Attend rehab on a regular basis to increase amount of physical activity.;Long Term: Add in home exercise to make exercise part of routine and to increase amount of physical activity.;Long Term: Exercising regularly at least 3-5 days a week.       Increase Strength and Stamina Yes       Intervention Provide advice, education, support and counseling about physical activity/exercise needs.;Develop an individualized exercise prescription for aerobic and resistive training based on initial evaluation findings, risk stratification, comorbidities and participant's personal goals.       Expected Outcomes Short Term: Increase workloads from initial exercise prescription for resistance, speed, and METs.;Short Term: Perform resistance training exercises routinely during rehab and add in resistance training at home;Long Term: Improve cardiorespiratory fitness, muscular endurance and strength as measured by increased METs and functional capacity (6MWT)       Able to understand and use rate of perceived exertion (RPE) scale Yes       Intervention Provide education and explanation on how to use RPE scale       Expected Outcomes Long Term:  Able to use RPE to guide intensity level when exercising independently       Able to understand and use Dyspnea scale Yes       Intervention Provide education and explanation on how to use Dyspnea scale       Expected Outcomes Short Term: Able to use Dyspnea scale daily in rehab to express subjective sense of shortness of breath during exertion;Long Term: Able to use Dyspnea scale to guide intensity level when exercising independently       Knowledge and  understanding of Target Heart Rate Range (THRR) Yes       Intervention Provide education and explanation of THRR including how the numbers were predicted and where they are located for reference       Expected Outcomes Short Term: Able to state/look up THRR;Long Term: Able to use THRR to govern intensity when exercising independently;Short Term: Able to use daily as guideline for intensity in rehab       Able to check pulse independently Yes       Intervention Provide education and demonstration on how to check pulse in carotid and radial arteries.;Review the importance of being able to check your own pulse for safety during independent exercise       Expected Outcomes Short Term: Able to explain why pulse checking is important during independent exercise;Long Term: Able to check pulse independently and accurately       Understanding of Exercise Prescription Yes       Intervention Provide education, explanation, and written materials on patient's individual exercise prescription  Expected Outcomes Short Term: Able to explain program exercise prescription;Long Term: Able to explain home exercise prescription to exercise independently                Exercise Goals Re-Evaluation :   Discharge Exercise Prescription (Final Exercise Prescription Changes):  Exercise Prescription Changes - 01/07/21 0900       Response to Exercise   Blood Pressure (Admit) 156/96    Blood Pressure (Exercise) 170/88    Blood Pressure (Exit) 150/88    Heart Rate (Admit) 81 bpm    Heart Rate (Exercise) 111 bpm    Heart Rate (Exit) 78 bpm    Oxygen Saturation (Admit) 96 %    Oxygen Saturation (Exercise) 94 %    Oxygen Saturation (Exit) 97 %    Rating of Perceived Exertion (Exercise) 11    Perceived Dyspnea (Exercise) 1    Symptoms dizziness    Comments walk test results             Nutrition:  Target Goals: Understanding of nutrition guidelines, daily intake of sodium 1500mg , cholesterol 200mg ,  calories 30% from fat and 7% or less from saturated fats, daily to have 5 or more servings of fruits and vegetables.  Education: All About Nutrition: -Group instruction provided by verbal, written material, interactive activities, discussions, models, and posters to present general guidelines for heart healthy nutrition including fat, fiber, MyPlate, the role of sodium in heart healthy nutrition, utilization of the nutrition label, and utilization of this knowledge for meal planning. Follow up email sent as well. Written material given at graduation.   Biometrics:  Pre Biometrics - 01/07/21 0958       Pre Biometrics   Height 5' 8.5" (1.74 m)    Weight 187 lb 8 oz (85 kg)    BMI (Calculated) 28.09    Single Leg Stand 0 seconds              Nutrition Therapy Plan and Nutrition Goals:  Nutrition Therapy & Goals - 01/07/21 1001       Intervention Plan   Intervention Prescribe, educate and counsel regarding individualized specific dietary modifications aiming towards targeted core components such as weight, hypertension, lipid management, diabetes, heart failure and other comorbidities.    Expected Outcomes Short Term Goal: Understand basic principles of dietary content, such as calories, fat, sodium, cholesterol and nutrients.;Short Term Goal: A plan has been developed with personal nutrition goals set during dietitian appointment.;Long Term Goal: Adherence to prescribed nutrition plan.             Nutrition Assessments:  MEDIFICTS Score Key: ?70 Need to make dietary changes  40-70 Heart Healthy Diet ? 40 Therapeutic Level Cholesterol Diet  Flowsheet Row Pulmonary Rehab from 01/07/2021 in [facility] Cardiac and Pulmonary Rehab  Picture Your Plate Total Score on Admission 49      Picture Your Plate Scores: <78 Unhealthy dietary pattern with much room for improvement. 41-50 Dietary pattern unlikely to meet recommendations for good health and room for improvement. 51-60 More  healthful dietary pattern, with some room for improvement.  >60 Healthy dietary pattern, although there may be some specific behaviors that could be improved.   Nutrition Goals Re-Evaluation:   Nutrition Goals Discharge (Final Nutrition Goals Re-Evaluation):   Psychosocial: Target Goals: Acknowledge presence or absence of significant depression and/or stress, maximize coping skills, provide positive support system. Participant is able to verbalize types and ability to use techniques and skills needed for reducing stress and depression.  Education: Stress, Anxiety, and Depression - Group verbal and visual presentation to define topics covered.  Reviews how body is impacted by stress, anxiety, and depression.  Also discusses healthy ways to reduce stress and to treat/manage anxiety and depression.  Written material given at graduation.   Education: Sleep Hygiene -Provides group verbal and written instruction about how sleep can affect your health.  Define sleep hygiene, discuss sleep cycles and impact of sleep habits. Review good sleep hygiene tips.    Initial Review & Psychosocial Screening:  Initial Psych Review & Screening - 01/01/21 1027       Initial Review   Current issues with History of Depression;Current Stress Concerns    Source of Stress Concerns Chronic Illness;Transportation    Comments Does not drive, relies on others  Gets down at times      [place]? Yes   sister     Barriers   Psychosocial barriers to participate in program There are no identifiable barriers or psychosocial needs.;The patient should benefit from training in stress management and relaxation.      Screening Interventions   Interventions Encouraged to exercise;Provide feedback about the scores to participant;To provide support and resources with identified psychosocial needs    Expected Outcomes Short Term goal: Utilizing psychosocial counselor, staff and physician to  assist with identification of specific Stressors or current issues interfering with healing process. Setting desired goal for each stressor or current issue identified.;Long Term Goal: Stressors or current issues are controlled or eliminated.;Short Term goal: Identification and review with participant of any Quality of Life or Depression concerns found by scoring the questionnaire.;Long Term goal: The participant improves quality of Life and PHQ9 Scores as seen by post scores and/or verbalization of changes             Quality of Life Scores:  Scores of 19 and below usually indicate a poorer quality of life in these areas.  A difference of  2-3 points is a clinically meaningful difference.  A difference of 2-3 points in the total score of the Quality of Life Index has been associated with significant improvement in overall quality of life, self-image, physical symptoms, and general health in studies assessing change in quality of life.  PHQ-9: Recent Review Flowsheet Data     Depression screen [facility] 2/9 01/07/2021 11/22/2020 11/22/2020 08/25/2020 05/28/2020   Decreased Interest 1 0 0 2 0   Down, Depressed, Hopeless 1 0 0 1 0   PHQ - 2 Score 2 0 0 3 0   Altered sleeping 0 1 - 0 0   Tired, decreased energy 3 1 - 3 1   Change in appetite 0 0 - 2 0   Feeling bad or failure about yourself  0 0 - 1 0   Trouble concentrating 1 0 - 0 0   Moving slowly or fidgety/restless 1 0 - 0 0   Suicidal thoughts 0 0 - 0 0   PHQ-9 Score 7 2 - 9 1   Difficult doing work/chores Somewhat difficult Not difficult at all - Somewhat difficult Somewhat difficult      Interpretation of Total Score  Total Score Depression Severity:  1-4 = Minimal depression, 5-9 = Mild depression, 10-14 = Moderate depression, 15-19 = Moderately severe depression, 20-27 = Severe depression   Psychosocial Evaluation and Intervention:  Psychosocial Evaluation - 01/01/21 1044       Psychosocial Evaluation & Interventions    Interventions Encouraged to exercise with  the program and follow exercise prescription    Comments JAme "Boodoo" is ready to join the program. He hopes to gain more walking strength and be able to walk to the store or tohis buddy's house. He does smoke cigarettes and want s to quit. No quit date yet. He only smokes 2-4 cigarettes a day. 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 We will work with him on smoking cessation. He lives with his sister. He does not drive and relies on others for transportation. He does get down at times when he finds himself at home without access to going somewhere to enjoy the day.    Expected Outcomes STG  Cinch attends all scheduled sessions, he improves his ability to get around so he can go visit friends and walk to store. LTG [place] will continue to progress after discharge.    Continue Psychosocial Services  Follow up required by staff             Psychosocial Re-Evaluation:   Psychosocial Discharge (Final Psychosocial Re-Evaluation):   Education: Education Goals: Education classes will be provided on a weekly basis, covering required topics. Participant will state understanding/return demonstration of topics presented.  Learning Barriers/Preferences:   General Pulmonary Education Topics:  Infection Prevention: - Provides verbal and written material to individual with discussion of infection control including proper hand washing and proper equipment cleaning during exercise session. Flowsheet Row Pulmonary Rehab from 01/07/2021 in [facility] Cardiac and Pulmonary Rehab  Date 01/07/21  Educator [facility]  Instruction Review Code 1- Verbalizes Understanding       Falls Prevention: - Provides verbal and written material to individual with discussion of falls prevention and safety. Flowsheet Row Pulmonary Rehab from 01/07/2021 in [facility] Cardiac and Pulmonary Rehab  Date 01/07/21  Educator [facility]  Instruction Review Code 1- Verbalizes  Understanding       Chronic Lung Disease Review: - Group verbal instruction with posters, models, PowerPoint presentations and videos,  to review new updates, new respiratory medications, new advancements in procedures and treatments. Providing information on websites and "800" numbers for continued self-education. Includes information about supplement oxygen, available portable oxygen systems, continuous and intermittent flow rates, oxygen safety, concentrators, and Medicare reimbursement for oxygen. Explanation of Pulmonary Drugs, including class, frequency, complications, importance of spacers, rinsing mouth after steroid MDI's, and proper cleaning methods for nebulizers. Review of basic lung anatomy and physiology related to function, structure, and complications of lung disease. Review of risk factors. Discussion about methods for diagnosing sleep apnea and types of masks and machines for OSA. Includes a review of the use of types of environmental controls: home humidity, furnaces, filters, dust mite/pet prevention, HEPA vacuums. Discussion about weather changes, air quality and the benefits of nasal washing. Instruction on Warning signs, infection symptoms, calling MD promptly, preventive modes, and value of vaccinations. Review of effective airway clearance, coughing and/or vibration techniques. Emphasizing that all should Create an Action Plan. Written material given at graduation. Flowsheet Row Pulmonary Rehab from 01/07/2021 in [facility] Cardiac and Pulmonary Rehab  Education need identified 01/07/21       AED/CPR: - Group verbal and written instruction with the use of models to demonstrate the basic use of the AED with the basic ABC's of resuscitation.    Anatomy and Cardiac Procedures: - Group verbal and visual presentation and models provide information about basic cardiac anatomy and function. Reviews the testing methods done to diagnose heart disease and the outcomes of  the test results.  Describes the treatment choices: Medical Management, Angioplasty, or Coronary Bypass Surgery for treating various heart conditions including Myocardial Infarction, Angina, Valve Disease, and Cardiac Arrhythmias.  Written material given at graduation.   Medication Safety: - Group verbal and visual instruction to review commonly prescribed medications for heart and lung disease. Reviews the medication, class of the drug, and side effects. Includes the steps to properly store meds and maintain the prescription regimen.  Written material given at graduation.   Other: -Provides group and verbal instruction on various topics (see comments)   Knowledge Questionnaire Score:  Knowledge Questionnaire Score - 01/07/21 1001       Knowledge Questionnaire Score   Pre Score 8/18              Core Components/Risk Factors/Patient Goals at Admission:  Personal Goals and Risk Factors at Admission - 01/07/21 0959       Core Components/Risk Factors/Patient Goals on Admission    Weight Management Yes;Weight Maintenance    Intervention Weight Management: Develop a combined nutrition and exercise program designed to reach desired caloric intake, while maintaining appropriate intake of nutrient and fiber, sodium and fats, and appropriate energy expenditure required for the weight goal.;Weight Management: Provide education and appropriate resources to help participant work on and attain dietary goals.    Admit Weight 187 lb 8 oz (85 kg)    Goal Weight: Short Term 180 lb (81.6 kg)    Goal Weight: Long Term 180 lb (81.6 kg)    Expected Outcomes Short Term: Continue to assess and modify interventions until short term weight is achieved;Long Term: Adherence to nutrition and physical activity/exercise program aimed toward attainment of established weight goal;Weight Maintenance: Understanding of the daily nutrition guidelines, which includes 25-35% calories from fat, 7% or less cal from saturated fats, less than  200mg  cholesterol, less than 1.5gm of sodium, & 5 or more servings of fruits and vegetables daily    Tobacco Cessation Yes    Number of packs per day Savino is a current tobacco user. Intervention for tobacco cessation was provided at the initial medical review. He was asked about readiness to quit and reported he is ready to quit . Patient was advised and educated about tobacco cessation using combination therapy, tobacco cessation classes, quit line, and quit smoking apps. Patient demonstrated understanding of this material. Staff will continue to provide encouragement and follow up with the patient throughout the program.    Intervention Assist the participant in steps to quit. Provide individualized education and counseling about committing to Tobacco Cessation, relapse prevention, and pharmacological support that can be provided by physician.;Advice worker, assist with locating and accessing local/national Quit Smoking programs, and support quit date choice.    Expected Outcomes Short Term: Will demonstrate readiness to quit, by selecting a quit date.;Short Term: Will quit all tobacco product use, adhering to prevention of relapse plan.;Long Term: Complete abstinence from all tobacco products for at least 12 months from quit date.    Improve shortness of breath with ADL's Yes    Intervention Provide education, individualized exercise plan and daily activity instruction to help decrease symptoms of SOB with activities of daily living.    Expected Outcomes Short Term: Improve cardiorespiratory fitness to achieve a reduction of symptoms when performing ADLs;Long Term: Be able to perform more ADLs without symptoms or delay the onset of symptoms    Increase knowledge of respiratory medications and ability to use respiratory devices properly  Yes  Intervention Provide education and demonstration as needed of appropriate use of medications, inhalers, and oxygen therapy.    Expected Outcomes  Short Term: Achieves understanding of medications use. Understands that oxygen is a medication prescribed by physician. Demonstrates appropriate use of inhaler and oxygen therapy.;Long Term: Maintain appropriate use of medications, inhalers, and oxygen therapy.    Heart Failure Yes    Intervention Provide a combined exercise and nutrition program that is supplemented with education, support and counseling about heart failure. Directed toward relieving symptoms such as shortness of breath, decreased exercise tolerance, and extremity edema.    Expected Outcomes Improve functional capacity of life;Short term: Attendance in program 2-3 days a week with increased exercise capacity. Reported lower sodium intake. Reported increased fruit and vegetable intake. Reports medication compliance.;Short term: Daily weights obtained and reported for increase. Utilizing diuretic protocols set by physician.;Long term: Adoption of self-care skills and reduction of barriers for early signs and symptoms recognition and intervention leading to self-care maintenance.    Hypertension Yes    Intervention Provide education on lifestyle modifcations including regular physical activity/exercise, weight management, moderate sodium restriction and increased consumption of fresh fruit, vegetables, and low fat dairy, alcohol moderation, and smoking cessation.;Monitor prescription use compliance.    Expected Outcomes Short Term: Continued assessment and intervention until BP is < 140/81mm HG in hypertensive participants. < 130/19mm HG in hypertensive participants with diabetes, heart failure or chronic kidney disease.;Long Term: Maintenance of blood pressure at goal levels.             Education:Diabetes - Individual verbal and written instruction to review signs/symptoms of diabetes, desired ranges of glucose level fasting, after meals and with exercise. Acknowledge that pre and post exercise glucose checks will be done for 3  sessions at entry of program.   Know Your Numbers and Heart Failure: - Group verbal and visual instruction to discuss disease risk factors for cardiac and pulmonary disease and treatment options.  Reviews associated critical values for Overweight/Obesity, Hypertension, Cholesterol, and Diabetes.  Discusses basics of heart failure: signs/symptoms and treatments.  Introduces Heart Failure Zone chart for action plan for heart failure.  Written material given at graduation.   Core Components/Risk Factors/Patient Goals Review:    Core Components/Risk Factors/Patient Goals at Discharge (Final Review):    ITP Comments:  ITP Comments     Row Name 01/01/21 1052 01/07/21 0951 01/27/21 1147 02/03/21 0617     ITP Comments Virtual orientation call completed today. he has an appointment on Date: 01/07/2021  for EP eval and gym Orientation.  Documentation of diagnosis can be found in [facility]  Date: 10/14/2022[name] is a current tobacco user. Intervention for tobacco cessation was provided at the initial medical review. He was asked about readiness to quit and reported he is ready to quit . Patient was advised and educated about tobacco cessation using combination therapy, tobacco cessation classes, quit line, and quit smoking apps. Patient demonstrated understanding of this material. Staff will continue to provide encouragement and follow up with the patient throughout the program. Completed 6MWT and gym orientation. Initial ITP created and sent for review to Dr. Zetta Bills, Medical Director. Patient has not attended his 1st day yet due to transportation. He is looking to connect with services to help. 30 Day review completed. Medical Director ITP review done, changes made as directed, and signed approval by Medical Director.   New to program             Comments:

## 2021-02-04 ENCOUNTER — Ambulatory Visit: Payer: Medicare Other

## 2021-02-09 ENCOUNTER — Ambulatory Visit: Payer: Medicare Other | Admitting: *Deleted

## 2021-02-11 ENCOUNTER — Ambulatory Visit: Payer: Medicare Other

## 2021-02-11 DIAGNOSIS — J449 Chronic obstructive pulmonary disease, unspecified: Secondary | ICD-10-CM

## 2021-02-11 NOTE — Progress Notes (Signed)
Discharge Progress Report  Patient Details  Name: Daniel Frye MRN: [id number] Date of Birth: [birth year] Referring Provider:   Flowsheet Row Pulmonary Rehab from 01/07/2021 in [facility]  Referring Provider Launa Flight        Number of Visits: 1  Reason for Discharge:  Early Exit:  Personal and Lack of attendance  Smoking History:  Social History   Tobacco Use  Smoking Status Every Day   Packs/day: 0.25   Years: 51.00   Pack years: 12.75   Types: Cigarettes, Cigars  Smokeless Tobacco Never  Tobacco Comments   Wants to quit no quit date set    Diagnosis:  Chronic obstructive pulmonary disease, unspecified COPD type ([place])  ADL [facility]:  Pulmonary Assessment Scores     Row Name 01/07/21 1004         ADL [facility]   SOB Score total 55     Rest 1     Walk 3     Stairs 5     Bath 0     Dress 3     Shop 5       CAT Score   CAT Score 22       mMRC Score   mMRC Score 3              Initial Exercise Prescription:  Initial Exercise Prescription - 01/07/21 0900       Date of Initial Exercise RX and Referring Provider   Date 01/07/21    Referring Provider Launa Flight      Oxygen   Maintain Oxygen Saturation 88% or higher      Treadmill   MPH 1.3    Grade 0    Minutes 15    METs 2      Recumbant Elliptical   Level 1    RPM 50    Minutes 15    METs 2.16      Biostep-RELP   Level 1    SPM 50    Minutes 15    METs 2.16      Track   Laps 15    Minutes 15    METs 1.82      Prescription Details   Frequency (times per week) 2    Duration Progress to 30 minutes of continuous aerobic without signs/symptoms of physical distress      Intensity   THRR 40-80% of Max Heartrate 107-135    Ratings of Perceived Exertion 11-13    Perceived Dyspnea 0-4      Resistance Training   Training Prescription Yes    Weight 3    Reps 10-15             Discharge Exercise Prescription (Final Exercise Prescription Changes):   Exercise Prescription Changes - 01/07/21 0900       Response to Exercise   Blood Pressure (Admit) 156/96    Blood Pressure (Exercise) 170/88    Blood Pressure (Exit) 150/88    Heart Rate (Admit) 81 bpm    Heart Rate (Exercise) 111 bpm    Heart Rate (Exit) 78 bpm    Oxygen Saturation (Admit) 96 %    Oxygen Saturation (Exercise) 94 %    Oxygen Saturation (Exit) 97 %    Rating of Perceived Exertion (Exercise) 11    Perceived Dyspnea (Exercise) 1    Symptoms dizziness    Comments walk test results  Nutrition & Weight - Outcomes:  Pre Biometrics - 01/07/21 0958       Pre Biometrics   Height 5' 8.5" (1.74 m)    Weight 187 lb 8 oz (85 kg)    BMI (Calculated) 28.09    Single Leg Stand 0 seconds              Nutrition:  Nutrition Therapy & Goals - 01/07/21 1001       Intervention Plan   Intervention Prescribe, educate and counsel regarding individualized specific dietary modifications aiming towards targeted core components such as weight, hypertension, lipid management, diabetes, heart failure and other comorbidities.    Expected Outcomes Short Term Goal: Understand basic principles of dietary content, such as calories, fat, sodium, cholesterol and nutrients.;Short Term Goal: A plan has been developed with personal nutrition goals set during dietitian appointment.;Long Term Goal: Adherence to prescribed nutrition plan.              Goals reviewed with patient; copy given to patient.

## 2021-02-11 NOTE — Telephone Encounter (Signed)
Spoke with patient again, he is still unable to get transportation at this time. Still in contact with [facility] and/or may plan to use a friend to help. Reiterated to patient that it is important to make sure his transportation is reliable so he is able to comply with the program. Will go ahead and discharge at this time. Has not attended since 11/3. Advised patient once he has transportation correctly set up, to ask his provider to send a new referral and we can get him back in the program. Our call back number was provided. Patient agreed and understood.

## 2021-02-11 NOTE — Progress Notes (Signed)
Pulmonary Individual Treatment Plan  Patient Details  Name: Daniel Frye MRN: [id number] Date of Birth: [birth year] Referring Provider:   Flowsheet Row Pulmonary Rehab from 01/07/2021 in [facility] Cardiac and Pulmonary Rehab  Referring Provider Launa Flight       Initial Encounter Date:  Flowsheet Row Pulmonary Rehab from 01/07/2021 in [facility] Cardiac and Pulmonary Rehab  Date 01/07/21       Visit Diagnosis: Chronic obstructive pulmonary disease, unspecified COPD type ([place])  Patient's Home Medications on Admission:  Current Outpatient Medications:    Adalimumab (HUMIRA PEN) 40 MG/0.4ML PNKT, Inject into the skin., Disp: , Rfl:    albuterol (PROAIR HFA) 108 (90 Base) MCG/ACT inhaler, INHALE 2 PUFFS BY MOUTH EVERY 4 HOURS AS NEEDED FOR  WHEEZING  OR  SHORTNESS  OF  BREATH, Disp: 27 g, Rfl: 0   albuterol (PROVENTIL) (2.5 MG/3ML) 0.083% nebulizer solution, Take 3 mLs (2.5 mg total) by nebulization every 6 (six) hours as needed for wheezing or shortness of breath., Disp: 75 mL, Rfl: 0   folic acid (FOLVITE) 1 MG tablet, Take 1 mg by mouth daily. (Patient not taking: Reported on 01/01/2021), Disp: , Rfl:    gabapentin (NEURONTIN) 100 MG capsule, Take 1 capsule (100 mg total) by mouth 2 (two) times daily. (Patient not taking: Reported on 01/01/2021), Disp: 90 capsule, Rfl: 3   lisinopril-hydrochlorothiazide (ZESTORETIC) 10-12.5 MG tablet, Take 1 tablet by mouth once daily, Disp: 90 tablet, Rfl: 0   methotrexate 2.5 MG tablet, Take 8 tablets by mouth once a week. 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   Took shots for awhile but not now   Rheumatoid arthritis ([place])    Substance abuse ([place])    Tobacco use     Tobacco Use: Social History   Tobacco Use  Smoking Status Every Day   Packs/day: 0.25   Years: 51.00   Pack years: 12.75   Types: Cigarettes, Cigars  Smokeless Tobacco Never  Tobacco Comments   Wants to quit no quit date set    Labs: Recent Review Flowsheet Data     Labs for ITP Cardiac and Pulmonary Rehab Latest Ref Rng & Units 12/05/2017 07/30/2019 07/30/2019 07/31/2019 05/28/2020   Hemoglobin A1c 4.8 - 5.6 % 5.7(H) - - 5.7(H) 5.1   HCO3 20.0 - 28.0 mmol/L - 22.3 23.0 - -   ACIDBASEDEF 0.0 - 2.0 mmol/L - 7.5(H) 4.1(H) - -   O2SAT % - 59.2 67.1 - -        Pulmonary Assessment Scores:  Pulmonary Assessment Scores     Row Name 01/07/21 1004         ADL UCSD   SOB Score total 55     Rest 1     Walk 3     Stairs 5     Bath 0     Dress 3     Shop 5       CAT  Score   CAT Score 22       mMRC Score   mMRC Score 3              UCSD: Self-administered rating of dyspnea associated with activities of daily living (ADLs) 6-point scale (0 = "not at all" to 5 = "maximal or unable to do because of breathlessness")  Scoring Scores range from 0 to 120.  Minimally important difference is 5 units  CAT: CAT can identify the health impairment of COPD patients and is better correlated with disease progression.  CAT has a scoring range of zero to 40. The CAT score is classified into four groups of low (less than 10), medium (10 - 20), high (21-30) and very high (31-40) based on the impact level of disease on health status. A CAT score over 10 suggests significant symptoms.  A worsening CAT score could be explained by an exacerbation, poor medication adherence, poor inhaler technique, or progression of COPD or comorbid conditions.  CAT MCID is 2 points  mMRC: mMRC  (Modified Medical Research Council) Dyspnea Scale is used to assess the degree of baseline functional disability in patients of respiratory disease due to dyspnea. No minimal important difference is established. A decrease in score of 1 point or greater is considered a positive change.   Pulmonary Function Assessment:  Pulmonary Function Assessment - 01/07/21 1005       Post Bronchodilator Spirometry Results   FVC% 89.4 %    FEV1% 76.4 %    FEV1/FVC Ratio 65    Comments 12/18/20             Exercise Target Goals: Exercise Program Goal: Individual exercise prescription set using results from initial 6 min walk test and THRR while considering  patient's activity barriers and safety.   Exercise Prescription Goal: Initial exercise prescription builds to 30-45 minutes a day of aerobic activity, 2-3 days per week.  Home exercise guidelines will be given to patient during program as part of exercise prescription that the participant will acknowledge.  Education: Aerobic Exercise: - Group verbal and visual presentation on the components of exercise prescription. Introduces F.I.T.T principle from ACSM for exercise prescriptions.  Reviews F.I.T.T. principles of aerobic exercise including progression. Written material given at graduation. Flowsheet Row Pulmonary Rehab from 01/07/2021 in [facility] Cardiac and Pulmonary Rehab  Education need identified 01/07/21       Education: Resistance Exercise: - Group verbal and visual presentation on the components of exercise prescription. Introduces F.I.T.T principle from ACSM for exercise prescriptions  Reviews F.I.T.T. principles of resistance exercise including progression. Written material given at graduation.    Education: Exercise & Equipment Safety: - Individual verbal instruction and demonstration of equipment use and safety with use of the equipment. Flowsheet Row Pulmonary Rehab from 01/07/2021 in [facility] Cardiac and Pulmonary Rehab  Date 01/07/21   Educator [facility]  Instruction Review Code 1- Verbalizes Understanding       Education: Exercise Physiology & General Exercise Guidelines: - Group verbal and written instruction with models to review the exercise physiology of the cardiovascular system and associated critical values. Provides general exercise guidelines with specific guidelines to those with heart or lung disease.    Education: Flexibility, Balance, Mind/Body Relaxation: - Group verbal and visual presentation with interactive activity on the components of exercise prescription. Introduces F.I.T.T principle from ACSM for exercise prescriptions. Reviews F.I.T.T. principles of flexibility and balance exercise training including progression. Also discusses the mind body connection.  Reviews various relaxation techniques to  help reduce and manage stress (i.e. Deep breathing, progressive muscle relaxation, and visualization). Balance handout provided to take home. Written material given at graduation.   Activity Barriers & Risk Stratification:  Activity Barriers & Cardiac Risk Stratification - 01/01/21 1023       Activity Barriers & Cardiac Risk Stratification   Activity Barriers Arthritis;Balance Concerns;Assistive Device   cane   walker            6 Minute Walk:  Oxygen Initial Assessment:  Oxygen Initial Assessment - 01/01/21 1025       Home Oxygen   Home Oxygen Device None    Sleep Oxygen Prescription None    Home Exercise Oxygen Prescription None    Home Resting Oxygen Prescription None    Compliance with Home Oxygen Use Yes      Intervention   Short Term Goals To learn and demonstrate proper pursed lip breathing techniques or other breathing techniques. ;To learn and understand importance of maintaining oxygen saturations>88%;To learn and demonstrate proper use of respiratory medications    Long  Term Goals Exhibits proper breathing techniques, such as pursed lip breathing or other method taught during program  session;Compliance with respiratory medication;Demonstrates proper use of MDI's             Oxygen Re-Evaluation:   Oxygen Discharge (Final Oxygen Re-Evaluation):   Initial Exercise Prescription:  Initial Exercise Prescription - 01/07/21 0900       Date of Initial Exercise RX and Referring Provider   Date 01/07/21    Referring Provider Launa Flight      Oxygen   Maintain Oxygen Saturation 88% or higher      Treadmill   MPH 1.3    Grade 0    Minutes 15    METs 2      Recumbant Elliptical   Level 1    RPM 50    Minutes 15    METs 2.16      Biostep-RELP   Level 1    SPM 50    Minutes 15    METs 2.16      Track   Laps 15    Minutes 15    METs 1.82      Prescription Details   Frequency (times per week) 2    Duration Progress to 30 minutes of continuous aerobic without signs/symptoms of physical distress      Intensity   THRR 40-80% of Max Heartrate 107-135    Ratings of Perceived Exertion 11-13    Perceived Dyspnea 0-4      Resistance Training   Training Prescription Yes    Weight 3    Reps 10-15             Perform Capillary Blood Glucose checks as needed.  Exercise Prescription Changes:   Exercise Prescription Changes     Row Name 01/07/21 0900             Response to Exercise   Blood Pressure (Admit) 156/96       Blood Pressure (Exercise) 170/88       Blood Pressure (Exit) 150/88       Heart Rate (Admit) 81 bpm       Heart Rate (Exercise) 111 bpm       Heart Rate (Exit) 78 bpm       Oxygen Saturation (Admit) 96 %       Oxygen Saturation (Exercise) 94 %       Oxygen Saturation (Exit) 97 %  Rating of Perceived Exertion (Exercise) 11       Perceived Dyspnea (Exercise) 1       Symptoms dizziness       Comments walk test results                Exercise Comments:   Exercise Goals and Review:   Exercise Goals     Row Name 01/07/21 0957             Exercise Goals   Increase Physical Activity Yes        Intervention Provide advice, education, support and counseling about physical activity/exercise needs.;Develop an individualized exercise prescription for aerobic and resistive training based on initial evaluation findings, risk stratification, comorbidities and participant's personal goals.       Expected Outcomes Short Term: Attend rehab on a regular basis to increase amount of physical activity.;Long Term: Add in home exercise to make exercise part of routine and to increase amount of physical activity.;Long Term: Exercising regularly at least 3-5 days a week.       Increase Strength and Stamina Yes       Intervention Provide advice, education, support and counseling about physical activity/exercise needs.;Develop an individualized exercise prescription for aerobic and resistive training based on initial evaluation findings, risk stratification, comorbidities and participant's personal goals.       Expected Outcomes Short Term: Increase workloads from initial exercise prescription for resistance, speed, and METs.;Short Term: Perform resistance training exercises routinely during rehab and add in resistance training at home;Long Term: Improve cardiorespiratory fitness, muscular endurance and strength as measured by increased METs and functional capacity (6MWT)       Able to understand and use rate of perceived exertion (RPE) scale Yes       Intervention Provide education and explanation on how to use RPE scale       Expected Outcomes Long Term:  Able to use RPE to guide intensity level when exercising independently       Able to understand and use Dyspnea scale Yes       Intervention Provide education and explanation on how to use Dyspnea scale       Expected Outcomes Short Term: Able to use Dyspnea scale daily in rehab to express subjective sense of shortness of breath during exertion;Long Term: Able to use Dyspnea scale to guide intensity level when exercising independently       Knowledge and  understanding of Target Heart Rate Range (THRR) Yes       Intervention Provide education and explanation of THRR including how the numbers were predicted and where they are located for reference       Expected Outcomes Short Term: Able to state/look up THRR;Long Term: Able to use THRR to govern intensity when exercising independently;Short Term: Able to use daily as guideline for intensity in rehab       Able to check pulse independently Yes       Intervention Provide education and demonstration on how to check pulse in carotid and radial arteries.;Review the importance of being able to check your own pulse for safety during independent exercise       Expected Outcomes Short Term: Able to explain why pulse checking is important during independent exercise;Long Term: Able to check pulse independently and accurately       Understanding of Exercise Prescription Yes       Intervention Provide education, explanation, and written materials on patient's individual exercise prescription  Expected Outcomes Short Term: Able to explain program exercise prescription;Long Term: Able to explain home exercise prescription to exercise independently                Exercise Goals Re-Evaluation :   Discharge Exercise Prescription (Final Exercise Prescription Changes):  Exercise Prescription Changes - 01/07/21 0900       Response to Exercise   Blood Pressure (Admit) 156/96    Blood Pressure (Exercise) 170/88    Blood Pressure (Exit) 150/88    Heart Rate (Admit) 81 bpm    Heart Rate (Exercise) 111 bpm    Heart Rate (Exit) 78 bpm    Oxygen Saturation (Admit) 96 %    Oxygen Saturation (Exercise) 94 %    Oxygen Saturation (Exit) 97 %    Rating of Perceived Exertion (Exercise) 11    Perceived Dyspnea (Exercise) 1    Symptoms dizziness    Comments walk test results             Nutrition:  Target Goals: Understanding of nutrition guidelines, daily intake of sodium 1500mg , cholesterol 200mg ,  calories 30% from fat and 7% or less from saturated fats, daily to have 5 or more servings of fruits and vegetables.  Education: All About Nutrition: -Group instruction provided by verbal, written material, interactive activities, discussions, models, and posters to present general guidelines for heart healthy nutrition including fat, fiber, MyPlate, the role of sodium in heart healthy nutrition, utilization of the nutrition label, and utilization of this knowledge for meal planning. Follow up email sent as well. Written material given at graduation.   Biometrics:  Pre Biometrics - 01/07/21 0958       Pre Biometrics   Height 5' 8.5" (1.74 m)    Weight 187 lb 8 oz (85 kg)    BMI (Calculated) 28.09    Single Leg Stand 0 seconds              Nutrition Therapy Plan and Nutrition Goals:  Nutrition Therapy & Goals - 01/07/21 1001       Intervention Plan   Intervention Prescribe, educate and counsel regarding individualized specific dietary modifications aiming towards targeted core components such as weight, hypertension, lipid management, diabetes, heart failure and other comorbidities.    Expected Outcomes Short Term Goal: Understand basic principles of dietary content, such as calories, fat, sodium, cholesterol and nutrients.;Short Term Goal: A plan has been developed with personal nutrition goals set during dietitian appointment.;Long Term Goal: Adherence to prescribed nutrition plan.             Nutrition Assessments:  MEDIFICTS Score Key: ?70 Need to make dietary changes  40-70 Heart Healthy Diet ? 40 Therapeutic Level Cholesterol Diet  Flowsheet Row Pulmonary Rehab from 01/07/2021 in [facility] Cardiac and Pulmonary Rehab  Picture Your Plate Total Score on Admission 49      Picture Your Plate Scores: <92 Unhealthy dietary pattern with much room for improvement. 41-50 Dietary pattern unlikely to meet recommendations for good health and room for improvement. 51-60 More  healthful dietary pattern, with some room for improvement.  >60 Healthy dietary pattern, although there may be some specific behaviors that could be improved.   Nutrition Goals Re-Evaluation:   Nutrition Goals Discharge (Final Nutrition Goals Re-Evaluation):   Psychosocial: Target Goals: Acknowledge presence or absence of significant depression and/or stress, maximize coping skills, provide positive support system. Participant is able to verbalize types and ability to use techniques and skills needed for reducing stress and depression.  Education: Stress, Anxiety, and Depression - Group verbal and visual presentation to define topics covered.  Reviews how body is impacted by stress, anxiety, and depression.  Also discusses healthy ways to reduce stress and to treat/manage anxiety and depression.  Written material given at graduation.   Education: Sleep Hygiene -Provides group verbal and written instruction about how sleep can affect your health.  Define sleep hygiene, discuss sleep cycles and impact of sleep habits. Review good sleep hygiene tips.    Initial Review & Psychosocial Screening:  Initial Psych Review & Screening - 01/01/21 1027       Initial Review   Current issues with History of Depression;Current Stress Concerns    Source of Stress Concerns Chronic Illness;Transportation    Comments Does not drive, relies on others  Gets down at times      [place]? Yes   sister     Barriers   Psychosocial barriers to participate in program There are no identifiable barriers or psychosocial needs.;The patient should benefit from training in stress management and relaxation.      Screening Interventions   Interventions Encouraged to exercise;Provide feedback about the scores to participant;To provide support and resources with identified psychosocial needs    Expected Outcomes Short Term goal: Utilizing psychosocial counselor, staff and physician to  assist with identification of specific Stressors or current issues interfering with healing process. Setting desired goal for each stressor or current issue identified.;Long Term Goal: Stressors or current issues are controlled or eliminated.;Short Term goal: Identification and review with participant of any Quality of Life or Depression concerns found by scoring the questionnaire.;Long Term goal: The participant improves quality of Life and PHQ9 Scores as seen by post scores and/or verbalization of changes             Quality of Life Scores:  Scores of 19 and below usually indicate a poorer quality of life in these areas.  A difference of  2-3 points is a clinically meaningful difference.  A difference of 2-3 points in the total score of the Quality of Life Index has been associated with significant improvement in overall quality of life, self-image, physical symptoms, and general health in studies assessing change in quality of life.  PHQ-9: Recent Review Flowsheet Data     Depression screen [facility] 2/9 01/07/2021 11/22/2020 11/22/2020 08/25/2020 05/28/2020   Decreased Interest 1 0 0 2 0   Down, Depressed, Hopeless 1 0 0 1 0   PHQ - 2 Score 2 0 0 3 0   Altered sleeping 0 1 - 0 0   Tired, decreased energy 3 1 - 3 1   Change in appetite 0 0 - 2 0   Feeling bad or failure about yourself  0 0 - 1 0   Trouble concentrating 1 0 - 0 0   Moving slowly or fidgety/restless 1 0 - 0 0   Suicidal thoughts 0 0 - 0 0   PHQ-9 Score 7 2 - 9 1   Difficult doing work/chores Somewhat difficult Not difficult at all - Somewhat difficult Somewhat difficult      Interpretation of Total Score  Total Score Depression Severity:  1-4 = Minimal depression, 5-9 = Mild depression, 10-14 = Moderate depression, 15-19 = Moderately severe depression, 20-27 = Severe depression   Psychosocial Evaluation and Intervention:  Psychosocial Evaluation - 01/01/21 1044       Psychosocial Evaluation & Interventions    Interventions Encouraged to exercise with  the program and follow exercise prescription    Comments JAme "Boodoo" is ready to join the program. He hopes to gain more walking strength and be able to walk to the store or tohis buddy's house. He does smoke cigarettes and want s to quit. No quit date yet. He only smokes 2-4 cigarettes a day. 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 We will work with him on smoking cessation. He lives with his sister. He does not drive and relies on others for transportation. He does get down at times when he finds himself at home without access to going somewhere to enjoy the day.    Expected Outcomes STG  Horst attends all scheduled sessions, he improves his ability to get around so he can go visit friends and walk to store. LTG Codylee will continue to progress after discharge.    Continue Psychosocial Services  Follow up required by staff             Psychosocial Re-Evaluation:   Psychosocial Discharge (Final Psychosocial Re-Evaluation):   Education: Education Goals: Education classes will be provided on a weekly basis, covering required topics. Participant will state understanding/return demonstration of topics presented.  Learning Barriers/Preferences:   General Pulmonary Education Topics:  Infection Prevention: - Provides verbal and written material to individual with discussion of infection control including proper hand washing and proper equipment cleaning during exercise session. Flowsheet Row Pulmonary Rehab from 01/07/2021 in [facility] Cardiac and Pulmonary Rehab  Date 01/07/21  Educator [facility]  Instruction Review Code 1- Verbalizes Understanding       Falls Prevention: - Provides verbal and written material to individual with discussion of falls prevention and safety. Flowsheet Row Pulmonary Rehab from 01/07/2021 in [facility] Cardiac and Pulmonary Rehab  Date 01/07/21  Educator [facility]  Instruction Review Code 1- Verbalizes  Understanding       Chronic Lung Disease Review: - Group verbal instruction with posters, models, PowerPoint presentations and videos,  to review new updates, new respiratory medications, new advancements in procedures and treatments. Providing information on websites and "800" numbers for continued self-education. Includes information about supplement oxygen, available portable oxygen systems, continuous and intermittent flow rates, oxygen safety, concentrators, and Medicare reimbursement for oxygen. Explanation of Pulmonary Drugs, including class, frequency, complications, importance of spacers, rinsing mouth after steroid MDI's, and proper cleaning methods for nebulizers. Review of basic lung anatomy and physiology related to function, structure, and complications of lung disease. Review of risk factors. Discussion about methods for diagnosing sleep apnea and types of masks and machines for OSA. Includes a review of the use of types of environmental controls: home humidity, furnaces, filters, dust mite/pet prevention, HEPA vacuums. Discussion about weather changes, air quality and the benefits of nasal washing. Instruction on Warning signs, infection symptoms, calling MD promptly, preventive modes, and value of vaccinations. Review of effective airway clearance, coughing and/or vibration techniques. Emphasizing that all should Create an Action Plan. Written material given at graduation. Flowsheet Row Pulmonary Rehab from 01/07/2021 in [facility] Cardiac and Pulmonary Rehab  Education need identified 01/07/21       AED/CPR: - Group verbal and written instruction with the use of models to demonstrate the basic use of the AED with the basic ABC's of resuscitation.    Anatomy and Cardiac Procedures: - Group verbal and visual presentation and models provide information about basic cardiac anatomy and function. Reviews the testing methods done to diagnose heart disease and the outcomes of  the test results.  Describes the treatment choices: Medical Management, Angioplasty, or Coronary Bypass Surgery for treating various heart conditions including Myocardial Infarction, Angina, Valve Disease, and Cardiac Arrhythmias.  Written material given at graduation.   Medication Safety: - Group verbal and visual instruction to review commonly prescribed medications for heart and lung disease. Reviews the medication, class of the drug, and side effects. Includes the steps to properly store meds and maintain the prescription regimen.  Written material given at graduation.   Other: -Provides group and verbal instruction on various topics (see comments)   Knowledge Questionnaire Score:  Knowledge Questionnaire Score - 01/07/21 1001       Knowledge Questionnaire Score   Pre Score 8/18              Core Components/Risk Factors/Patient Goals at Admission:  Personal Goals and Risk Factors at Admission - 01/07/21 0959       Core Components/Risk Factors/Patient Goals on Admission    Weight Management Yes;Weight Maintenance    Intervention Weight Management: Develop a combined nutrition and exercise program designed to reach desired caloric intake, while maintaining appropriate intake of nutrient and fiber, sodium and fats, and appropriate energy expenditure required for the weight goal.;Weight Management: Provide education and appropriate resources to help participant work on and attain dietary goals.    Admit Weight 187 lb 8 oz (85 kg)    Goal Weight: Short Term 180 lb (81.6 kg)    Goal Weight: Long Term 180 lb (81.6 kg)    Expected Outcomes Short Term: Continue to assess and modify interventions until short term weight is achieved;Long Term: Adherence to nutrition and physical activity/exercise program aimed toward attainment of established weight goal;Weight Maintenance: Understanding of the daily nutrition guidelines, which includes 25-35% calories from fat, 7% or less cal from saturated fats, less than  200mg  cholesterol, less than 1.5gm of sodium, & 5 or more servings of fruits and vegetables daily    Tobacco Cessation Yes    Number of packs per day Verl is a current tobacco user. Intervention for tobacco cessation was provided at the initial medical review. He was asked about readiness to quit and reported he is ready to quit . Patient was advised and educated about tobacco cessation using combination therapy, tobacco cessation classes, quit line, and quit smoking apps. Patient demonstrated understanding of this material. Staff will continue to provide encouragement and follow up with the patient throughout the program.    Intervention Assist the participant in steps to quit. Provide individualized education and counseling about committing to Tobacco Cessation, relapse prevention, and pharmacological support that can be provided by physician.;Advice worker, assist with locating and accessing local/national Quit Smoking programs, and support quit date choice.    Expected Outcomes Short Term: Will demonstrate readiness to quit, by selecting a quit date.;Short Term: Will quit all tobacco product use, adhering to prevention of relapse plan.;Long Term: Complete abstinence from all tobacco products for at least 12 months from quit date.    Improve shortness of breath with ADL's Yes    Intervention Provide education, individualized exercise plan and daily activity instruction to help decrease symptoms of SOB with activities of daily living.    Expected Outcomes Short Term: Improve cardiorespiratory fitness to achieve a reduction of symptoms when performing ADLs;Long Term: Be able to perform more ADLs without symptoms or delay the onset of symptoms    Increase knowledge of respiratory medications and ability to use respiratory devices properly  Yes  Intervention Provide education and demonstration as needed of appropriate use of medications, inhalers, and oxygen therapy.    Expected Outcomes  Short Term: Achieves understanding of medications use. Understands that oxygen is a medication prescribed by physician. Demonstrates appropriate use of inhaler and oxygen therapy.;Long Term: Maintain appropriate use of medications, inhalers, and oxygen therapy.    Heart Failure Yes    Intervention Provide a combined exercise and nutrition program that is supplemented with education, support and counseling about heart failure. Directed toward relieving symptoms such as shortness of breath, decreased exercise tolerance, and extremity edema.    Expected Outcomes Improve functional capacity of life;Short term: Attendance in program 2-3 days a week with increased exercise capacity. Reported lower sodium intake. Reported increased fruit and vegetable intake. Reports medication compliance.;Short term: Daily weights obtained and reported for increase. Utilizing diuretic protocols set by physician.;Long term: Adoption of self-care skills and reduction of barriers for early signs and symptoms recognition and intervention leading to self-care maintenance.    Hypertension Yes    Intervention Provide education on lifestyle modifcations including regular physical activity/exercise, weight management, moderate sodium restriction and increased consumption of fresh fruit, vegetables, and low fat dairy, alcohol moderation, and smoking cessation.;Monitor prescription use compliance.    Expected Outcomes Short Term: Continued assessment and intervention until BP is < 140/87mm HG in hypertensive participants. < 130/22mm HG in hypertensive participants with diabetes, heart failure or chronic kidney disease.;Long Term: Maintenance of blood pressure at goal levels.             Education:Diabetes - Individual verbal and written instruction to review signs/symptoms of diabetes, desired ranges of glucose level fasting, after meals and with exercise. Acknowledge that pre and post exercise glucose checks will be done for 3  sessions at entry of program.   Know Your Numbers and Heart Failure: - Group verbal and visual instruction to discuss disease risk factors for cardiac and pulmonary disease and treatment options.  Reviews associated critical values for Overweight/Obesity, Hypertension, Cholesterol, and Diabetes.  Discusses basics of heart failure: signs/symptoms and treatments.  Introduces Heart Failure Zone chart for action plan for heart failure.  Written material given at graduation.   Core Components/Risk Factors/Patient Goals Review:    Core Components/Risk Factors/Patient Goals at Discharge (Final Review):    ITP Comments:  ITP Comments     Row Name 01/01/21 1052 01/07/21 0951 01/27/21 1147 02/03/21 0617 02/11/21 1439   ITP Comments Virtual orientation call completed today. he has an appointment on Date: 01/07/2021  for EP eval and gym Orientation.  Documentation of diagnosis can be found in [facility]  Date: 10/14/2022[name] is a current tobacco user. Intervention for tobacco cessation was provided at the initial medical review. He was asked about readiness to quit and reported he is ready to quit . Patient was advised and educated about tobacco cessation using combination therapy, tobacco cessation classes, quit line, and quit smoking apps. Patient demonstrated understanding of this material. Staff will continue to provide encouragement and follow up with the patient throughout the program. Completed 6MWT and gym orientation. Initial ITP created and sent for review to Dr. Zetta Bills, Medical Director. Patient has not attended his 1st day yet due to transportation. He is looking to connect with services to help. 30 Day review completed. Medical Director ITP review done, changes made as directed, and signed approval by Medical Director.   New to program Patient unable to get transportation- discharge at this time. Patient in agreement. Will try to get a  new referral when he is ready.            Comments:  Discharge ITP

## 2021-02-16 ENCOUNTER — Ambulatory Visit: Payer: Medicare Other

## 2021-02-18 ENCOUNTER — Ambulatory Visit: Payer: Medicare Other

## 2021-02-23 ENCOUNTER — Ambulatory Visit: Payer: Medicare Other

## 2021-02-25 ENCOUNTER — Ambulatory Visit: Payer: Medicare Other

## 2021-03-02 ENCOUNTER — Ambulatory Visit: Payer: Medicare Other

## 2021-03-04 ENCOUNTER — Ambulatory Visit: Payer: Medicare Other

## 2021-03-09 ENCOUNTER — Ambulatory Visit: Payer: Medicare Other

## 2021-03-11 ENCOUNTER — Ambulatory Visit: Payer: Medicare Other

## 2021-03-16 ENCOUNTER — Ambulatory Visit: Payer: Medicare Other

## 2021-03-18 ENCOUNTER — Ambulatory Visit: Payer: Medicare Other

## 2021-03-23 ENCOUNTER — Ambulatory Visit: Payer: Medicare Other

## 2021-03-23 ENCOUNTER — Ambulatory Visit: Payer: Self-pay | Admitting: *Deleted

## 2021-03-23 ENCOUNTER — Ambulatory Visit: Payer: Medicare Other | Admitting: Family Medicine

## 2021-03-23 NOTE — Telephone Encounter (Signed)
° ° °  Chief Complaint: black sores to skin on hands, feet , legs and hair falling out in chunks, request appt  Symptoms: hair falling out in chunks, skin turns black and peels off to hands, feet, top of legs, toenails falling off great toes, severe itching and elbows skin rough as sand paper per patient .  Frequency: couple of months  Pertinent Negatives: Patient denies fever, drainage or pain Disposition: [] ED /[] Urgent Care (no appt availability in office) / [x] Appointment(In office/virtual)/ []  [place] [facility]/ [] Home Care/ [] Refused Recommended Disposition /[] [place] [facility]/ []  Follow-up with PCP Additional Notes:  Appt scheduled for today at 1:20pm    Reason for Disposition  [1] Unexplained sores AND [2] 3 or more  Answer Assessment - Initial Assessment Questions 1. APPEARANCE of SORES: "What do the sores look like?"     Skin turns black and peels off  2. NUMBER: "How many sores are there?"     Multiple areas 3. SIZE: "How big is the largest sore?"     na 4. LOCATION: "Where are the sores located?"     Hands, feet , top of legs, toenails falling off,  5. ONSET: "When did the sores begin?"     Couple of months ago  6. CAUSE: "What do you think is causing the sores?"     Not sure  7. OTHER SYMPTOMS: "Do you have any other symptoms?" (e.g., fever, new weakness)     Hair falling out in chunks, severe itching to face , elbows. Skin to elbows "like sand paper" rough feeling.  Protocols used: [employer]

## 2021-03-25 ENCOUNTER — Ambulatory Visit: Payer: Medicare Other

## 2021-03-30 ENCOUNTER — Other Ambulatory Visit: Payer: Self-pay

## 2021-03-30 ENCOUNTER — Ambulatory Visit: Payer: Medicare Other

## 2021-03-30 ENCOUNTER — Ambulatory Visit (INDEPENDENT_AMBULATORY_CARE_PROVIDER_SITE_OTHER): Payer: Medicare Other | Admitting: Family Medicine

## 2021-03-30 ENCOUNTER — Encounter: Payer: Self-pay | Admitting: Family Medicine

## 2021-03-30 VITALS — BP 144/100 | HR 100 | Ht 68.5 in | Wt 187.0 lb

## 2021-03-30 DIAGNOSIS — M069 Rheumatoid arthritis, unspecified: Secondary | ICD-10-CM | POA: Diagnosis not present

## 2021-03-30 DIAGNOSIS — I10 Essential (primary) hypertension: Secondary | ICD-10-CM

## 2021-03-30 DIAGNOSIS — L2084 Intrinsic (allergic) eczema: Secondary | ICD-10-CM

## 2021-03-30 MED ORDER — HYDROXYZINE HCL 10 MG PO TABS: 10.0000 mg | ORAL_TABLET | Freq: Three times a day (TID) | ORAL | 0 refills | Status: DC | PRN

## 2021-03-30 NOTE — Progress Notes (Signed)
Date:  03/30/2021   Name:  Daniel Frye   DOB:  [birth year]   MRN:  [id number]   Chief Complaint: Rash (Black, rough areas on hands, fingers that "itch really bad and peels off")  Rash This is a new problem. The current episode started more than 1 month ago. The problem has been waxing and waning since onset. The rash is diffuse. The rash is characterized by dryness (hyperpigmentation). Pertinent negatives include no cough, diarrhea, fever, shortness of breath or sore throat. Treatments tried: stopping multiple medication. The treatment provided moderate relief.  Hypertension This is a chronic problem. The current episode started more than 1 year ago. The problem has been waxing and waning since onset. The problem is uncontrolled. Pertinent negatives include no chest pain, headaches, neck pain, palpitations or shortness of breath. There are no associated agents to hypertension. Compliance problems: stop med on own.  There is no history of CAD/MI or CVA. There is no history of chronic renal disease, a hypertension causing med or renovascular disease.   Lab Results  Component Value Date   NA 134 08/25/2020   K 4.9 08/25/2020   CO2 16 (L) 08/25/2020   GLUCOSE 77 08/25/2020   BUN 70 (H) 08/25/2020   CREATININE 3.17 (H) 08/25/2020   CALCIUM 8.5 (L) 08/25/2020   EGFR 20 (L) 08/25/2020   GFRNONAA >60 10/09/2019   No results found for: CHOL, HDL, LDLCALC, LDLDIRECT, TRIG, CHOLHDL Lab Results  Component Value Date   TSH 0.476 08/25/2020   Lab Results  Component Value Date   HGBA1C 5.1 05/28/2020   Lab Results  Component Value Date   WBC 3.4 08/25/2020   HGB 12.6 (L) 08/25/2020   HCT 37.3 (L) 08/25/2020   MCV 96 08/25/2020   PLT 73 (LL) 08/25/2020   Lab Results  Component Value Date   ALT 15 08/25/2020   AST 15 08/25/2020   ALKPHOS 97 08/25/2020   BILITOT 0.3 08/25/2020   Lab Results  Component Value Date   VD25OH 11.1 (L) 01/26/2015     Review of Systems   Constitutional:  Negative for chills and fever.  HENT:  Negative for drooling, ear discharge, ear pain and sore throat.   Respiratory:  Negative for cough, shortness of breath and wheezing.   Cardiovascular:  Negative for chest pain, palpitations and leg swelling.  Gastrointestinal:  Negative for abdominal pain, blood in stool, constipation, diarrhea and nausea.  Endocrine: Negative for polydipsia.  Genitourinary:  Negative for dysuria, frequency, hematuria and urgency.  Musculoskeletal:  Negative for back pain, myalgias and neck pain.  Skin:  Positive for rash.  Allergic/Immunologic: Negative for environmental allergies.  Neurological:  Negative for dizziness and headaches.  Hematological:  Does not bruise/bleed easily.  Psychiatric/Behavioral:  Negative for suicidal ideas. The patient is not nervous/anxious.    Patient Active Problem List   Diagnosis Date Noted   Polyp of transverse colon    Chronic pain syndrome 01/14/2020   Pharmacologic therapy 01/14/2020   Disorder of skeletal system 01/14/2020   Problems influencing health status 01/14/2020   History of substance use disorder 01/14/2020   Abnormal drug screens 01/14/2020   History of alcohol abuse 01/14/2020   History of cocaine use 01/14/2020   History of marijuana use 01/14/2020   History of tobacco abuse 01/14/2020   Acute anaphylaxis 07/31/2019   Acute respiratory failure with hypoxia ([place]) 07/30/2019   DDD (degenerative disc disease), lumbar 01/07/2019   Cervical spondylosis with myelopathy 08/01/2018   Neck  pain 05/30/2018   Degenerative cervical spinal stenosis 05/29/2018   Aortic atherosclerosis ([place]) 05/15/2018   Depression 04/01/2018   Numbness and tingling in both hands 03/13/2018   Encounter for long-term (current) use of high-risk medication 08/09/2017   Primary osteoarthritis of both knees 08/09/2017   Hypokalemia 03/03/2016   Hypomagnesemia 03/03/2016   Substance induced mood disorder ([place]) 03/02/2016    Alcohol withdrawal ([place]) 02/29/2016   Crack cocaine use 01/22/2016   Obstructive sleep apnea of adult 03/30/2015   Edema of foot 03/30/2015   Chronic diastolic CHF (congestive heart failure), NYHA class 2 ([place]) 03/30/2015   Apnea, sleep 03/30/2015   Alcohol abuse 03/23/2015   Noncompliance 03/23/2015   Vitamin D deficiency 01/27/2015   Diabetes mellitus type 2, controlled, without complications ([place]) 28/78/6767   Obesity (BMI 30.0-34.9) 11/26/2014   COPD (chronic obstructive pulmonary disease) ([place]) 06/22/2014   Essential (primary) hypertension 06/22/2014   Rheumatoid arthritis involving multiple sites ([place]) 06/22/2014   Tobacco abuse 06/22/2014   Chronic obstructive pulmonary disease with acute exacerbation ([place]) 06/22/2014   History of drug abuse ([place]) 03/05/2009   SOB (shortness of breath) on exertion 03/05/2009   Alcohol abuse, daily use 03/05/2009   Cocaine dependence, abuse ([place]) 03/05/2009   Fever 03/05/2009   Wheezing 03/05/2009    Allergies  Allergen Reactions   Renflexis [Infliximab] Anaphylaxis    Past Surgical History:  Procedure Laterality Date   BACK SURGERY     COLONOSCOPY WITH PROPOFOL N/A 12/31/2020   Procedure: COLONOSCOPY WITH PROPOFOL;  Surgeon: [name], MD;  Location: [facility] ENDOSCOPY;  Service: Endoscopy;  Laterality: N/A;   heart surgery to drain fluid N/A 03/07/1985   Heart surgery to remove fluid     Social History   Tobacco Use   Smoking status: Every Day    Packs/day: 0.25    Years: 51.00    Pack years: 12.75    Types: Cigarettes, Cigars   Smokeless tobacco: Never   Tobacco comments:    Wants to quit no quit date set  Vaping Use   Vaping Use: Never used  Substance Use Topics   Alcohol use: Yes    Alcohol/week: 61.0 standard drinks    Types: 12 Cans of beer, 49 Standard drinks or equivalent per week    Comment: Drinks 1/5 day of wine    Drug use: Yes    Types: "Crack" cocaine, Marijuana    Comment: no drugs since 6 months      Medication list has been reviewed and updated.  Current Meds  Medication Sig   albuterol (PROAIR HFA) 108 (90 Base) MCG/ACT inhaler INHALE 2 PUFFS BY MOUTH EVERY 4 HOURS AS NEEDED FOR  WHEEZING  OR  SHORTNESS  OF  BREATH   albuterol (PROVENTIL) (2.5 MG/3ML) 0.083% nebulizer solution Take 3 mLs (2.5 mg total) by nebulization every 6 (six) hours as needed for wheezing or shortness of breath.   TRELEGY ELLIPTA 100-62.5-25 MCG/INH AEPB SMARTSIG:1 Via Inhaler Daily PRN    PHQ 2/9 Scores 01/07/2021 11/22/2020 11/22/2020 08/25/2020  PHQ - 2 Score 2 0 0 3  PHQ- 9 Score 7 2 - 9    GAD 7 : Generalized Anxiety Score 11/22/2020 08/25/2020 05/28/2020 10/02/2019  Nervous, Anxious, on Edge 0 0 0 0  Control/stop worrying 0 0 0 0  Worry too much - different things 0 0 0 0  Trouble relaxing 0 0 0 0  Restless 0 0 0 0  Easily annoyed or irritable 0 0 0 0  Afraid -  awful might happen 0 1 0 0  Total GAD 7 Score 0 1 0 0  Anxiety Difficulty Not difficult at all Somewhat difficult - -    BP Readings from Last 3 Encounters:  03/30/21 (!) 144/100  12/31/20 (!) 152/117  08/25/20 128/76    Physical Exam Vitals and nursing note reviewed.  HENT:     Head: Normocephalic.     Right Ear: External ear normal.     Left Ear: External ear normal.     Nose: Nose normal.  Eyes:     General: No scleral icterus.       Right eye: No discharge.        Left eye: No discharge.     Conjunctiva/sclera: Conjunctivae normal.     Pupils: Pupils are equal, round, and reactive to light.  Neck:     Thyroid: No thyromegaly.     Vascular: No JVD.     Trachea: No tracheal deviation.  Cardiovascular:     Rate and Rhythm: Normal rate and regular rhythm.     Heart sounds: Normal heart sounds. No murmur heard.   No friction rub. No gallop.  Pulmonary:     Effort: No respiratory distress.     Breath sounds: Normal breath sounds. No wheezing or rales.  Abdominal:     General: Bowel sounds are normal.     Palpations:  Abdomen is soft. There is no mass.     Tenderness: There is no abdominal tenderness. There is no guarding or rebound.  Musculoskeletal:        General: No tenderness. Normal range of motion.     Cervical back: Normal range of motion and neck supple.  Lymphadenopathy:     Cervical: No cervical adenopathy.  Skin:    General: Skin is warm.     Findings: No rash.  Neurological:     Mental Status: He is alert and oriented to person, place, and time.     Cranial Nerves: No cranial nerve deficit.     Deep Tendon Reflexes: Reflexes are normal and symmetric.    Wt Readings from Last 3 Encounters:  03/30/21 187 lb (84.8 kg)  01/07/21 187 lb 8 oz (85 kg)  12/31/20 179 lb 5.9 oz (81.4 kg)    BP (!) 144/100    Pulse 100    Ht 5' 8.5" (1.74 m)    Wt 187 lb (84.8 kg)    BMI 28.02 kg/m   Assessment and Plan:  1. Primary hypertension Chronic.  Uncontrolled.  Blood pressure today is 144/100 but is noted patient is not taking his medication elevated because patient has not been taking his medicine due to thinking that it may be causing his rash.  Patient will resume his current medication which is lisinopril hydrochlorothiazide 10-12.5 mg once a day.  We will recheck patient's blood pressure and 2 months  2. Intrinsic eczema New onset with areas of rash that are hyperpigmented which are pruritic which is generalized and.  This may be secondary to eczema and that it dries and then flakes off he says.  I am reluctant to put him on steroids and that he is off Humira and we want to get him back on this medication in the meantime we will use some low-dose Atarax 10 mg to use at the worst of itching and refer to dermatology for evaluation. - Ambulatory referral to Dermatology - hydrOXYzine (ATARAX) 10 MG tablet; Take 1 tablet (10 mg total) by mouth 3 (three) times  daily as needed.  Dispense: 30 tablet; Refill: 0  3. Rheumatoid arthritis, involving unspecified site, unspecified whether rheumatoid factor  present [facility]) Patient with history of rheumatoid arthritis and he also has stopped his Humira and methotrexate.  He has been having some issues being able to be contacted for delivery or shipping directions.  Patient has been given and his number at rheumatology to call and to reestablish contact in the meantime he is also been instructed to resume his Humira and his methotrexate given his history of significant rheumatoid arthritis.

## 2021-04-01 ENCOUNTER — Ambulatory Visit: Payer: Medicare Other

## 2021-04-06 ENCOUNTER — Telehealth: Payer: Self-pay

## 2021-04-06 ENCOUNTER — Telehealth: Payer: Self-pay | Admitting: Family Medicine

## 2021-04-06 ENCOUNTER — Ambulatory Visit: Payer: Medicare Other

## 2021-04-06 NOTE — Telephone Encounter (Signed)
Patient called in states, the hydrOXYzine (ATARAX) 10 MG tablet  given to him, is not helping his itch nd he wants to know what else he can do.

## 2021-04-06 NOTE — Telephone Encounter (Signed)
Blank note   KP

## 2021-04-06 NOTE — Telephone Encounter (Signed)
Patient called to ask about the medication Hydroxyzine not helping.He says no response from the hydroxyzine. He takes it as prescribed TID and it's not relieving the itching. He is asking for another mediation. I advised I will send this to Dr. Ronnald Ramp for recommendation.

## 2021-04-06 NOTE — Telephone Encounter (Signed)
Called pt and gave him the number to dermatology. The referral was sent over on 03/30/21 and he should've heard from the appt by now. He is going to call. Also, explained we do not prescribe the Humira pen for him, that is his rheumatologist

## 2021-04-08 ENCOUNTER — Ambulatory Visit: Payer: Medicare Other

## 2021-04-12 ENCOUNTER — Ambulatory Visit: Payer: Self-pay | Admitting: *Deleted

## 2021-04-12 NOTE — Telephone Encounter (Signed)
Informed patient of message.  

## 2021-04-12 NOTE — Telephone Encounter (Signed)
Reason for Disposition  [1] MODERATE-SEVERE widespread itching (i.e., interferes with sleep, normal activities or school) AND [2] not improved after 24 hours of itching Care Advice    Atarax not helping the itching.  Answer Assessment - Initial Assessment Questions 1. DESCRIPTION: "Describe the itching you are having."     The Atarax is not working for the itching.   He also tried cortisone cream on his legs that didn't help either.   His dermatology referral isn't until 04/28/2021.   2. SEVERITY: "How bad is it?"    - MILD - doesn't interfere with normal activities   - MODERATE-SEVERE: interferes with work, school, sleep, or other activities      Severe.    "I think it's getting worse" 3. SCRATCHING: "Are there any scratch marks? Bleeding?"     *No Answer* 4. ONSET: "When did this begin?"      This is an ongoing problem 5. CAUSE: "What do you think is causing the itching?" (ask about swimming pools, pollen, animals, soaps, etc.)     I don't know but it's terrible 6. OTHER SYMPTOMS: "Do you have any other symptoms?"      "My skin looks like it is rotten off of me".    7. PREGNANCY: "Is there any chance you are pregnant?" "When was your last menstrual period?"     N/A  Protocols used: Itching - Widespread-A-AH  Chief Complaint: Itching not improved with the Atarax.   Requesting something stronger Symptoms: Itching all over and not getting better with the Atarax Frequency: All the time Pertinent Negatives: Patient denies N/A Disposition: [] ED /[] Urgent Care (no appt availability in office) / [] Appointment(In office/virtual)/ []  [place] [facility]/ [] Home Care/ [] Refused Recommended Disposition /[] [place] [facility]/ [x]  Follow-up with PCP Additional Notes: Message sent to Dr. Otilio Miu.    Pt was agreeable to someone calling him back with Dr. Ronnald Ramp' recommendation.  I returned his call.    He called in c/o the Atarax he was given is not helping the itching at all.  He thinks  the itching may be getting worse.   He tried cortisone cream on his legs but it didn't work either.

## 2021-04-13 ENCOUNTER — Ambulatory Visit: Payer: Medicare Other

## 2021-04-15 ENCOUNTER — Ambulatory Visit: Payer: Medicare Other

## 2021-04-15 DIAGNOSIS — L298 Other pruritus: Secondary | ICD-10-CM | POA: Diagnosis not present

## 2021-04-15 DIAGNOSIS — L2089 Other atopic dermatitis: Secondary | ICD-10-CM | POA: Diagnosis not present

## 2021-04-20 ENCOUNTER — Ambulatory Visit: Payer: Medicare Other

## 2021-04-22 ENCOUNTER — Ambulatory Visit: Payer: Medicare Other

## 2021-04-27 ENCOUNTER — Ambulatory Visit: Payer: Medicare Other

## 2021-04-29 ENCOUNTER — Ambulatory Visit: Payer: Medicare Other

## 2021-05-04 ENCOUNTER — Ambulatory Visit: Payer: Medicare Other

## 2021-05-06 ENCOUNTER — Ambulatory Visit: Payer: Medicare Other

## 2021-06-25 ENCOUNTER — Other Ambulatory Visit: Payer: Self-pay | Admitting: Family Medicine

## 2021-06-25 DIAGNOSIS — L2084 Intrinsic (allergic) eczema: Secondary | ICD-10-CM

## 2021-06-28 ENCOUNTER — Other Ambulatory Visit: Payer: Self-pay

## 2021-06-28 DIAGNOSIS — L2084 Intrinsic (allergic) eczema: Secondary | ICD-10-CM

## 2021-08-03 ENCOUNTER — Other Ambulatory Visit: Payer: Self-pay | Admitting: Family Medicine

## 2021-08-03 DIAGNOSIS — R739 Hyperglycemia, unspecified: Secondary | ICD-10-CM

## 2021-08-09 ENCOUNTER — Encounter: Payer: Self-pay | Admitting: Family Medicine

## 2021-08-09 ENCOUNTER — Ambulatory Visit (INDEPENDENT_AMBULATORY_CARE_PROVIDER_SITE_OTHER): Payer: Medicare Other | Admitting: Family Medicine

## 2021-08-09 ENCOUNTER — Telehealth: Payer: Self-pay

## 2021-08-09 VITALS — BP 120/80 | HR 80 | Ht 68.5 in | Wt 186.0 lb

## 2021-08-09 DIAGNOSIS — Z87891 Personal history of nicotine dependence: Secondary | ICD-10-CM | POA: Diagnosis not present

## 2021-08-09 DIAGNOSIS — I10 Essential (primary) hypertension: Secondary | ICD-10-CM

## 2021-08-09 DIAGNOSIS — F32A Depression, unspecified: Secondary | ICD-10-CM

## 2021-08-09 DIAGNOSIS — R7303 Prediabetes: Secondary | ICD-10-CM | POA: Diagnosis not present

## 2021-08-09 MED ORDER — MIRTAZAPINE 15 MG PO TABS: 15.0000 mg | ORAL_TABLET | Freq: Every day | ORAL | 11 refills | Status: DC

## 2021-08-09 MED ORDER — LISINOPRIL-HYDROCHLOROTHIAZIDE 10-12.5 MG PO TABS: 1.0000 | ORAL_TABLET | Freq: Every day | ORAL | 5 refills | Status: DC

## 2021-08-09 NOTE — Telephone Encounter (Signed)
I spoke to pt in person and gave his sister the appt info for Dr Launa Flight at [facility] pulmonology. He will see him 09/10/21 @ 10:30- arrive 15 minutes ahead of time.

## 2021-08-09 NOTE — Progress Notes (Signed)
Date:  08/09/2021   Name:  Daniel Frye   DOB:  Nov 17, 1950   MRN:  [id number]   Chief Complaint: Hypertension, COPD, Depression, and Prediabetes  Hypertension This is a chronic problem. The current episode started more than 1 year ago. The problem has been gradually improving since onset. The problem is controlled. Pertinent negatives include no anxiety, blurred vision, chest pain, headaches, malaise/fatigue, neck pain, orthopnea, palpitations, peripheral edema, PND, shortness of breath or sweats. There are no associated agents to hypertension. Risk factors for coronary artery disease include dyslipidemia, diabetes mellitus and smoking/tobacco exposure. Past treatments include ACE inhibitors and diuretics. There are no compliance problems.  There is no history of chronic renal disease, a hypertension causing med or renovascular disease.  COPD He complains of cough and frequent throat clearing. There is no difficulty breathing, shortness of breath, sputum production or wheezing. This is a chronic problem. The current episode started more than 1 year ago. The cough is productive of sputum. Pertinent negatives include no chest pain, fever, headaches, malaise/fatigue, PND or sweats. His past medical history is significant for COPD.  Depression        This is a chronic problem.  The problem occurs intermittently.  The problem has been gradually improving since onset.  Associated symptoms include no helplessness, no hopelessness, not irritable, no restlessness, no headaches and not sad.   Pertinent negatives include no anxiety.  Lab Results  Component Value Date   NA 134 08/25/2020   K 4.9 08/25/2020   CO2 16 (L) 08/25/2020   GLUCOSE 77 08/25/2020   BUN 70 (H) 08/25/2020   CREATININE 3.17 (H) 08/25/2020   CALCIUM 8.5 (L) 08/25/2020   EGFR 20 (L) 08/25/2020   GFRNONAA >60 10/09/2019   No results found for: CHOL, HDL, LDLCALC, LDLDIRECT, TRIG, CHOLHDL Lab Results  Component Value Date   TSH  0.476 08/25/2020   Lab Results  Component Value Date   HGBA1C 5.1 05/28/2020   Lab Results  Component Value Date   WBC 3.4 08/25/2020   HGB 12.6 (L) 08/25/2020   HCT 37.3 (L) 08/25/2020   MCV 96 08/25/2020   PLT 73 (LL) 08/25/2020   Lab Results  Component Value Date   ALT 15 08/25/2020   AST 15 08/25/2020   ALKPHOS 97 08/25/2020   BILITOT 0.3 08/25/2020   Lab Results  Component Value Date   VD25OH 11.1 (L) 01/26/2015     Review of Systems  Constitutional:  Negative for fever and malaise/fatigue.  Eyes:  Negative for blurred vision.  Respiratory:  Positive for cough. Negative for sputum production, shortness of breath and wheezing.   Cardiovascular:  Negative for chest pain, palpitations, orthopnea and PND.  Musculoskeletal:  Negative for neck pain.  Neurological:  Negative for headaches.  Psychiatric/Behavioral:  Positive for depression.    Patient Active Problem List   Diagnosis Date Noted   Polyp of transverse colon    Chronic pain syndrome 01/14/2020   Pharmacologic therapy 01/14/2020   Disorder of skeletal system 01/14/2020   Problems influencing health status 01/14/2020   History of substance use disorder 01/14/2020   Abnormal drug screens 01/14/2020   History of alcohol abuse 01/14/2020   History of cocaine use 01/14/2020   History of marijuana use 01/14/2020   History of tobacco abuse 01/14/2020   Acute anaphylaxis 07/31/2019   Acute respiratory failure with hypoxia ([place]) 07/30/2019   DDD (degenerative disc disease), lumbar 01/07/2019   Cervical spondylosis with myelopathy 08/01/2018  Neck pain 05/30/2018   Degenerative cervical spinal stenosis 05/29/2018   Aortic atherosclerosis ([place]) 05/15/2018   Depression 04/01/2018   Numbness and tingling in both hands 03/13/2018   Encounter for long-term (current) use of high-risk medication 08/09/2017   Primary osteoarthritis of both knees 08/09/2017   Hypokalemia 03/03/2016   Hypomagnesemia 03/03/2016    Substance induced mood disorder ([place]) 03/02/2016   Alcohol withdrawal ([place]) 02/29/2016   Crack cocaine use 01/22/2016   Obstructive sleep apnea of adult 03/30/2015   Edema of foot 03/30/2015   Chronic diastolic CHF (congestive heart failure), NYHA class 2 ([place]) 03/30/2015   Apnea, sleep 03/30/2015   Alcohol abuse 03/23/2015   Noncompliance 03/23/2015   Vitamin D deficiency 01/27/2015   Diabetes mellitus type 2, controlled, without complications ([place]) 23/55/7322   Obesity (BMI 30.0-34.9) 11/26/2014   COPD (chronic obstructive pulmonary disease) ([place]) 06/22/2014   Essential (primary) hypertension 06/22/2014   Rheumatoid arthritis involving multiple sites ([place]) 06/22/2014   Tobacco abuse 06/22/2014   Chronic obstructive pulmonary disease with acute exacerbation ([place]) 06/22/2014   History of drug abuse ([place]) 03/05/2009   SOB (shortness of breath) on exertion 03/05/2009   Alcohol abuse, daily use 03/05/2009   Cocaine dependence, abuse ([place]) 03/05/2009   Fever 03/05/2009   Wheezing 03/05/2009    Allergies  Allergen Reactions   Renflexis [Infliximab] Anaphylaxis    Past Surgical History:  Procedure Laterality Date   BACK SURGERY     COLONOSCOPY WITH PROPOFOL N/A 12/31/2020   Procedure: COLONOSCOPY WITH PROPOFOL;  Surgeon: [name], MD;  Location: [facility] ENDOSCOPY;  Service: Endoscopy;  Laterality: N/A;   heart surgery to drain fluid N/A 03/07/1985   Heart surgery to remove fluid     Social History   Tobacco Use   Smoking status: Every Day    Packs/day: 0.25    Years: 51.00    Pack years: 12.75    Types: Cigarettes, Cigars   Smokeless tobacco: Never   Tobacco comments:    Wants to quit no quit date set  Vaping Use   Vaping Use: Never used  Substance Use Topics   Alcohol use: Yes    Alcohol/week: 61.0 standard drinks    Types: 12 Cans of beer, 49 Standard drinks or equivalent per week    Comment: Drinks 1/5 day of wine    Drug use: Not Currently    Comment: no drugs  in 1 year and 3 months     Medication list has been reviewed and updated.  Current Meds  Medication Sig   Adalimumab (HUMIRA PEN) 40 MG/0.4ML PNKT Inject into the skin.   albuterol (PROAIR HFA) 108 (90 Base) MCG/ACT inhaler INHALE 2 PUFFS BY MOUTH EVERY 4 HOURS AS NEEDED FOR  WHEEZING  OR  SHORTNESS  OF  BREATH   albuterol (PROVENTIL) (2.5 MG/3ML) 0.083% nebulizer solution Take 3 mLs (2.5 mg total) by nebulization every 6 (six) hours as needed for wheezing or shortness of breath.   lisinopril-hydrochlorothiazide (ZESTORETIC) 10-12.5 MG tablet Take 1 tablet by mouth once daily   mirtazapine (REMERON) 15 MG tablet Take by mouth.   TRELEGY ELLIPTA 100-62.5-25 MCG/INH AEPB SMARTSIG:1 Via Inhaler Daily PRN   [DISCONTINUED] hydrOXYzine (ATARAX) 10 MG tablet Take 1 tablet by mouth three times daily as needed       08/09/2021   11:17 AM 11/22/2020   11:19 AM 08/25/2020   11:18 AM 05/28/2020   11:23 AM  GAD 7 : Generalized Anxiety Score  Nervous, Anxious, on Edge 0  0 0 0  Control/stop worrying 0 0 0 0  Worry too much - different things 0 0 0 0  Trouble relaxing 0 0 0 0  Restless 0 0 0 0  Easily annoyed or irritable 0 0 0 0  Afraid - awful might happen 2 0 1 0  Total GAD 7 Score 2 0 1 0  Anxiety Difficulty Somewhat difficult Not difficult at all Somewhat difficult        08/09/2021   11:16 AM  Depression screen PHQ 2/9  Decreased Interest 0  Down, Depressed, Hopeless 1  PHQ - 2 Score 1  Altered sleeping 0  Tired, decreased energy 0  Change in appetite 3  Feeling bad or failure about yourself  0  Trouble concentrating 0  Moving slowly or fidgety/restless 0  Suicidal thoughts 0  PHQ-9 Score 4  Difficult doing work/chores Somewhat difficult    BP Readings from Last 3 Encounters:  08/09/21 120/80  03/30/21 (!) 144/100  12/31/20 (!) 152/117    Physical Exam Vitals reviewed.  Constitutional:      General: He is not irritable. HENT:     Head: Normocephalic.     Right Ear:  External ear normal.     Left Ear: External ear normal.     Nose: Nose normal.  Eyes:     General: No scleral icterus.       Right eye: No discharge.        Left eye: No discharge.     Conjunctiva/sclera: Conjunctivae normal.     Pupils: Pupils are equal, round, and reactive to light.  Neck:     Thyroid: No thyromegaly.     Vascular: No JVD.     Trachea: No tracheal deviation.  Cardiovascular:     Rate and Rhythm: Normal rate and regular rhythm.     Heart sounds: Normal heart sounds, S1 normal and S2 normal. No murmur heard. No systolic murmur is present.  No diastolic murmur is present.    No friction rub. No gallop. No S3 or S4 sounds.  Pulmonary:     Effort: No respiratory distress.     Breath sounds: Normal breath sounds. No decreased breath sounds, wheezing, rhonchi or rales.  Abdominal:     General: Bowel sounds are normal.     Palpations: Abdomen is soft. There is no mass.     Tenderness: There is no abdominal tenderness. There is no guarding or rebound.  Musculoskeletal:        General: No tenderness. Normal range of motion.     Cervical back: Normal range of motion and neck supple.  Lymphadenopathy:     Cervical: No cervical adenopathy.  Skin:    General: Skin is warm.     Findings: No rash.  Neurological:     Mental Status: He is alert.     Deep Tendon Reflexes: Reflexes are normal and symmetric.    Wt Readings from Last 3 Encounters:  08/09/21 186 lb (84.4 kg)  03/30/21 187 lb (84.8 kg)  01/07/21 187 lb 8 oz (85 kg)    BP 120/80   Pulse 80   Ht 5' 8.5" (1.74 m)   Wt 186 lb (84.4 kg)   BMI 27.87 kg/m   Assessment and Plan:  1. Primary hypertension Chronic.  Controlled.  Stable.  Blood pressure 120/80.  Continue lisinopril hydrochlorothiazide 10-12.5 mg once a day.  We will check CMP for electrolytes and GFR. - lisinopril-hydrochlorothiazide (ZESTORETIC) 10-12.5 MG tablet; Take 1 tablet  by mouth daily.  Dispense: 30 tablet; Refill: 5 - Comprehensive  Metabolic Panel (CMET)  2. Prediabetes Chronic.  Controlled.  Stable.  This is diet controlled.  A1c in the high 5 to low 6 readings generally. - Comprehensive Metabolic Panel (CMET) - HgB A1c  3. Depression, unspecified depression type Chronic.  Controlled.  PHQ is 0.  GAD score is 2.  This is controlled on mirtazapine 15 mg nightly and we will continue with current dosing. - mirtazapine (REMERON) 15 MG tablet; Take 1 tablet (15 mg total) by mouth at bedtime.  Dispense: 30 tablet; Refill: 11  4. History of tobacco abuse Patient has been advised of the health risks of smoking and counseled concerning cessation of tobacco products. I spent over 3 minutes for discussion and to answer questions.

## 2021-08-10 ENCOUNTER — Other Ambulatory Visit: Payer: Self-pay

## 2021-08-10 DIAGNOSIS — R7989 Other specified abnormal findings of blood chemistry: Secondary | ICD-10-CM

## 2021-08-10 LAB — COMPREHENSIVE METABOLIC PANEL
ALT: 20 IU/L (ref 0–44)
AST: 21 IU/L (ref 0–40)
Albumin/Globulin Ratio: 1.3 (ref 1.2–2.2)
Albumin: 4.3 g/dL (ref 3.7–4.7)
Alkaline Phosphatase: 114 IU/L (ref 44–121)
BUN/Creatinine Ratio: 16 (ref 10–24)
BUN: 32 mg/dL — ABNORMAL HIGH (ref 8–27)
Bilirubin Total: 0.3 mg/dL (ref 0.0–1.2)
CO2: 19 mmol/L — ABNORMAL LOW (ref 20–29)
Calcium: 9.6 mg/dL (ref 8.6–10.2)
Chloride: 96 mmol/L (ref 96–106)
Creatinine, Ser: 1.96 mg/dL — ABNORMAL HIGH (ref 0.76–1.27)
Globulin, Total: 3.2 g/dL (ref 1.5–4.5)
Glucose: 95 mg/dL (ref 70–99)
Potassium: 5.5 mmol/L — ABNORMAL HIGH (ref 3.5–5.2)
Sodium: 131 mmol/L — ABNORMAL LOW (ref 134–144)
Total Protein: 7.5 g/dL (ref 6.0–8.5)
eGFR: 35 mL/min/{1.73_m2} — ABNORMAL LOW (ref >59)

## 2021-08-10 LAB — HEMOGLOBIN A1C
Est. average glucose Bld gHb Est-mCnc: 120 mg/dL
Hgb A1c MFr Bld: 5.8 % — ABNORMAL HIGH (ref 4.8–5.6)

## 2021-08-10 NOTE — Telephone Encounter (Signed)
Pt called back to provide his sisters phone number: [phone]

## 2021-08-18 ENCOUNTER — Other Ambulatory Visit: Payer: Self-pay | Admitting: Nephrology

## 2021-08-18 DIAGNOSIS — N1832 Chronic kidney disease, stage 3b: Secondary | ICD-10-CM | POA: Diagnosis not present

## 2021-08-18 DIAGNOSIS — I10 Essential (primary) hypertension: Secondary | ICD-10-CM | POA: Diagnosis not present

## 2021-08-28 DIAGNOSIS — R531 Weakness: Secondary | ICD-10-CM | POA: Diagnosis not present

## 2021-08-28 DIAGNOSIS — R5081 Fever presenting with conditions classified elsewhere: Secondary | ICD-10-CM | POA: Diagnosis not present

## 2021-08-28 DIAGNOSIS — E1122 Type 2 diabetes mellitus with diabetic chronic kidney disease: Secondary | ICD-10-CM | POA: Diagnosis not present

## 2021-08-28 DIAGNOSIS — I130 Hypertensive heart and chronic kidney disease with heart failure and stage 1 through stage 4 chronic kidney disease, or unspecified chronic kidney disease: Secondary | ICD-10-CM | POA: Diagnosis not present

## 2021-08-28 DIAGNOSIS — E875 Hyperkalemia: Secondary | ICD-10-CM | POA: Diagnosis not present

## 2021-08-28 DIAGNOSIS — E1140 Type 2 diabetes mellitus with diabetic neuropathy, unspecified: Secondary | ICD-10-CM | POA: Diagnosis not present

## 2021-08-28 DIAGNOSIS — E278 Other specified disorders of adrenal gland: Secondary | ICD-10-CM | POA: Diagnosis not present

## 2021-08-28 DIAGNOSIS — N1832 Chronic kidney disease, stage 3b: Secondary | ICD-10-CM | POA: Diagnosis not present

## 2021-08-28 DIAGNOSIS — R112 Nausea with vomiting, unspecified: Secondary | ICD-10-CM | POA: Diagnosis not present

## 2021-08-28 DIAGNOSIS — I5032 Chronic diastolic (congestive) heart failure: Secondary | ICD-10-CM | POA: Diagnosis not present

## 2021-08-28 DIAGNOSIS — J449 Chronic obstructive pulmonary disease, unspecified: Secondary | ICD-10-CM | POA: Diagnosis not present

## 2021-08-28 DIAGNOSIS — R109 Unspecified abdominal pain: Secondary | ICD-10-CM | POA: Diagnosis not present

## 2021-08-28 DIAGNOSIS — N179 Acute kidney failure, unspecified: Secondary | ICD-10-CM | POA: Diagnosis not present

## 2021-08-28 DIAGNOSIS — E871 Hypo-osmolality and hyponatremia: Secondary | ICD-10-CM | POA: Diagnosis not present

## 2021-08-28 DIAGNOSIS — M069 Rheumatoid arthritis, unspecified: Secondary | ICD-10-CM | POA: Diagnosis not present

## 2021-08-29 DIAGNOSIS — N189 Chronic kidney disease, unspecified: Secondary | ICD-10-CM | POA: Diagnosis not present

## 2021-08-29 DIAGNOSIS — R112 Nausea with vomiting, unspecified: Secondary | ICD-10-CM | POA: Diagnosis not present

## 2021-08-29 DIAGNOSIS — N179 Acute kidney failure, unspecified: Secondary | ICD-10-CM | POA: Diagnosis not present

## 2021-08-29 DIAGNOSIS — E871 Hypo-osmolality and hyponatremia: Secondary | ICD-10-CM | POA: Diagnosis not present

## 2021-08-30 DIAGNOSIS — J449 Chronic obstructive pulmonary disease, unspecified: Secondary | ICD-10-CM | POA: Diagnosis not present

## 2021-08-30 DIAGNOSIS — R112 Nausea with vomiting, unspecified: Secondary | ICD-10-CM | POA: Diagnosis not present

## 2021-08-30 DIAGNOSIS — N179 Acute kidney failure, unspecified: Secondary | ICD-10-CM | POA: Diagnosis not present

## 2021-09-01 ENCOUNTER — Ambulatory Visit: Payer: Medicaid Other

## 2021-09-10 ENCOUNTER — Ambulatory Visit: Payer: Self-pay

## 2021-09-10 ENCOUNTER — Ambulatory Visit: Payer: Self-pay | Admitting: *Deleted

## 2021-09-10 DIAGNOSIS — Z720 Tobacco use: Secondary | ICD-10-CM | POA: Diagnosis not present

## 2021-09-10 DIAGNOSIS — J449 Chronic obstructive pulmonary disease, unspecified: Secondary | ICD-10-CM | POA: Diagnosis not present

## 2021-09-10 DIAGNOSIS — F17210 Nicotine dependence, cigarettes, uncomplicated: Secondary | ICD-10-CM | POA: Diagnosis not present

## 2021-09-10 NOTE — Telephone Encounter (Signed)
2nd attempt to contact patient to review rash. No answer , LVMTCB [phone].

## 2021-09-10 NOTE — Telephone Encounter (Signed)
Reason for Disposition  SEVERE itching (i.e., interferes with sleep, normal activities or school)  Answer Assessment - Initial Assessment Questions 1. APPEARANCE of RASH: "Describe the rash." (e.g., spots, blisters, raised areas, skin peeling, scaly)     My skin is bad.   The area between my legs are red and is so sore I can't touch it.   My skin is dying.    It's getting worse and worse.   I was given medicine cream for the rash but it's not working.     I'm itching so bad.   Between my thighs is so sore and red.    2. SIZE: "How big are the spots?" (e.g., tip of pen, eraser, coin; inches, centimeters)     The rash is from my toes and to my head. 3. LOCATION: "Where is the rash located?"     All over my body.   4. COLOR: "What color is the rash?" (Note: It is difficult to assess rash color in people with darker-colored skin. When this situation occurs, simply ask the caller to describe what they see.)     It's red and "dying" my skin 5. ONSET: "When did the rash begin?"     This has been going on and long time. 6. FEVER: "Do you have a fever?" If Yes, ask: "What is your temperature, how was it measured, and when did it start?"     No 7. ITCHING: "Does the rash itch?" If Yes, ask: "How bad is the itch?" (Scale 1-10; or mild, moderate, severe)     Yes 8. CAUSE: "What do you think is causing the rash?"     I don't know 9. MEDICINE FACTORS: "Have you started any new medicines within the last 2 weeks?" (e.g., antibiotics)      Yes the doctor gave me cream but it's not working. 10. OTHER SYMPTOMS: "Do you have any other symptoms?" (e.g., dizziness, headache, sore throat, joint pain)       Itching and painful between my thighs and my back itches so bad. 11. PREGNANCY: "Is there any chance you are pregnant?" "When was your last menstrual period?"       N/A  Protocols used: Rash or Redness - [facility]

## 2021-09-10 NOTE — Telephone Encounter (Signed)
  Chief Complaint: itchy rash all over body but really sore between legs skin is weeping.   Cream from dermatologist has not helped.   "My skin is dying" Symptoms: Severe itching all over.   Skin is peeling off and weeping.  Very red and raw between his legs. Frequency: For several weeks Pertinent Negatives: Patient denies N/A Disposition: '[]'$ ED /'[]'$ Urgent Care (no appt availability in office) / '[x]'$ Appointment(In office/virtual)/ '[]'$  [facility]/ '[]'$ Home Care/ '[]'$ Refused Recommended Disposition /'[]'$ [facility]/ '[]'$  Follow-up with PCP Additional Notes: Appt made for 09/13/2021 at 10:20 with Dr. Ronnald Ramp at [facility] over care advice.   He had not tried an Aveeno oatmeal bath so he is going to try that.

## 2021-09-10 NOTE — Telephone Encounter (Signed)
Pt called saying he still has a bad rash that Dr. Ronnald Ramp sent him to a dermatologist .  He said he went and they gave a cream but it is not working.  The rash is all over his body.   Left message to call back about symptoms.

## 2021-09-10 NOTE — Telephone Encounter (Signed)
Third attempt to reach pt. Left message to call back. 

## 2021-09-12 DIAGNOSIS — L299 Pruritus, unspecified: Secondary | ICD-10-CM | POA: Diagnosis not present

## 2021-09-12 DIAGNOSIS — I509 Heart failure, unspecified: Secondary | ICD-10-CM | POA: Diagnosis not present

## 2021-09-12 DIAGNOSIS — R112 Nausea with vomiting, unspecified: Secondary | ICD-10-CM | POA: Diagnosis not present

## 2021-09-12 DIAGNOSIS — E1122 Type 2 diabetes mellitus with diabetic chronic kidney disease: Secondary | ICD-10-CM | POA: Diagnosis not present

## 2021-09-12 DIAGNOSIS — N179 Acute kidney failure, unspecified: Secondary | ICD-10-CM | POA: Diagnosis not present

## 2021-09-12 DIAGNOSIS — R0602 Shortness of breath: Secondary | ICD-10-CM | POA: Diagnosis not present

## 2021-09-12 DIAGNOSIS — N183 Chronic kidney disease, stage 3 unspecified: Secondary | ICD-10-CM | POA: Diagnosis not present

## 2021-09-12 DIAGNOSIS — R2681 Unsteadiness on feet: Secondary | ICD-10-CM | POA: Diagnosis not present

## 2021-09-12 DIAGNOSIS — E11620 Type 2 diabetes mellitus with diabetic dermatitis: Secondary | ICD-10-CM | POA: Diagnosis not present

## 2021-09-12 DIAGNOSIS — R531 Weakness: Secondary | ICD-10-CM | POA: Diagnosis not present

## 2021-09-12 DIAGNOSIS — R21 Rash and other nonspecific skin eruption: Secondary | ICD-10-CM | POA: Diagnosis not present

## 2021-09-12 DIAGNOSIS — M069 Rheumatoid arthritis, unspecified: Secondary | ICD-10-CM | POA: Diagnosis not present

## 2021-09-12 DIAGNOSIS — E875 Hyperkalemia: Secondary | ICD-10-CM | POA: Diagnosis not present

## 2021-09-12 DIAGNOSIS — J9811 Atelectasis: Secondary | ICD-10-CM | POA: Diagnosis not present

## 2021-09-12 DIAGNOSIS — J449 Chronic obstructive pulmonary disease, unspecified: Secondary | ICD-10-CM | POA: Diagnosis not present

## 2021-09-12 DIAGNOSIS — R52 Pain, unspecified: Secondary | ICD-10-CM | POA: Diagnosis not present

## 2021-09-12 DIAGNOSIS — I130 Hypertensive heart and chronic kidney disease with heart failure and stage 1 through stage 4 chronic kidney disease, or unspecified chronic kidney disease: Secondary | ICD-10-CM | POA: Diagnosis not present

## 2021-09-13 ENCOUNTER — Telehealth: Payer: Self-pay | Admitting: Family Medicine

## 2021-09-13 ENCOUNTER — Ambulatory Visit: Payer: Medicare Other | Admitting: Family Medicine

## 2021-09-13 DIAGNOSIS — N189 Chronic kidney disease, unspecified: Secondary | ICD-10-CM | POA: Diagnosis not present

## 2021-09-13 DIAGNOSIS — N179 Acute kidney failure, unspecified: Secondary | ICD-10-CM | POA: Diagnosis not present

## 2021-09-13 DIAGNOSIS — E875 Hyperkalemia: Secondary | ICD-10-CM | POA: Diagnosis not present

## 2021-09-13 DIAGNOSIS — R112 Nausea with vomiting, unspecified: Secondary | ICD-10-CM | POA: Diagnosis not present

## 2021-09-13 DIAGNOSIS — R21 Rash and other nonspecific skin eruption: Secondary | ICD-10-CM | POA: Diagnosis not present

## 2021-09-13 DIAGNOSIS — F17210 Nicotine dependence, cigarettes, uncomplicated: Secondary | ICD-10-CM | POA: Diagnosis not present

## 2021-09-13 NOTE — Telephone Encounter (Signed)
Copied from [place]. Topic: Appointment Scheduling - Scheduling Inquiry for Clinic >> Sep 13, 2021 10:54 AM Erskine Squibb wrote: Reason for CRM: The patient sister, Daniel Frye called in to let us know that the patient was admitted yesterday 07/09 to [facility] in [place] and that is why he didn't show up to his appointment this morning.

## 2021-09-14 DIAGNOSIS — N179 Acute kidney failure, unspecified: Secondary | ICD-10-CM | POA: Diagnosis not present

## 2021-09-14 DIAGNOSIS — E875 Hyperkalemia: Secondary | ICD-10-CM | POA: Diagnosis not present

## 2021-09-14 DIAGNOSIS — N183 Chronic kidney disease, stage 3 unspecified: Secondary | ICD-10-CM | POA: Diagnosis not present

## 2021-09-14 DIAGNOSIS — R21 Rash and other nonspecific skin eruption: Secondary | ICD-10-CM | POA: Diagnosis not present

## 2021-09-16 DIAGNOSIS — N184 Chronic kidney disease, stage 4 (severe): Secondary | ICD-10-CM | POA: Diagnosis not present

## 2021-09-16 DIAGNOSIS — E875 Hyperkalemia: Secondary | ICD-10-CM | POA: Diagnosis not present

## 2021-09-16 DIAGNOSIS — N2581 Secondary hyperparathyroidism of renal origin: Secondary | ICD-10-CM | POA: Diagnosis not present

## 2021-09-16 DIAGNOSIS — I10 Essential (primary) hypertension: Secondary | ICD-10-CM | POA: Diagnosis not present

## 2021-09-17 ENCOUNTER — Telehealth: Payer: Self-pay

## 2021-09-17 ENCOUNTER — Telehealth: Payer: Self-pay | Admitting: Family Medicine

## 2021-09-17 ENCOUNTER — Other Ambulatory Visit: Payer: Self-pay

## 2021-09-17 DIAGNOSIS — F17210 Nicotine dependence, cigarettes, uncomplicated: Secondary | ICD-10-CM | POA: Diagnosis not present

## 2021-09-17 DIAGNOSIS — G4733 Obstructive sleep apnea (adult) (pediatric): Secondary | ICD-10-CM | POA: Diagnosis not present

## 2021-09-17 DIAGNOSIS — J449 Chronic obstructive pulmonary disease, unspecified: Secondary | ICD-10-CM | POA: Diagnosis not present

## 2021-09-17 DIAGNOSIS — I130 Hypertensive heart and chronic kidney disease with heart failure and stage 1 through stage 4 chronic kidney disease, or unspecified chronic kidney disease: Secondary | ICD-10-CM | POA: Diagnosis not present

## 2021-09-17 DIAGNOSIS — N183 Chronic kidney disease, stage 3 unspecified: Secondary | ICD-10-CM | POA: Diagnosis not present

## 2021-09-17 DIAGNOSIS — I509 Heart failure, unspecified: Secondary | ICD-10-CM | POA: Diagnosis not present

## 2021-09-17 DIAGNOSIS — R21 Rash and other nonspecific skin eruption: Secondary | ICD-10-CM | POA: Diagnosis not present

## 2021-09-17 DIAGNOSIS — E1122 Type 2 diabetes mellitus with diabetic chronic kidney disease: Secondary | ICD-10-CM | POA: Diagnosis not present

## 2021-09-17 DIAGNOSIS — N179 Acute kidney failure, unspecified: Secondary | ICD-10-CM | POA: Diagnosis not present

## 2021-09-17 DIAGNOSIS — M069 Rheumatoid arthritis, unspecified: Secondary | ICD-10-CM | POA: Diagnosis not present

## 2021-09-17 NOTE — Telephone Encounter (Signed)
Santiago Glad from [facility] called and wanted verbal for [facility] care for pt. I gave her the "okay" as well as put in a referral for pt to get back in with derm. He was supposed to follow up with Dr Phillip Heal in 6 weeks s/p his Feb appt with her. At this time, if rash was not better, they would discuss going on Dupixent. I notified Lexine Baton that pt may have to be called twice to answer the phone. Call back for Santiago Glad [phone]

## 2021-09-17 NOTE — Telephone Encounter (Addendum)
Home Health Verbal Orders - Caller/Agency: Nurse [facility] Number: [phone] Requesting OT/PT/Skilled Nursing/: Frequency: 1 wk for 4 wks, 2 a month 1, 2 prn visits as needed  Patient was discharged from [facility] on 09/14/2021

## 2021-09-22 DIAGNOSIS — L2089 Other atopic dermatitis: Secondary | ICD-10-CM | POA: Diagnosis not present

## 2021-09-22 DIAGNOSIS — Z79899 Other long term (current) drug therapy: Secondary | ICD-10-CM | POA: Diagnosis not present

## 2021-09-27 ENCOUNTER — Encounter: Payer: Self-pay | Admitting: Family Medicine

## 2021-09-27 ENCOUNTER — Other Ambulatory Visit: Payer: Self-pay | Admitting: Family Medicine

## 2021-09-27 ENCOUNTER — Other Ambulatory Visit: Payer: Self-pay

## 2021-09-27 ENCOUNTER — Ambulatory Visit (INDEPENDENT_AMBULATORY_CARE_PROVIDER_SITE_OTHER): Payer: Medicare Other | Admitting: Family Medicine

## 2021-09-27 VITALS — BP 100/80 | HR 80 | Ht 68.5 in | Wt 175.0 lb

## 2021-09-27 DIAGNOSIS — I10 Essential (primary) hypertension: Secondary | ICD-10-CM | POA: Diagnosis not present

## 2021-09-27 DIAGNOSIS — R1111 Vomiting without nausea: Secondary | ICD-10-CM

## 2021-09-27 DIAGNOSIS — R634 Abnormal weight loss: Secondary | ICD-10-CM | POA: Diagnosis not present

## 2021-09-27 MED ORDER — HYDROCHLOROTHIAZIDE 12.5 MG PO TABS: 12.5000 mg | ORAL_TABLET | Freq: Every day | ORAL | 1 refills | Status: DC

## 2021-09-27 NOTE — Progress Notes (Signed)
Date:  09/27/2021   Name:  Daniel Frye   DOB:  10-Oct-1950   MRN:  [id number]   Chief Complaint: Follow-up (Rash on hands and feet- following up with dermatology. Started Dupixent for itching)  Hypertension This is a chronic problem. The current episode started more than 1 year ago. The problem has been gradually improving since onset. The problem is controlled. Pertinent negatives include no chest pain, headaches, neck pain, orthopnea, palpitations, PND or shortness of breath. Past treatments include ACE inhibitors and diuretics. The current treatment provides mild improvement. There are no compliance problems.  There is no history of angina, kidney disease, CAD/MI, CVA, heart failure, left ventricular hypertrophy, PVD or retinopathy. There is no history of chronic renal disease, a hypertension causing med or renovascular disease.  Emesis  This is a new problem. The current episode started 1 to 4 weeks ago (3 weeks). The problem has been waxing and waning. The emesis has an appearance of stomach contents. Pertinent negatives include no abdominal pain, chest pain, chills, coughing, diarrhea, dizziness, fever, headaches or myalgias.    Lab Results  Component Value Date   NA 131 (L) 08/09/2021   K 5.5 (H) 08/09/2021   CO2 19 (L) 08/09/2021   GLUCOSE 95 08/09/2021   BUN 32 (H) 08/09/2021   CREATININE 1.96 (H) 08/09/2021   CALCIUM 9.6 08/09/2021   EGFR 35 (L) 08/09/2021   GFRNONAA >60 10/09/2019   No results found for: "CHOL", "HDL", "LDLCALC", "LDLDIRECT", "TRIG", "CHOLHDL" Lab Results  Component Value Date   TSH 0.476 08/25/2020   Lab Results  Component Value Date   HGBA1C 5.8 (H) 08/09/2021   Lab Results  Component Value Date   WBC 3.4 08/25/2020   HGB 12.6 (L) 08/25/2020   HCT 37.3 (L) 08/25/2020   MCV 96 08/25/2020   PLT 73 (LL) 08/25/2020   Lab Results  Component Value Date   ALT 20 08/09/2021   AST 21 08/09/2021   ALKPHOS 114 08/09/2021   BILITOT 0.3 08/09/2021    Lab Results  Component Value Date   VD25OH 11.1 (L) 01/26/2015     Review of Systems  Constitutional:  Positive for unexpected weight change. Negative for chills and fever.  HENT:  Negative for drooling, ear discharge, ear pain and sore throat.   Respiratory:  Negative for cough, shortness of breath and wheezing.   Cardiovascular:  Negative for chest pain, palpitations, orthopnea, leg swelling and PND.  Gastrointestinal:  Positive for vomiting. Negative for abdominal pain, blood in stool, constipation, diarrhea and nausea.  Endocrine: Negative for polydipsia.  Genitourinary:  Negative for dysuria, frequency, hematuria and urgency.  Musculoskeletal:  Negative for back pain, myalgias and neck pain.  Skin:  Negative for rash.  Allergic/Immunologic: Negative for environmental allergies.  Neurological:  Negative for dizziness and headaches.  Hematological:  Does not bruise/bleed easily.  Psychiatric/Behavioral:  Negative for suicidal ideas. The patient is not nervous/anxious.     Patient Active Problem List   Diagnosis Date Noted   Polyp of transverse colon    Chronic pain syndrome 01/14/2020   Pharmacologic therapy 01/14/2020   Disorder of skeletal system 01/14/2020   Problems influencing health status 01/14/2020   History of substance use disorder 01/14/2020   Abnormal drug screens 01/14/2020   History of alcohol abuse 01/14/2020   History of cocaine use 01/14/2020   History of marijuana use 01/14/2020   History of tobacco abuse 01/14/2020   Acute anaphylaxis 07/31/2019   Acute respiratory failure with  hypoxia ([place]) 07/30/2019   DDD (degenerative disc disease), lumbar 01/07/2019   Cervical spondylosis with myelopathy 08/01/2018   Neck pain 05/30/2018   Degenerative cervical spinal stenosis 05/29/2018   Aortic atherosclerosis ([place]) 05/15/2018   Depression 04/01/2018   Numbness and tingling in both hands 03/13/2018   Encounter for long-term (current) use of high-risk  medication 08/09/2017   Primary osteoarthritis of both knees 08/09/2017   Hypokalemia 03/03/2016   Hypomagnesemia 03/03/2016   Substance induced mood disorder ([place]) 03/02/2016   Alcohol withdrawal ([place]) 02/29/2016   Crack cocaine use 01/22/2016   Obstructive sleep apnea of adult 03/30/2015   Edema of foot 03/30/2015   Chronic diastolic CHF (congestive heart failure), NYHA class 2 ([place]) 03/30/2015   Apnea, sleep 03/30/2015   Alcohol abuse 03/23/2015   Noncompliance 03/23/2015   Vitamin D deficiency 01/27/2015   Diabetes mellitus type 2, controlled, without complications ([place]) 79/39/0300   Obesity (BMI 30.0-34.9) 11/26/2014   COPD (chronic obstructive pulmonary disease) ([place]) 06/22/2014   Essential (primary) hypertension 06/22/2014   Rheumatoid arthritis involving multiple sites ([place]) 06/22/2014   Tobacco abuse 06/22/2014   Chronic obstructive pulmonary disease with acute exacerbation ([place]) 06/22/2014   History of drug abuse ([place]) 03/05/2009   SOB (shortness of breath) on exertion 03/05/2009   Alcohol abuse, daily use 03/05/2009   Cocaine dependence, abuse ([place]) 03/05/2009   Fever 03/05/2009   Wheezing 03/05/2009    Allergies  Allergen Reactions   Renflexis [Infliximab] Anaphylaxis    Past Surgical History:  Procedure Laterality Date   BACK SURGERY     COLONOSCOPY WITH PROPOFOL N/A 12/31/2020   Procedure: COLONOSCOPY WITH PROPOFOL;  Surgeon: [name], MD;  Location: [facility] ENDOSCOPY;  Service: Endoscopy;  Laterality: N/A;   heart surgery to drain fluid N/A 03/07/1985   Heart surgery to remove fluid     Social History   Tobacco Use   Smoking status: Every Day    Packs/day: 0.25    Years: 51.00    Total pack years: 12.75    Types: Cigarettes, Cigars   Smokeless tobacco: Never   Tobacco comments:    Wants to quit no quit date set  Vaping Use   Vaping Use: Never used  Substance Use Topics   Alcohol use: Yes    Alcohol/week: 61.0 standard drinks of alcohol     Types: 12 Cans of beer, 49 Standard drinks or equivalent per week    Comment: Drinks 1/5 day of wine    Drug use: Not Currently    Comment: no drugs in 1 year and 3 months     Medication list has been reviewed and updated.  Current Meds  Medication Sig   Adalimumab (HUMIRA PEN) 40 MG/0.4ML PNKT Inject into the skin.   albuterol (PROAIR HFA) 108 (90 Base) MCG/ACT inhaler INHALE 2 PUFFS BY MOUTH EVERY 4 HOURS AS NEEDED FOR  WHEEZING  OR  SHORTNESS  OF  BREATH   albuterol (PROVENTIL) (2.5 MG/3ML) 0.083% nebulizer solution Take 3 mLs (2.5 mg total) by nebulization every 6 (six) hours as needed for wheezing or shortness of breath.   DUPIXENT 300 MG/2ML SOPN Inject into the skin.   folic acid (FOLVITE) 1 MG tablet Take 1 mg by mouth daily.   lisinopril-hydrochlorothiazide (ZESTORETIC) 10-12.5 MG tablet Take 1 tablet by mouth daily.   permethrin (ELIMITE) 5 % cream Apply to all areas of the body from the neck to soles of feet, also apply on the hairline, neck, scalp, temple, and forehead; leave on  for 8 to 14 hours before removing by washing (shower or bath)   thiamine (VITAMIN B-1) 100 MG tablet Take by mouth.   TRELEGY ELLIPTA 100-62.5-25 MCG/INH AEPB SMARTSIG:1 Via Inhaler Daily PRN   triamcinolone ointment (KENALOG) 0.1 % Apply topically.       09/27/2021    4:07 PM 08/09/2021   11:17 AM 11/22/2020   11:19 AM 08/25/2020   11:18 AM  GAD 7 : Generalized Anxiety Score  Nervous, Anxious, on Edge 1 0 0 0  Control/stop worrying 1 0 0 0  Worry too much - different things 1 0 0 0  Trouble relaxing 0 0 0 0  Restless 0 0 0 0  Easily annoyed or irritable 0 0 0 0  Afraid - awful might happen 0 2 0 1  Total GAD 7 Score 3 2 0 1  Anxiety Difficulty Extremely difficult Somewhat difficult Not difficult at all Somewhat difficult       09/27/2021    4:07 PM 08/09/2021   11:16 AM 01/07/2021   10:10 AM  Depression screen PHQ 2/9  Decreased Interest 3 0 1  Down, Depressed, Hopeless _0 PHQ - 2  Score _1 Altered sleeping 0 0 0  Tired, decreased energy 1 0 3  Change in appetite 2 3 0  Feeling bad or failure about yourself  1 0 0  Trouble concentrating 1 0 1  Moving slowly or fidgety/restless 1 0 1  Suicidal thoughts 1 0 0  PHQ-9 Score _2 Difficult doing work/chores Very difficult Somewhat difficult Somewhat difficult    BP Readings from Last 3 Encounters:  09/27/21 100/80  08/09/21 120/80  03/30/21 (!) 144/100    Physical Exam Vitals and nursing note reviewed.  HENT:     Head: Normocephalic.     Right Ear: Tympanic membrane and external ear normal.     Left Ear: Tympanic membrane and external ear normal.     Nose: Nose normal. No congestion or rhinorrhea.  Eyes:     General: No scleral icterus.       Right eye: No discharge.        Left eye: No discharge.     Conjunctiva/sclera: Conjunctivae normal.     Pupils: Pupils are equal, round, and reactive to light.  Neck:     Thyroid: No thyromegaly.     Vascular: No JVD.     Trachea: No tracheal deviation.  Cardiovascular:     Rate and Rhythm: Normal rate and regular rhythm.     Heart sounds: Normal heart sounds. No murmur heard.    No friction rub. No gallop.  Pulmonary:     Effort: No respiratory distress.     Breath sounds: Normal breath sounds. No wheezing or rales.  Abdominal:     General: Bowel sounds are normal.     Palpations: Abdomen is soft. There is no mass.     Tenderness: There is no abdominal tenderness. There is no guarding or rebound.  Musculoskeletal:        General: No tenderness. Normal range of motion.     Cervical back: Normal range of motion and neck supple.  Lymphadenopathy:     Cervical: No cervical adenopathy.  Skin:    General: Skin is warm.     Findings: No rash.  Neurological:     Mental Status: He is alert and oriented to person, place, and time.     Cranial Nerves: No cranial nerve  deficit.     Deep Tendon Reflexes: Reflexes are normal and symmetric.     Wt Readings  from Last 3 Encounters:  09/27/21 175 lb (79.4 kg)  08/09/21 186 lb (84.4 kg)  03/30/21 187 lb (84.8 kg)    BP 100/80   Pulse 80   Ht 5' 8.5" (1.74 m)   Wt 175 lb (79.4 kg)   BMI 26.22 kg/m   Assessment and Plan:  1. Vomiting without nausea, unspecified vomiting type New onset.  For the past several weeks patient initially had inability to swallow with dysphagia but now he says that food gets to his stomach.  Since that hits his stomach it comes right back up.  I tend to think this since this is primarily involving meats and not vegetables and fruits but this is probably a swallowing problem however patient is insistent that that its getting down into his stomach and and he has to be prepared for to come back up shortly after swallowing.  We will refer to gastroenterology for evaluation of possible dysphagia and as noted below there is documented weight loss. - Ambulatory referral to Gastroenterology  2. Weight loss Unintentional weight loss probably secondary to the above whether is due to inability to eat.  Patient is a very poor historian and has recently been put on [place].  I do not think the GI problem is related to this but this becomes a diagnosis of exclusion if we can rule out an issue with either his esophagus or gastric outlet obstruction. - Ambulatory referral to Gastroenterology  3. Primary hypertension Chronic.  Controlled.  Stable.  This is also an issue and that patient was seen in one of the 2 medications [place] lisinopril hydrochlorothiazide was stopped and now he only takes a "little blue pill ".  I told him that we need to find out what this is with called his pharmacy and it looks like he has not had his lisinopril hydrochlorothiazide refilled in at least a month but has had hydrochlorothiazide at 12.5 mg once a day.  We will continue just hydrochlorothiazide 12.5 mg once a day and will recheck in 3 to 4 months.

## 2021-10-01 DIAGNOSIS — J449 Chronic obstructive pulmonary disease, unspecified: Secondary | ICD-10-CM | POA: Diagnosis not present

## 2021-10-01 DIAGNOSIS — F17210 Nicotine dependence, cigarettes, uncomplicated: Secondary | ICD-10-CM | POA: Diagnosis not present

## 2021-10-04 ENCOUNTER — Telehealth: Payer: Self-pay | Admitting: Family Medicine

## 2021-10-04 NOTE — Telephone Encounter (Signed)
Copied from [place] [id number]. Topic: Quick Communication - Home Health Verbal Orders >> Oct 04, 2021  4:51 PM Everette C wrote: Caller/Agency: [facility] Number: [phone] Requesting OT/PT/Skilled Nursing/Social Work/Speech Therapy: PT Frequency: 1w4 and 1x every 2 weeks for 4 weeks

## 2021-10-05 ENCOUNTER — Telehealth: Payer: Self-pay

## 2021-10-05 NOTE — Telephone Encounter (Signed)
Called [facility]/ Shalini [phone]. Proceed with OT/PT 1x4, 1x2, and 1x4

## 2021-10-19 DIAGNOSIS — E1122 Type 2 diabetes mellitus with diabetic chronic kidney disease: Secondary | ICD-10-CM | POA: Diagnosis not present

## 2021-10-19 DIAGNOSIS — M069 Rheumatoid arthritis, unspecified: Secondary | ICD-10-CM | POA: Diagnosis not present

## 2021-10-19 DIAGNOSIS — R21 Rash and other nonspecific skin eruption: Secondary | ICD-10-CM | POA: Diagnosis not present

## 2021-10-19 DIAGNOSIS — F17210 Nicotine dependence, cigarettes, uncomplicated: Secondary | ICD-10-CM | POA: Diagnosis not present

## 2021-10-19 DIAGNOSIS — J449 Chronic obstructive pulmonary disease, unspecified: Secondary | ICD-10-CM | POA: Diagnosis not present

## 2021-10-19 DIAGNOSIS — I509 Heart failure, unspecified: Secondary | ICD-10-CM | POA: Diagnosis not present

## 2021-10-19 DIAGNOSIS — N179 Acute kidney failure, unspecified: Secondary | ICD-10-CM | POA: Diagnosis not present

## 2021-10-19 DIAGNOSIS — I130 Hypertensive heart and chronic kidney disease with heart failure and stage 1 through stage 4 chronic kidney disease, or unspecified chronic kidney disease: Secondary | ICD-10-CM | POA: Diagnosis not present

## 2021-10-19 DIAGNOSIS — G4733 Obstructive sleep apnea (adult) (pediatric): Secondary | ICD-10-CM | POA: Diagnosis not present

## 2021-10-19 DIAGNOSIS — N183 Chronic kidney disease, stage 3 unspecified: Secondary | ICD-10-CM | POA: Diagnosis not present

## 2021-10-22 ENCOUNTER — Other Ambulatory Visit: Payer: Self-pay | Admitting: Family Medicine

## 2021-10-22 DIAGNOSIS — G629 Polyneuropathy, unspecified: Secondary | ICD-10-CM

## 2021-10-22 MED ORDER — GABAPENTIN 100 MG PO CAPS: 100.0000 mg | ORAL_CAPSULE | Freq: Two times a day (BID) | ORAL | 3 refills | Status: DC

## 2021-10-22 NOTE — Telephone Encounter (Signed)
Spoke with [facility] who states pt only has two meds, Pantoprazole and HCTZ. She is requesting Remeron and Gabapentin. Told HHRN there is a good rx at pharmacy for Remeron but the Gabapentin has expired.Will request Gabapentin and HHRN is going to call to request a refill on Remeron.

## 2021-10-22 NOTE — Telephone Encounter (Signed)
Requested Prescriptions  Pending Prescriptions Disp Refills  . gabapentin (NEURONTIN) 100 MG capsule 90 capsule 3    Sig: Take 1 capsule (100 mg total) by mouth 2 (two) times daily.     Neurology: Anticonvulsants - gabapentin Failed - 10/22/2021  3:59 PM      Failed - Cr in normal range and within 360 days    Creatinine, Ser  Date Value Ref Range Status  08/09/2021 1.96 (H) 0.76 - 1.27 mg/dL Final         Passed - Completed PHQ-2 or PHQ-9 in the last 360 days      Passed - Valid encounter within last 12 months    Recent Outpatient Visits          3 weeks ago Vomiting without nausea, unspecified vomiting type   [facility] [place], Deanna C, MD   2 months ago Primary hypertension   [facility] [place], Deanna C, MD   6 months ago Primary hypertension   [facility] [place], Deanna C, MD   1 year ago Chronic fatigue   [facility] [place], Deanna C, MD   1 year ago Essential (primary) hypertension   [facility] [place], Yell, MD      Future Appointments            In 3 months Juline Patch, MD [facility]

## 2021-11-03 ENCOUNTER — Telehealth: Payer: Self-pay

## 2021-11-03 ENCOUNTER — Telehealth: Payer: Self-pay | Admitting: Family Medicine

## 2021-11-03 NOTE — Telephone Encounter (Signed)
Copied from [place] [id number]. Topic: Medical Record Request - Provider/Facility Request >> Nov 03, 2021 10:49 AM Ludger Nutting wrote: DJ with [facility] called to see if the medical record request he sent on 8/16 and 8/23 was received. Please advise. [phone]

## 2021-11-03 NOTE — Telephone Encounter (Signed)
Called pt after receiving phone call from DJ at [facility] they are wanting his records "to compare if all doctors have the correct medicine list." When I call the pt to ask him about this company, he doesn't know who they are or why they are calling. I asked that he have the [place] call me to see if she can tell me who they are. [phone] is DJ's callback

## 2021-11-12 ENCOUNTER — Ambulatory Visit: Payer: Medicare Other | Admitting: Family Medicine

## 2021-11-12 ENCOUNTER — Ambulatory Visit: Payer: Self-pay | Admitting: *Deleted

## 2021-11-12 NOTE — Telephone Encounter (Signed)
Summary: extreme itching   Renatta a nurse that works w/ pt called to make pt an urgent appt for itching.  She states he has been prescribed cream Kenalog for this issue, but it seems to be getting worse.  He may be having a reaction to he does not know what.  I was able to schedule pt 11:20 w/ Dr Ronnald Ramp.  She said if you had any questions you could call her at [phone].       Called Renatta to review sx of itching. No answer, [facility]. No contact with patient . Appt scheduled for today at 11:20 am.

## 2021-11-12 NOTE — Telephone Encounter (Signed)
Answer Assessment - Initial Assessment Questions 1. REASON FOR CALL or QUESTION: "What is your reason for calling today?" or "How can I best help you?" or "What question do you have that I can help answer?"     C/o itching worsening, called from [facility] to report itching and make appt for patient. No contact with Renatta or patient  Protocols used: Information Only Call - No Triage-A-AH

## 2021-11-16 ENCOUNTER — Telehealth: Payer: Self-pay | Admitting: Family Medicine

## 2021-11-16 NOTE — Telephone Encounter (Signed)
Spoke to [place] gave her verbal orders. She verbalized understanding.  KP

## 2021-11-16 NOTE — Telephone Encounter (Signed)
Home Health Verbal Orders - Caller/Agency: Shalini/ [facility]  Callback Number: [phone] vm can be left  Requesting PT Frequency: 1x every 2 weeks for the next 6 weeks to work on balance

## 2021-11-17 ENCOUNTER — Telehealth: Payer: Self-pay

## 2021-11-17 NOTE — Telephone Encounter (Signed)
I called Renatta/ nurse for patient- left vm stating pt is suppose to be seeing dermatology. If not better he needs to call them. I also provided the number to dermatology for her to give to patient again.

## 2021-11-19 DIAGNOSIS — G4733 Obstructive sleep apnea (adult) (pediatric): Secondary | ICD-10-CM | POA: Diagnosis not present

## 2021-11-19 DIAGNOSIS — N179 Acute kidney failure, unspecified: Secondary | ICD-10-CM | POA: Diagnosis not present

## 2021-11-19 DIAGNOSIS — N183 Chronic kidney disease, stage 3 unspecified: Secondary | ICD-10-CM | POA: Diagnosis not present

## 2021-11-19 DIAGNOSIS — J449 Chronic obstructive pulmonary disease, unspecified: Secondary | ICD-10-CM | POA: Diagnosis not present

## 2021-11-19 DIAGNOSIS — E1122 Type 2 diabetes mellitus with diabetic chronic kidney disease: Secondary | ICD-10-CM | POA: Diagnosis not present

## 2021-11-19 DIAGNOSIS — M069 Rheumatoid arthritis, unspecified: Secondary | ICD-10-CM | POA: Diagnosis not present

## 2021-11-19 DIAGNOSIS — I509 Heart failure, unspecified: Secondary | ICD-10-CM | POA: Diagnosis not present

## 2021-11-19 DIAGNOSIS — R21 Rash and other nonspecific skin eruption: Secondary | ICD-10-CM | POA: Diagnosis not present

## 2021-11-19 DIAGNOSIS — F17210 Nicotine dependence, cigarettes, uncomplicated: Secondary | ICD-10-CM | POA: Diagnosis not present

## 2021-11-19 DIAGNOSIS — I130 Hypertensive heart and chronic kidney disease with heart failure and stage 1 through stage 4 chronic kidney disease, or unspecified chronic kidney disease: Secondary | ICD-10-CM | POA: Diagnosis not present

## 2021-12-16 NOTE — Progress Notes (Signed)
I connected with  Daniel Frye on 12/17/2021 by a audio enabled telemedicine application and verified that I am speaking with the correct person using two identifiers.  Patient Location: Home  Provider Location: Office/Clinic  I discussed the limitations of evaluation and management by telemedicine. The patient expressed understanding and agreed to proceed.  Subjective:   Daniel Frye is a 71 y.o. male who presents for Medicare Annual/Subsequent preventive examination.  Review of Systems    Per HPI unless specifically indicated below.  Cardiac Risk Factors include: advanced age (>29mn, >>27women);male gender, essential hypertension, and diabetes mellitus type 2.         Objective:        09/27/2021    4:04 PM 08/09/2021   11:03 AM 03/30/2021   10:46 AM  Vitals with BMI  Height 5' 8.5" 5' 8.5" 5' 8.5"  Weight 175 lbs 186 lbs 187 lbs  BMI 26.22 [id number] Systolic [id number] Diastolic 80 80 1308 Pulse 80 80 100    There were no vitals filed for this visit. There is no height or weight on file to calculate BMI.     01/01/2021   10:20 AM 12/31/2020   10:20 AM 11/22/2020   10:53 AM 10/09/2019   11:41 AM 09/04/2019    1:28 PM 07/30/2019    8:31 PM 07/30/2019    3:26 PM  Advanced Directives  Does Patient Have a Medical Advance Directive? Yes No Yes No No;Yes No No  Type of AScientist, physiologicalof [place]Living will    Does patient want to make changes to medical advance directive? Yes (MAU/Ambulatory/Procedural Areas - Information given)  No - Patient declined      Copy of [place] Chart?   No - copy requested  No - copy requested    Would patient like information on creating a medical advance directive?      No - Patient declined     Current Medications (verified) Outpatient Encounter Medications as of 12/17/2021  Medication Sig   Adalimumab (HUMIRA PEN) 40 MG/0.4ML PNKT Inject into the skin.    albuterol (PROAIR HFA) 108 (90 Base) MCG/ACT inhaler INHALE 2 PUFFS BY MOUTH EVERY 4 HOURS AS NEEDED FOR  WHEEZING  OR  SHORTNESS  OF  BREATH   albuterol (PROVENTIL) (2.5 MG/3ML) 0.083% nebulizer solution Take 3 mLs (2.5 mg total) by nebulization every 6 (six) hours as needed for wheezing or shortness of breath.   Ensure Plus (ENSURE PLUS) LIQD Take 237 mLs by mouth.   hydrochlorothiazide (HYDRODIURIL) 12.5 MG tablet Take 1 tablet (12.5 mg total) by mouth daily.   thiamine (VITAMIN B-1) 100 MG tablet Take by mouth.   TRELEGY ELLIPTA 100-62.5-25 MCG/INH AEPB SMARTSIG:1 Via Inhaler Daily PRN   DUPIXENT 300 MG/2ML SOPN Inject into the skin. (Patient not taking: Reported on 165/78/4696   folic acid (FOLVITE) 1 MG tablet Take 1 mg by mouth daily. (Patient not taking: Reported on 12/17/2021)   gabapentin (NEURONTIN) 100 MG capsule Take 1 capsule (100 mg total) by mouth 2 (two) times daily. (Patient not taking: Reported on 12/17/2021)   methotrexate 2.5 MG tablet Take 8 tablets by mouth once a week. (Patient not taking: Reported on 12/17/2021)   mirtazapine (REMERON) 15 MG tablet Take 1 tablet (15 mg total) by mouth at bedtime. (Patient not taking: Reported on 09/27/2021)   permethrin (ELIMITE) 5 % cream Apply to all  areas of the body from the neck to soles of feet, also apply on the hairline, neck, scalp, temple, and forehead; leave on for 8 to 14 hours before removing by washing (shower or bath) (Patient not taking: Reported on 12/17/2021)   triamcinolone ointment (KENALOG) 0.1 % Apply topically. (Patient not taking: Reported on 12/17/2021)   No facility-administered encounter medications on file as of 12/17/2021.    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   Took shots for awhile but not now   Rheumatoid arthritis ([place])    Substance abuse ([place])    Tobacco use    Past Surgical History:  Procedure Laterality Date   BACK SURGERY     COLONOSCOPY WITH PROPOFOL N/A 12/31/2020   Procedure: COLONOSCOPY WITH PROPOFOL;  Surgeon: [name], MD;  Location: [facility] ENDOSCOPY;  Service: Endoscopy;  Laterality: N/A;   heart surgery to drain fluid N/A 03/07/1985   Heart surgery to remove fluid    Family History  Problem Relation Age of Onset   COPD Brother    Arthritis Brother    Diabetes Brother    Hypertension Brother    Social History   Socioeconomic History   Marital status: Single    Spouse name: Not on file   Number of children: 2   Years of education: 8   Highest education level: 9th grade  Occupational History   Occupation: Retired  Tobacco Use   Smoking status: Every Day    Packs/day: 0.25    Years: 51.00    Total pack years: 12.75    Types: Cigarettes, Cigars   Smokeless tobacco: Never   Tobacco comments:    Wants to quit no quit date set  Vaping Use   Vaping Use: Never used  Substance and Sexual Activity   Alcohol use: Yes    Alcohol/week: 49.0 standard drinks of alcohol    Types: 49 Standard drinks or equivalent per week    Comment: Drinks 1/5 day of wine    Drug use: Not Currently    Comment: no drugs in 1 year and 3 months   Sexual activity: Not Currently  Other Topics Concern   Not on file  Social History Narrative   One of his children passed away 8 months ago from 12-13-2020   Social Determinants of Health   Financial Resource Strain: Low Risk  (12/17/2021)   Overall Financial Resource Strain (CARDIA)    Difficulty of Paying Living Expenses: Not hard at all  Food Insecurity: No Food Insecurity (12/17/2021)   Hunger Vital Sign    Worried About Running Out of Food in the Last Year: Never true    Ran Out of Food in the Last Year: Never  true  Transportation Needs: No Transportation Needs (12/17/2021)   PRAPARE - Hydrologist (Medical): No    Lack of Transportation (Non-Medical): No  Physical Activity: Insufficiently Active (12/17/2021)   Exercise Vital Sign    Days of Exercise per Week: 2 days    Minutes of Exercise per Session: 10 min  Stress: No Stress Concern Present (12/17/2021)   [place] -  Occupational Stress Questionnaire    Feeling of Stress : Not at all  Social Connections: Moderately Integrated (12/17/2021)   Social Connection and Isolation Panel [NHANES]    Frequency of Communication with Friends and Family: More than three times a week    Frequency of Social Gatherings with Friends and Family: More than three times a week    Attends Religious Services: 1 to 4 times per year    Active Member of [employer] or Organizations: Yes    Attends Music therapist: More than 4 times per year    Marital Status: Never married    Tobacco Counseling Ready to quit: Yes Counseling given: Yes Tobacco comments: Wants to quit no quit date set   Clinical Intake:  Pre-visit preparation completed: No  Pain : No/denies pain     Nutritional Status: BMI of 19-24  Normal Nutritional Risks: Unintentional weight loss Diabetes: No  How often do you need to have someone help you when you read instructions, pamphlets, or other written materials from your doctor or pharmacy?: 3 - Sometimes  Diabetic?Yes Nutrition Risk Assessment:  Has the patient had any N/V/D within the last 2 months?  No  Does the patient have any non-healing wounds?  No  Has the patient had any unintentional weight loss or weight gain?  Yes   Diabetes:  Is the patient diabetic?  Yes  If diabetic, was a CBG obtained today?  Yes  Did the patient bring in their glucometer from home?  Yes  How often do you monitor your CBG's? Once a week.   Financial Strains and Diabetes  Management:  Are you having any financial strains with the device, your supplies or your medication? No .  Does the patient want to be seen by Chronic Care Management for management of their diabetes?  No  Would the patient like to be referred to a Nutritionist or for Diabetic Management?  No   Diabetic Exams:  Diabetic Eye Exam: Overdue for diabetic eye exam. Pt has been advised about the importance in completing this exam. Patient advised to call and schedule an eye exam. Diabetic Foot Exam: Completed 08/09/2021   Interpreter Needed?: No  Information entered by :: Donnie [place], [place]   Activities of Daily Living    12/17/2021    9:00 AM  In your present state of health, do you have any difficulty performing the following activities:  Hearing? 0  Vision? 1  Difficulty concentrating or making decisions? 0  Walking or climbing stairs? 1  Dressing or bathing? 1  Doing errands, shopping? 0    Patient Care Team: Juline Patch, MD as PCP - General (Family Medicine) Isaias Cowman, MD as Consulting Physician (Cardiology) Marlowe Sax, MD as Referring Physician (Rheumatology)  Indicate any recent Medical Services you may have received from other than [facility] providers in the past year (date may be approximate). The pt was seen in the ER on 09/12/2021 Rash and nonspecific skin, nausea and vomiting.    Assessment:   This is a routine wellness examination for [place].  Hearing/Vision screen Denies any hearing issues. Denies any vision changes. Due for an Annual Eye Exam, last eye exam was at [facility] 1.5 yrs ago  Dietary issues and exercise activities discussed: Current Exercise Habits: Home exercise routine, Type of exercise: walking, Time (Minutes): 15, Frequency (Times/Week): 3, Weekly Exercise (Minutes/Week): 45, Intensity: Mild   Goals Addressed   None    Depression Screen    12/17/2021  8:59 AM 09/27/2021    4:07 PM 08/09/2021   11:16 AM  01/07/2021   10:10 AM 11/22/2020   11:14 AM 11/22/2020   11:12 AM 08/25/2020   11:17 AM  PHQ 2/9 Scores  PHQ - 2 Score '1 4 1 2 '$ 0 0 3  PHQ- 9 Score  '11 4 7 2  9    '$ Fall Risk    12/17/2021    9:00 AM 09/27/2021    4:06 PM 01/01/2021   10:18 AM 11/22/2020   11:09 AM 05/28/2020   11:21 AM  Fall Risk   Falls in the past year? 0 0  0 0  Number falls in past yr: 0 0 0 0   Comment   getting out of bed,  chair on porch collasped.    Injury with Fall? 0 0 0 0   Risk for fall due to : No Fall Risks Impaired balance/gait Medication side effect;Impaired mobility Impaired mobility;Impaired balance/gait   Follow up Falls evaluation completed Falls evaluation completed Falls prevention discussed;Education provided Falls evaluation completed Falls evaluation completed    FALL RISK PREVENTION PERTAINING TO THE HOME:  Any stairs in or around the home? No  If so, are there any without handrails? Yes  Home free of loose throw rugs in walkways, pet beds, electrical cords, etc? Yes  Adequate lighting in your home to reduce risk of falls? Yes   ASSISTIVE DEVICES UTILIZED TO PREVENT FALLS:  Life alert? Yes Use of a cane, walker or w/c? Yes  Grab bars in the bathroom? Yes  Shower chair or bench in shower? Yes  Elevated toilet seat or a handicapped toilet? Yes   TIMED UP AND GO:  Was the test performed?  unable to preform, virtual appt .    Cognitive Function:        12/17/2021    9:01 AM 11/22/2020   11:02 AM 09/06/2017   11:52 AM  6CIT Screen  What Year? 0 points 0 points 0 points  What month? 0 points 0 points 0 points  What time? 0 points 0 points 3 points  Count back from 20 0 points 0 points 0 points  Months in reverse 0 points 2 points 4 points  Repeat phrase 4 points 0 points 2 points  Total Score 4 points 2 points 9 points    Immunizations Immunization History  Administered Date(s) Administered   Influenza, High Dose Seasonal PF 12/05/2017   Influenza,inj,Quad PF,6+ Mos  11/26/2014   Influenza,inj,quad, With Preservative 12/05/2017   Influenza-Unspecified 12/06/2015, 12/07/2019, 12/05/2020   PPD Test 02/22/2008   Pneumococcal Conjugate-13 09/06/2017    TDAP status: Due, Education has been provided regarding the importance of this vaccine. Advised may receive this vaccine at local pharmacy or Health Dept. Aware to provide a copy of the vaccination record if obtained from local pharmacy or Health Dept. Verbalized acceptance and understanding.  Flu Vaccine status: Due, Education has been provided regarding the importance of this vaccine. Advised may receive this vaccine at local pharmacy or Health Dept. Aware to provide a copy of the vaccination record if obtained from local pharmacy or Health Dept. Verbalized acceptance and understanding.  Pneumococcal vaccine status: Due, Education has been provided regarding the importance of this vaccine. Advised may receive this vaccine at local pharmacy or Health Dept. Aware to provide a copy of the vaccination record if obtained from local pharmacy or Health Dept. Verbalized acceptance and understanding.  Covid-19 vaccine status: Information provided on how to obtain vaccines.  Qualifies for Shingles Vaccine? Yes   Zostavax completed No   Shingrix Completed?: No.    Education has been provided regarding the importance of this vaccine. Patient has been advised to call insurance company to determine out of pocket expense if they have not yet received this vaccine. Advised may also receive vaccine at local pharmacy or Health Dept. Verbalized acceptance and understanding.  Screening Tests Health Maintenance  Topic Date Due   Zoster Vaccines- Shingrix (1 of 2) Never done   OPHTHALMOLOGY EXAM  09/04/2021   INFLUENZA VACCINE  10/05/2021   Pneumonia Vaccine 61+ Years old (2 - PPSV23 or PCV20) 08/10/2022 (Originally 11/01/2017)   TETANUS/TDAP  08/10/2022 (Originally 07/31/1969)   HEMOGLOBIN A1C  02/08/2022   Diabetic kidney  evaluation - GFR measurement  08/10/2022   FOOT EXAM  08/10/2022   Diabetic kidney evaluation - Urine ACR  09/17/2022   COLONOSCOPY (Pts 45-40yr Insurance coverage will need to be confirmed)  01/01/2031   Hepatitis C Screening  Completed   HPV VACCINES  Aged Out   COVID-19 Vaccine  Discontinued    Health Maintenance  Health Maintenance Due  Topic Date Due   Zoster Vaccines- Shingrix (1 of 2) Never done   OPHTHALMOLOGY EXAM  09/04/2021   INFLUENZA VACCINE  10/05/2021    Colorectal cancer screening: Type of screening: Colonoscopy. Completed 12/31/2020. Repeat every 10 years  Lung Cancer Screening: (Low Dose CT Chest recommended if Age 73-80years, 30 pack-year currently smoking OR have quit w/in 15years.) does qualify.    Additional Screening:  Hepatitis C Screening: does qualify; Completed 09/08/2017  Vision Screening: Recommended annual ophthalmology exams for early detection of glaucoma and other disorders of the eye. Is the patient up to date with their annual eye exam?  No  Who is the provider or what is the name of the office in which the patient attends annual eye exams? [facility] If pt is not established with a provider, would they like to be referred to a provider to establish care? No .   Dental Screening: Recommended annual dental exams for proper oral hygiene  Community Resource Referral / Chronic Care Management: CRR required this visit?  No   CCM required this visit?  No      Plan:     I have personally reviewed and noted the following in the patient's chart:   Medical and social history Use of alcohol, tobacco or illicit drugs  Current medications and supplements including opioid prescriptions. Patient is not currently taking opioid prescriptions. Functional ability and status Nutritional status Physical activity Advanced directives List of other physicians Hospitalizations, surgeries, and ER visits in previous 12  months Vitals Screenings to include cognitive, depression, and falls Referrals and appointments  In addition, I have reviewed and discussed with patient certain preventive protocols, quality metrics, and best practice recommendations. A written personalized care plan for preventive services as well as general preventive health recommendations were provided to patient.    Mr. AMantz, Thank you for taking time to come for your Medicare Wellness Visit. I appreciate your ongoing commitment to your health goals. Please review the following plan we discussed and let me know if I can assist you in the future.   These are the goals we discussed:  Goals      DIET - INCREASE WATER INTAKE     Recommend to drink at least 6-8 8oz glasses of water per day.     Prevent falls  Recommend to remove any items from the home that may cause slips or trips.        This is a list of the screening recommended for you and due dates:  Health Maintenance  Topic Date Due   Zoster (Shingles) Vaccine (1 of 2) Never done   Eye exam for diabetics  09/04/2021   Flu Shot  10/05/2021   Pneumonia Vaccine (2 - PPSV23 or PCV20) 08/10/2022*   Tetanus Vaccine  08/10/2022*   Hemoglobin A1C  02/08/2022   Yearly kidney function blood test for diabetes  08/10/2022   Complete foot exam   08/10/2022   Yearly kidney health urinalysis for diabetes  09/17/2022   Colon Cancer Screening  01/01/2031   Hepatitis C Screening: USPSTF Recommendation to screen - Ages 18-79 yo.  Completed   HPV Vaccine  Aged Out   COVID-19 Vaccine  Discontinued  *Topic was postponed. The date shown is not the original due date.     Wilson Singer, [place]   12/17/2021 Nurse Notes: Approximately 30 minute Non-Face-To-Face Visit

## 2021-12-16 NOTE — Patient Instructions (Signed)
Health Maintenance, Male Adopting a healthy lifestyle and getting preventive care are important in promoting health and wellness. Ask your health care provider about: The right schedule for you to have regular tests and exams. Things you can do on your own to prevent diseases and keep yourself healthy. What should I know about diet, weight, and exercise? Eat a healthy diet  Eat a diet that includes plenty of vegetables, fruits, low-fat dairy products, and lean protein. Do not eat a lot of foods that are high in solid fats, added sugars, or sodium. Maintain a healthy weight Body mass index (BMI) is a measurement that can be used to identify possible weight problems. It estimates body fat based on height and weight. Your health care provider can help determine your BMI and help you achieve or maintain a healthy weight. Get regular exercise Get regular exercise. This is one of the most important things you can do for your health. Most adults should: Exercise for at least 150 minutes each week. The exercise should increase your heart rate and make you sweat (moderate-intensity exercise). Do strengthening exercises at least twice a week. This is in addition to the moderate-intensity exercise. Spend less time sitting. Even light physical activity can be beneficial. Watch cholesterol and blood lipids Have your blood tested for lipids and cholesterol at 73 years of age, then have this test every 5 years. You may need to have your cholesterol levels checked more often if: Your lipid or cholesterol levels are high. You are older than 73 years of age. You are at high risk for heart disease. What should I know about cancer screening? Many types of cancers can be detected early and may often be prevented. Depending on your health history and family history, you may need to have cancer screening at various ages. This may include screening for: Colorectal cancer. Prostate cancer. Skin cancer. Lung  cancer. What should I know about heart disease, diabetes, and high blood pressure? Blood pressure and heart disease High blood pressure causes heart disease and increases the risk of stroke. This is more likely to develop in people who have high blood pressure readings or are overweight. Talk with your health care provider about your target blood pressure readings. Have your blood pressure checked: Every 3-5 years if you are 18-39 years of age. Every year if you are 40 years old or older. If you are between the ages of 65 and 75 and are a current or former smoker, ask your health care provider if you should have a one-time screening for abdominal aortic aneurysm (AAA). Diabetes Have regular diabetes screenings. This checks your fasting blood sugar level. Have the screening done: Once every three years after age 45 if you are at a normal weight and have a low risk for diabetes. More often and at a younger age if you are overweight or have a high risk for diabetes. What should I know about preventing infection? Hepatitis B If you have a higher risk for hepatitis B, you should be screened for this virus. Talk with your health care provider to find out if you are at risk for hepatitis B infection. Hepatitis C Blood testing is recommended for: Everyone born from 1945 through 1965. Anyone with known risk factors for hepatitis C. Sexually transmitted infections (STIs) You should be screened each year for STIs, including gonorrhea and chlamydia, if: You are sexually active and are younger than 73 years of age. You are older than 73 years of age and your   health care provider tells you that you are at risk for this type of infection. Your sexual activity has changed since you were last screened, and you are at increased risk for chlamydia or gonorrhea. Ask your health care provider if you are at risk. Ask your health care provider about whether you are at high risk for HIV. Your health care provider  may recommend a prescription medicine to help prevent HIV infection. If you choose to take medicine to prevent HIV, you should first get tested for HIV. You should then be tested every 3 months for as long as you are taking the medicine. Follow these instructions at home: Alcohol use Do not drink alcohol if your health care provider tells you not to drink. If you drink alcohol: Limit how much you have to 0-2 drinks a day. Know how much alcohol is in your drink. In the [place] one drink equals one 12 oz bottle of beer (355 mL), one 5 oz glass of wine (148 mL), or one 1 oz glass of hard liquor (44 mL). Lifestyle Do not use any products that contain nicotine or tobacco. These products include cigarettes, chewing tobacco, and vaping devices, such as e-cigarettes. If you need help quitting, ask your health care provider. Do not use street drugs. Do not share needles. Ask your health care provider for help if you need support or information about quitting drugs. General instructions Schedule regular health, dental, and eye exams. Stay current with your vaccines. Tell your health care provider if: You often feel depressed. You have ever been abused or do not feel safe at home. Summary Adopting a healthy lifestyle and getting preventive care are important in promoting health and wellness. Follow your health care provider's instructions about healthy diet, exercising, and getting tested or screened for diseases. Follow your health care provider's instructions on monitoring your cholesterol and blood pressure. This information is not intended to replace advice given to you by your health care provider. Make sure you discuss any questions you have with your health care provider. Document Revised: 07/13/2020 Document Reviewed: 07/13/2020 Elsevier Patient Education  2023 [employer]  

## 2021-12-17 ENCOUNTER — Ambulatory Visit (INDEPENDENT_AMBULATORY_CARE_PROVIDER_SITE_OTHER): Payer: Medicare Other

## 2021-12-17 DIAGNOSIS — Z0000 Encounter for general adult medical examination without abnormal findings: Secondary | ICD-10-CM

## 2021-12-22 DIAGNOSIS — E875 Hyperkalemia: Secondary | ICD-10-CM | POA: Diagnosis not present

## 2021-12-22 DIAGNOSIS — N2581 Secondary hyperparathyroidism of renal origin: Secondary | ICD-10-CM | POA: Diagnosis not present

## 2021-12-22 DIAGNOSIS — D631 Anemia in chronic kidney disease: Secondary | ICD-10-CM | POA: Diagnosis not present

## 2021-12-22 DIAGNOSIS — N1832 Chronic kidney disease, stage 3b: Secondary | ICD-10-CM | POA: Diagnosis not present

## 2021-12-23 DIAGNOSIS — L298 Other pruritus: Secondary | ICD-10-CM | POA: Diagnosis not present

## 2021-12-23 DIAGNOSIS — L2089 Other atopic dermatitis: Secondary | ICD-10-CM | POA: Diagnosis not present

## 2021-12-23 DIAGNOSIS — Z79899 Other long term (current) drug therapy: Secondary | ICD-10-CM | POA: Diagnosis not present

## 2021-12-30 DIAGNOSIS — G4733 Obstructive sleep apnea (adult) (pediatric): Secondary | ICD-10-CM | POA: Diagnosis not present

## 2021-12-30 DIAGNOSIS — I130 Hypertensive heart and chronic kidney disease with heart failure and stage 1 through stage 4 chronic kidney disease, or unspecified chronic kidney disease: Secondary | ICD-10-CM | POA: Diagnosis not present

## 2021-12-30 DIAGNOSIS — I509 Heart failure, unspecified: Secondary | ICD-10-CM | POA: Diagnosis not present

## 2021-12-30 DIAGNOSIS — N179 Acute kidney failure, unspecified: Secondary | ICD-10-CM | POA: Diagnosis not present

## 2021-12-30 DIAGNOSIS — R21 Rash and other nonspecific skin eruption: Secondary | ICD-10-CM | POA: Diagnosis not present

## 2021-12-30 DIAGNOSIS — M069 Rheumatoid arthritis, unspecified: Secondary | ICD-10-CM | POA: Diagnosis not present

## 2021-12-30 DIAGNOSIS — N183 Chronic kidney disease, stage 3 unspecified: Secondary | ICD-10-CM | POA: Diagnosis not present

## 2021-12-30 DIAGNOSIS — J449 Chronic obstructive pulmonary disease, unspecified: Secondary | ICD-10-CM | POA: Diagnosis not present

## 2021-12-30 DIAGNOSIS — E1122 Type 2 diabetes mellitus with diabetic chronic kidney disease: Secondary | ICD-10-CM | POA: Diagnosis not present

## 2021-12-30 DIAGNOSIS — F17210 Nicotine dependence, cigarettes, uncomplicated: Secondary | ICD-10-CM | POA: Diagnosis not present

## 2022-02-07 ENCOUNTER — Ambulatory Visit: Payer: Medicaid Other | Admitting: Gastroenterology

## 2022-02-07 NOTE — Progress Notes (Deleted)
Primary Care Physician: Juline Patch, MD  Primary Gastroenterologist:  Dr. [name]  No chief complaint on file.   HPI: Daniel Frye is a 71 y.o. male here for weight loss with nausea and vomiting.  The patient had a colonoscopy by me last year in October that showed 2 adenomatous polyps.  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     albuterol (PROAIR HFA) 108 (90 Base) MCG/ACT inhaler INHALE 2 PUFFS BY MOUTH EVERY 4 HOURS AS NEEDED FOR  WHEEZING  OR  SHORTNESS  OF  BREATH 27 g 0   albuterol (PROVENTIL) (2.5 MG/3ML) 0.083% nebulizer solution Take 3 mLs (2.5 mg total) by nebulization every 6 (six) hours as needed for wheezing or shortness of breath. 75 mL 0   DUPIXENT 300 MG/2ML SOPN Inject into the skin. (Patient not taking: Reported on 12/17/2021)     Ensure Plus (ENSURE PLUS) LIQD Take 237 mLs by mouth.     folic acid (FOLVITE) 1 MG tablet Take 1 mg by mouth daily. (Patient not taking: Reported on 12/17/2021)     gabapentin (NEURONTIN) 100 MG capsule Take 1 capsule (100 mg total) by mouth 2 (two) times daily. (Patient not taking: Reported on 12/17/2021) 90 capsule 3   hydrochlorothiazide (HYDRODIURIL) 12.5 MG tablet Take 1 tablet (12.5 mg total) by mouth daily. 90 tablet 1   methotrexate 2.5 MG tablet Take 8 tablets by mouth once a week. (Patient not taking: Reported on 12/17/2021)     mirtazapine (REMERON) 15 MG tablet Take 1 tablet (15 mg total) by mouth at bedtime. (Patient not  taking: Reported on 09/27/2021) 30 tablet 11   permethrin (ELIMITE) 5 % cream Apply to all areas of the body from the neck to soles of feet, also apply on the hairline, neck, scalp, temple, and forehead; leave on for 8 to 14 hours before removing by washing (shower or bath) (Patient not taking: Reported on 12/17/2021)     thiamine (VITAMIN B-1) 100 MG tablet Take by mouth.     TRELEGY ELLIPTA 100-62.5-25 MCG/INH AEPB SMARTSIG:1 Via Inhaler Daily PRN     triamcinolone ointment (KENALOG) 0.1 % Apply topically. (Patient not taking: Reported on 12/17/2021)     No current facility-administered medications for this visit.    Allergies as of 02/07/2022 - Review Complete 09/27/2021  Allergen Reaction Noted   Renflexis [infliximab] Anaphylaxis 07/30/2019    ROS:  General: Negative for anorexia, weight loss, fever, chills, fatigue, weakness. ENT: Negative for hoarseness, difficulty swallowing , nasal congestion. CV: Negative for chest pain, angina, palpitations, dyspnea on exertion, peripheral edema.  Respiratory: Negative for dyspnea at rest, dyspnea on exertion, cough, sputum, wheezing.  GI: See history of present illness. GU:  Negative for dysuria, hematuria, urinary incontinence, urinary frequency, nocturnal urination.  Endo: Negative for unusual weight change.    Physical Examination:   There were no vitals taken for  this visit.  General: Well-nourished, well-developed in no acute distress.  Eyes: No icterus. Conjunctivae pink. Lungs: Clear to auscultation bilaterally. Non-labored. Heart: Regular rate and rhythm, no murmurs rubs or gallops.  Abdomen: Bowel sounds are normal, nontender, nondistended, no hepatosplenomegaly or masses, no abdominal bruits or hernia , no rebound or guarding.   Extremities: No lower extremity edema. No clubbing or deformities. Neuro: Alert and oriented x 3.  Grossly intact. Skin: Warm and dry, no jaundice.   Psych: Alert and cooperative, normal mood and  affect.  Labs:    Imaging Studies: No results found.  Assessment and Plan:   [name] is a 71 y.o. y/o male ***     [name], MD. Marval Regal    Note: This dictation was prepared with Dragon dictation along with smaller phrase technology. Any transcriptional errors that result from this process are unintentional.

## 2022-02-08 ENCOUNTER — Encounter: Payer: Self-pay | Admitting: Family Medicine

## 2022-02-08 ENCOUNTER — Ambulatory Visit: Payer: Medicare Other | Admitting: Family Medicine

## 2022-02-08 VITALS — BP 100/78 | HR 60 | Ht 69.0 in | Wt 174.0 lb

## 2022-02-08 DIAGNOSIS — N1832 Chronic kidney disease, stage 3b: Secondary | ICD-10-CM | POA: Diagnosis not present

## 2022-02-08 DIAGNOSIS — I10 Essential (primary) hypertension: Secondary | ICD-10-CM | POA: Diagnosis not present

## 2022-02-08 MED ORDER — HYDROCHLOROTHIAZIDE 12.5 MG PO TABS: 12.5000 mg | ORAL_TABLET | Freq: Every day | ORAL | 1 refills | Status: DC

## 2022-02-08 NOTE — Progress Notes (Signed)
Date:  02/08/2022   Name:  Daniel Frye   DOB:  1950-12-28   MRN:  [id number]   Chief Complaint: Hypertension  Hypertension This is a chronic problem. The current episode started more than 1 year ago. The problem has been gradually improving since onset. The problem is controlled. Pertinent negatives include no anxiety, blurred vision, chest pain, headaches, malaise/fatigue, orthopnea, palpitations, peripheral edema, PND, shortness of breath or sweats. There are no associated agents to hypertension. There are no known risk factors for coronary artery disease. Past treatments include diuretics. The current treatment provides moderate improvement. There are no compliance problems.  There is no history of angina, kidney disease, CAD/MI, CVA, heart failure, left ventricular hypertrophy, PVD or retinopathy. There is no history of chronic renal disease, a hypertension causing med or renovascular disease.    Lab Results  Component Value Date   NA 131 (L) 08/09/2021   K 5.5 (H) 08/09/2021   CO2 19 (L) 08/09/2021   GLUCOSE 95 08/09/2021   BUN 32 (H) 08/09/2021   CREATININE 1.96 (H) 08/09/2021   CALCIUM 9.6 08/09/2021   EGFR 35 (L) 08/09/2021   GFRNONAA >60 10/09/2019   No results found for: "CHOL", "HDL", "LDLCALC", "LDLDIRECT", "TRIG", "CHOLHDL" Lab Results  Component Value Date   TSH 0.476 08/25/2020   Lab Results  Component Value Date   HGBA1C 5.8 (H) 08/09/2021   Lab Results  Component Value Date   WBC 3.4 08/25/2020   HGB 12.6 (L) 08/25/2020   HCT 37.3 (L) 08/25/2020   MCV 96 08/25/2020   PLT 73 (LL) 08/25/2020   Lab Results  Component Value Date   ALT 20 08/09/2021   AST 21 08/09/2021   ALKPHOS 114 08/09/2021   BILITOT 0.3 08/09/2021   Lab Results  Component Value Date   VD25OH 11.1 (L) 01/26/2015     Review of Systems  Constitutional:  Negative for chills, fever and malaise/fatigue.  HENT:  Negative for sore throat and trouble swallowing.   Eyes:  Negative for  blurred vision.  Respiratory:  Negative for cough, shortness of breath and wheezing.        "Regular COPD"  Cardiovascular:  Negative for chest pain, palpitations, orthopnea and PND.  Gastrointestinal:  Negative for abdominal pain, blood in stool and nausea.  Endocrine: Negative for polydipsia.  Genitourinary:  Negative for dysuria, frequency, hematuria and urgency.  Skin:  Negative for rash.  Allergic/Immunologic: Negative for environmental allergies.  Neurological:  Negative for dizziness and headaches.       Pruritis  Hematological:  Does not bruise/bleed easily.  Psychiatric/Behavioral:  Negative for suicidal ideas. The patient is not nervous/anxious.     Patient Active Problem List   Diagnosis Date Noted   Polyp of transverse colon    Chronic pain syndrome 01/14/2020   Pharmacologic therapy 01/14/2020   Disorder of skeletal system 01/14/2020   Problems influencing health status 01/14/2020   History of substance use disorder 01/14/2020   Abnormal drug screens 01/14/2020   History of alcohol abuse 01/14/2020   History of cocaine use 01/14/2020   History of marijuana use 01/14/2020   History of tobacco abuse 01/14/2020   Acute anaphylaxis 07/31/2019   Acute respiratory failure with hypoxia ([place]) 07/30/2019   DDD (degenerative disc disease), lumbar 01/07/2019   Cervical spondylosis with myelopathy 08/01/2018   Neck pain 05/30/2018   Degenerative cervical spinal stenosis 05/29/2018   Aortic atherosclerosis ([place]) 05/15/2018   Depression 04/01/2018   Numbness and tingling in both  hands 03/13/2018   Encounter for long-term (current) use of high-risk medication 08/09/2017   Primary osteoarthritis of both knees 08/09/2017   Hypokalemia 03/03/2016   Hypomagnesemia 03/03/2016   Substance induced mood disorder ([place]) 03/02/2016   Alcohol withdrawal ([place]) 02/29/2016   Crack cocaine use 01/22/2016   Obstructive sleep apnea of adult 03/30/2015   Edema of foot 03/30/2015   Chronic  diastolic CHF (congestive heart failure), NYHA class 2 ([place]) 03/30/2015   Apnea, sleep 03/30/2015   Alcohol abuse 03/23/2015   Noncompliance 03/23/2015   Vitamin D deficiency 01/27/2015   Diabetes mellitus type 2, controlled, without complications ([place]) 16/03/930   Obesity (BMI 30.0-34.9) 11/26/2014   COPD (chronic obstructive pulmonary disease) ([place]) 06/22/2014   Essential (primary) hypertension 06/22/2014   Rheumatoid arthritis involving multiple sites ([place]) 06/22/2014   Tobacco abuse 06/22/2014   Chronic obstructive pulmonary disease with acute exacerbation ([place]) 06/22/2014   History of drug abuse ([place]) 03/05/2009   SOB (shortness of breath) on exertion 03/05/2009   Alcohol abuse, daily use 03/05/2009   Cocaine dependence, abuse ([place]) 03/05/2009   Fever 03/05/2009   Wheezing 03/05/2009    Allergies  Allergen Reactions   Renflexis [Infliximab] Anaphylaxis    Past Surgical History:  Procedure Laterality Date   BACK SURGERY     COLONOSCOPY WITH PROPOFOL N/A 12/31/2020   Procedure: COLONOSCOPY WITH PROPOFOL;  Surgeon: [name], MD;  [facility] ENDOSCOPY;  Service: Endoscopy;  Laterality: N/A;   heart surgery to drain fluid N/A 03/07/1985   Heart surgery to remove fluid     Social History   Tobacco Use   Smoking status: Every Day    Packs/day: 0.25    Years: 51.00    Total pack years: 12.75    Types: Cigarettes, Cigars   Smokeless tobacco: Never   Tobacco comments:    Wants to quit no quit date set  Vaping Use   Vaping Use: Never used  Substance Use Topics   Alcohol use: Yes    Alcohol/week: 49.0 standard drinks of alcohol    Types: 49 Standard drinks or equivalent per week    Comment: Drinks 1/5 day of wine    Drug use: Not Currently    Comment: no drugs in 1 year and 3 months     Medication list has been reviewed and updated.  Current Meds  Medication Sig   Adalimumab (HUMIRA PEN) 40 MG/0.4ML PNKT Inject into the skin.   albuterol (PROAIR HFA)  108 (90 Base) MCG/ACT inhaler INHALE 2 PUFFS BY MOUTH EVERY 4 HOURS AS NEEDED FOR  WHEEZING  OR  SHORTNESS  OF  BREATH   albuterol (PROVENTIL) (2.5 MG/3ML) 0.083% nebulizer solution Take 3 mLs (2.5 mg total) by nebulization every 6 (six) hours as needed for wheezing or shortness of breath.   clobetasol ointment (TEMOVATE) 0.05 % derm   DUPIXENT 300 MG/2ML SOPN Inject into the skin.   Ensure Plus (ENSURE PLUS) LIQD Take 237 mLs by mouth.   hydrochlorothiazide (HYDRODIURIL) 12.5 MG tablet Take 1 tablet (12.5 mg total) by mouth daily.   TRELEGY ELLIPTA 100-62.5-25 MCG/INH AEPB SMARTSIG:1 Via Inhaler Daily PRN   [DISCONTINUED] thiamine (VITAMIN B-1) 100 MG tablet Take by mouth.       02/08/2022   10:21 AM 09/27/2021    4:07 PM 08/09/2021   11:17 AM 11/22/2020   11:19 AM  GAD 7 : Generalized Anxiety Score  Nervous, Anxious, on Edge 0 1 0 0  Control/stop worrying 0 1 0 0  Worry too much - different things 0 1 0 0  Trouble relaxing 0 0 0 0  Restless 0 0 0 0  Easily annoyed or irritable 0 0 0 0  Afraid - awful might happen 0 0 2 0  Total GAD 7 Score 0 3 2 0  Anxiety Difficulty Not difficult at all Extremely difficult Somewhat difficult Not difficult at all       02/08/2022   10:21 AM 12/17/2021    8:59 AM 09/27/2021    4:07 PM  Depression screen PHQ 2/9  Decreased Interest 0 0 3  Down, Depressed, Hopeless 0 1 1  PHQ - 2 Score 0 1 4  Altered sleeping 0  0  Tired, decreased energy 0  1  Change in appetite 0  2  Feeling bad or failure about yourself  0  1  Trouble concentrating 0  1  Moving slowly or fidgety/restless 0  1  Suicidal thoughts 0  1  PHQ-9 Score 0  11  Difficult doing work/chores Not difficult at all  Very difficult    BP Readings from Last 3 Encounters:  02/08/22 100/78  09/27/21 100/80  08/09/21 120/80    Physical Exam Vitals and nursing note reviewed.  HENT:     Head: Normocephalic.     Right Ear: Tympanic membrane and external ear normal.     Left Ear:  Tympanic membrane and external ear normal.     Nose: Nose normal.  Eyes:     General: No scleral icterus.       Right eye: No discharge.        Left eye: No discharge.     Conjunctiva/sclera: Conjunctivae normal.     Pupils: Pupils are equal, round, and reactive to light.  Neck:     Thyroid: No thyromegaly.     Vascular: No JVD.     Trachea: No tracheal deviation.  Cardiovascular:     Rate and Rhythm: Normal rate and regular rhythm.     Heart sounds: Normal heart sounds. No murmur heard.    No friction rub. No gallop.  Pulmonary:     Effort: No respiratory distress.     Breath sounds: Normal breath sounds. No wheezing, rhonchi or rales.  Chest:     Chest wall: No tenderness.  Abdominal:     General: Bowel sounds are normal.     Palpations: Abdomen is soft. There is no mass.     Tenderness: There is no abdominal tenderness. There is no guarding or rebound.  Musculoskeletal:        General: No tenderness. Normal range of motion.     Cervical back: Neck supple.  Lymphadenopathy:     Cervical: No cervical adenopathy.  Skin:    General: Skin is warm.     Findings: No rash.  Neurological:     Mental Status: He is alert.     Wt Readings from Last 3 Encounters:  02/08/22 174 lb (78.9 kg)  09/27/21 175 lb (79.4 kg)  08/09/21 186 lb (84.4 kg)    BP 100/78   Pulse 60   Ht _0  (1.753 m)   Wt 174 lb (78.9 kg)   SpO2 98%   BMI 25.70 kg/m   Assessment and Plan:  1. Primary hypertension Chronic.  Controlled.  Stable.  Blood pressure today is 100/78.  Asymptomatic.  Tolerating medication well.  Continue hydrochlorothiazide 12.5 mg once a day.  Will recheck in 6 months. - hydrochlorothiazide (HYDRODIURIL) 12.5 MG tablet;  Take 1 tablet (12.5 mg total) by mouth daily.  Dispense: 90 tablet; Refill: 1  2. Stage 3b chronic kidney disease (CKD) (HCC) Chronic.  Controlled.  Stable.  Follow-up with Dr. Zollie Scale who is ordered an ultrasound presumably to evaluate for progression of  kidney disease. - US RENAL    Otilio Miu, MD

## 2022-02-12 IMAGING — CR DG CHEST 1V PORT
1 series · 1 of 1 positions shown · non-contrast
Comparison: 07/30/2019

CLINICAL DATA: Shortness of breath increasing over 2 weeks

EXAM:
PORTABLE CHEST 1 VIEW

[chest ap]
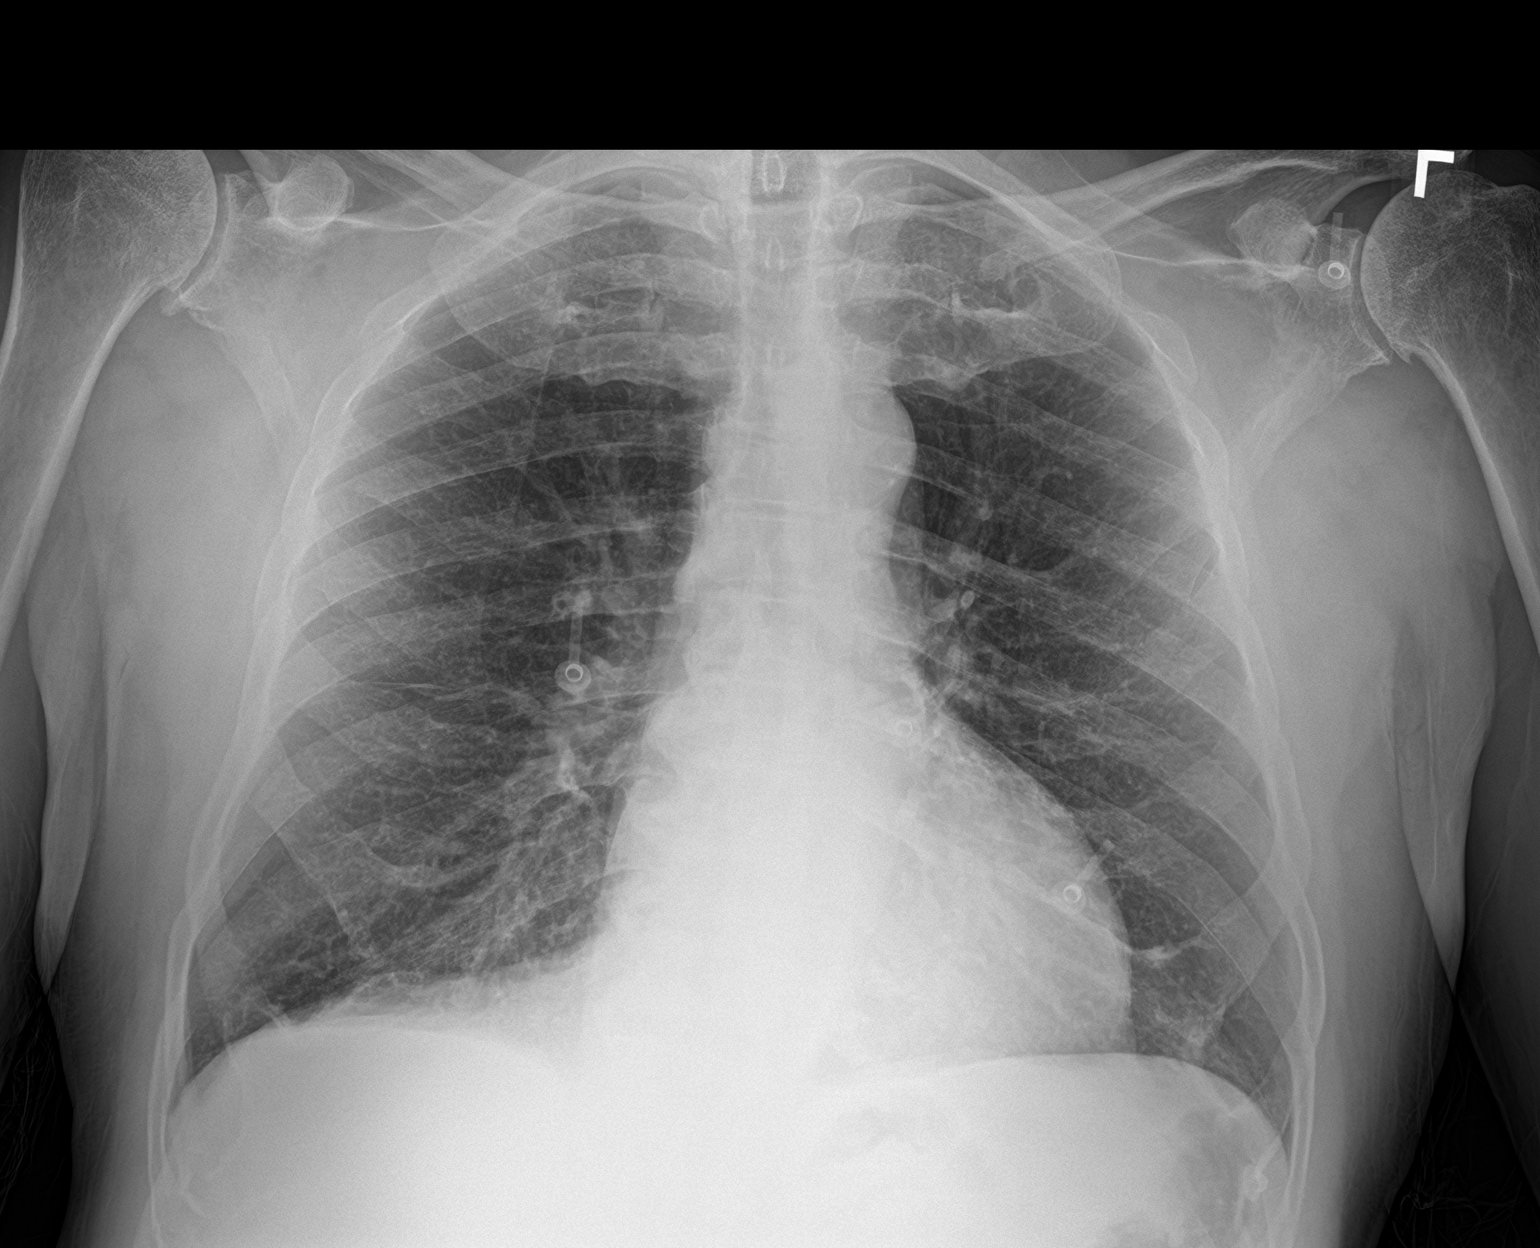

[1 of 1 positions shown; findings below may reference images not displayed]

FINDINGS: Cardiac shadow is stable. Lungs are clear. No focal infiltrate is
noted. No acute bony abnormality is noted.
IMPRESSION: No acute abnormality seen.

## 2022-02-16 DIAGNOSIS — M0579 Rheumatoid arthritis with rheumatoid factor of multiple sites without organ or systems involvement: Secondary | ICD-10-CM | POA: Diagnosis not present

## 2022-02-16 DIAGNOSIS — Z79899 Other long term (current) drug therapy: Secondary | ICD-10-CM | POA: Diagnosis not present

## 2022-02-16 DIAGNOSIS — M5136 Other intervertebral disc degeneration, lumbar region: Secondary | ICD-10-CM | POA: Diagnosis not present

## 2022-02-16 DIAGNOSIS — M170 Bilateral primary osteoarthritis of knee: Secondary | ICD-10-CM | POA: Diagnosis not present

## 2022-04-05 DIAGNOSIS — E119 Type 2 diabetes mellitus without complications: Secondary | ICD-10-CM | POA: Diagnosis not present

## 2022-04-05 LAB — HM DIABETES EYE EXAM

## 2022-04-06 DIAGNOSIS — H524 Presbyopia: Secondary | ICD-10-CM | POA: Diagnosis not present

## 2022-05-02 DIAGNOSIS — I10 Essential (primary) hypertension: Secondary | ICD-10-CM | POA: Diagnosis not present

## 2022-05-02 DIAGNOSIS — E875 Hyperkalemia: Secondary | ICD-10-CM | POA: Diagnosis not present

## 2022-05-02 DIAGNOSIS — N2581 Secondary hyperparathyroidism of renal origin: Secondary | ICD-10-CM | POA: Diagnosis not present

## 2022-05-02 DIAGNOSIS — N1832 Chronic kidney disease, stage 3b: Secondary | ICD-10-CM | POA: Diagnosis not present

## 2022-07-06 ENCOUNTER — Other Ambulatory Visit: Payer: Self-pay | Admitting: Family Medicine

## 2022-07-06 DIAGNOSIS — L2084 Intrinsic (allergic) eczema: Secondary | ICD-10-CM

## 2022-07-07 NOTE — Telephone Encounter (Signed)
Unable to refill per protocol, Rx expired. Discontinued 08/09/21, course completed.  Requested Prescriptions  Pending Prescriptions Disp Refills   hydrOXYzine (ATARAX) 10 MG tablet [Pharmacy Med Name: hydrOXYzine HCl 10 MG Oral Tablet] 30 tablet 0    Sig: Take 1 tablet by mouth three times daily as needed     Ear, Nose, and Throat:  Antihistamines 2 Failed - 07/06/2022 10:34 AM      Failed - Cr in normal range and within 360 days    Creatinine, Ser  Date Value Ref Range Status  08/09/2021 1.96 (H) 0.76 - 1.27 mg/dL Final         Passed - Valid encounter within last 12 months    Recent Outpatient Visits           4 months ago Primary hypertension   [facility]   9 months ago Vomiting without nausea, unspecified vomiting type   [facility]   11 months ago Primary hypertension   [facility]   1 year ago Primary hypertension   [facility]   1 year ago Chronic fatigue   [facility] [facility], [facility]       Future Appointments             In 1 month Duanne Limerick, MD [facility] at [facility], [facility]

## 2022-07-25 ENCOUNTER — Ambulatory Visit: Payer: Medicare Other | Admitting: Family Medicine

## 2022-07-25 DIAGNOSIS — I10 Essential (primary) hypertension: Secondary | ICD-10-CM

## 2022-07-25 DIAGNOSIS — J441 Chronic obstructive pulmonary disease with (acute) exacerbation: Secondary | ICD-10-CM

## 2022-07-27 ENCOUNTER — Encounter: Payer: Self-pay | Admitting: Family Medicine

## 2022-08-10 ENCOUNTER — Ambulatory Visit: Payer: Medicare Other | Admitting: Family Medicine

## 2022-08-11 ENCOUNTER — Ambulatory Visit: Payer: Medicare Other | Admitting: Family Medicine

## 2022-08-11 DIAGNOSIS — M0550 Rheumatoid polyneuropathy with rheumatoid arthritis of unspecified site: Secondary | ICD-10-CM | POA: Diagnosis not present

## 2022-08-11 DIAGNOSIS — G8929 Other chronic pain: Secondary | ICD-10-CM | POA: Diagnosis not present

## 2022-08-16 ENCOUNTER — Ambulatory Visit: Payer: Self-pay | Admitting: *Deleted

## 2022-08-16 ENCOUNTER — Encounter: Payer: Self-pay | Admitting: Family Medicine

## 2022-08-16 ENCOUNTER — Ambulatory Visit (INDEPENDENT_AMBULATORY_CARE_PROVIDER_SITE_OTHER): Payer: Medicare Other | Admitting: Family Medicine

## 2022-08-16 VITALS — BP 120/70 | HR 98 | Ht 69.0 in | Wt 182.0 lb

## 2022-08-16 DIAGNOSIS — I10 Essential (primary) hypertension: Secondary | ICD-10-CM | POA: Diagnosis not present

## 2022-08-16 DIAGNOSIS — F32A Depression, unspecified: Secondary | ICD-10-CM

## 2022-08-16 MED ORDER — HYDROCHLOROTHIAZIDE 12.5 MG PO TABS: 12.5000 mg | ORAL_TABLET | Freq: Every day | ORAL | 1 refills | Status: DC

## 2022-08-16 MED ORDER — MIRTAZAPINE 15 MG PO TABS: 15.0000 mg | ORAL_TABLET | Freq: Every day | ORAL | 3 refills | Status: DC

## 2022-08-16 NOTE — Progress Notes (Signed)
Date:  08/16/2022   Name:  Daniel Frye   DOB:  07/22/1950   MRN:  [id number]   Chief Complaint: Depression (11 and 5) and Hypertension  Depression        This is a chronic problem.  The current episode started more than 1 year ago.   The onset quality is sudden.   The problem occurs intermittently.  The problem has been gradually improving since onset.  Associated symptoms include no decreased concentration, no fatigue, no helplessness, no hopelessness, does not have insomnia, not irritable, no restlessness, no decreased interest, no appetite change, no body aches, no myalgias, no headaches, no indigestion, not sad and no suicidal ideas.     Exacerbated by: alone.  Past treatments include other medications (remeron).  Compliance with treatment is good.  Past compliance problems include difficulty understanding directions and medical issues.  Previous treatment provided mild relief. Hypertension This is a chronic problem. The current episode started more than 1 year ago. The problem has been gradually improving since onset. The problem is controlled. Pertinent negatives include no chest pain, headaches, palpitations or shortness of breath. Past treatments include ACE inhibitors. The current treatment provides moderate improvement. There are no compliance problems.  There is no history of angina, CAD/MI or CVA.    Lab Results  Component Value Date   NA 131 (L) 08/09/2021   K 5.5 (H) 08/09/2021   CO2 19 (L) 08/09/2021   GLUCOSE 95 08/09/2021   BUN 32 (H) 08/09/2021   CREATININE 1.96 (H) 08/09/2021   CALCIUM 9.6 08/09/2021   EGFR 35 (L) 08/09/2021   GFRNONAA >60 10/09/2019   No results found for: "CHOL", "HDL", "LDLCALC", "LDLDIRECT", "TRIG", "CHOLHDL" Lab Results  Component Value Date   TSH 0.476 08/25/2020   Lab Results  Component Value Date   HGBA1C 5.8 (H) 08/09/2021   Lab Results  Component Value Date   WBC 3.4 08/25/2020   HGB 12.6 (L) 08/25/2020   HCT 37.3 (L) 08/25/2020    MCV 96 08/25/2020   PLT 73 (LL) 08/25/2020   Lab Results  Component Value Date   ALT 20 08/09/2021   AST 21 08/09/2021   ALKPHOS 114 08/09/2021   BILITOT 0.3 08/09/2021   Lab Results  Component Value Date   VD25OH 11.1 (L) 01/26/2015     Review of Systems  Constitutional:  Negative for appetite change and fatigue.  HENT:  Negative for drooling.   Eyes:  Negative for visual disturbance.  Respiratory:  Negative for choking, shortness of breath, wheezing and stridor.   Cardiovascular:  Negative for chest pain, palpitations and leg swelling.  Endocrine: Negative for polydipsia, polyphagia and polyuria.  Genitourinary:  Negative for difficulty urinating.  Musculoskeletal:  Negative for arthralgias and myalgias.  Neurological:  Negative for headaches.  Hematological:  Negative for adenopathy. Does not bruise/bleed easily.  Psychiatric/Behavioral:  Positive for depression. Negative for decreased concentration and suicidal ideas. The patient does not have insomnia.     Patient Active Problem List   Diagnosis Date Noted   Polyp of transverse colon    Chronic pain syndrome 01/14/2020   Pharmacologic therapy 01/14/2020   Disorder of skeletal system 01/14/2020   Problems influencing health status 01/14/2020   History of substance use disorder 01/14/2020   Abnormal drug screens 01/14/2020   History of alcohol abuse 01/14/2020   History of cocaine use 01/14/2020   History of marijuana use 01/14/2020   History of tobacco abuse 01/14/2020   Acute anaphylaxis  07/31/2019   Acute respiratory failure with hypoxia (HCC) 07/30/2019   DDD (degenerative disc disease), lumbar 01/07/2019   Cervical spondylosis with myelopathy 08/01/2018   Neck pain 05/30/2018   Degenerative cervical spinal stenosis 05/29/2018   Aortic atherosclerosis (HCC) 05/15/2018   Depression 04/01/2018   Numbness and tingling in both hands 03/13/2018   Encounter for long-term (current) use of high-risk medication  08/09/2017   Primary osteoarthritis of both knees 08/09/2017   Hypokalemia 03/03/2016   Hypomagnesemia 03/03/2016   Substance induced mood disorder (HCC) 03/02/2016   Alcohol withdrawal (HCC) 02/29/2016   Crack cocaine use 01/22/2016   Obstructive sleep apnea of adult 03/30/2015   Edema of foot 03/30/2015   Chronic diastolic CHF (congestive heart failure), NYHA class 2 (HCC) 03/30/2015   Apnea, sleep 03/30/2015   Alcohol abuse 03/23/2015   Noncompliance 03/23/2015   Vitamin D deficiency 01/27/2015   Diabetes mellitus type 2, controlled, without complications (HCC) 12/03/2014   Obesity (BMI 30.0-34.9) 11/26/2014   COPD (chronic obstructive pulmonary disease) (HCC) 06/22/2014   Essential (primary) hypertension 06/22/2014   Rheumatoid arthritis involving multiple sites (HCC) 06/22/2014   Tobacco abuse 06/22/2014   Chronic obstructive pulmonary disease with acute exacerbation (HCC) 06/22/2014   History of drug abuse (HCC) 03/05/2009   SOB (shortness of breath) on exertion 03/05/2009   Alcohol abuse, daily use 03/05/2009   Cocaine dependence, abuse (HCC) 03/05/2009   Fever 03/05/2009   Wheezing 03/05/2009    Allergies  Allergen Reactions   Renflexis [Infliximab] Anaphylaxis    Past Surgical History:  Procedure Laterality Date   BACK SURGERY     COLONOSCOPY WITH PROPOFOL N/A 12/31/2020   Procedure: COLONOSCOPY WITH PROPOFOL;  Surgeon: Midge Minium, MD;  Location: [facility] ENDOSCOPY;  Service: Endoscopy;  Laterality: N/A;   heart surgery to drain fluid N/A 03/07/1985   Heart surgery to remove fluid     Social History   Tobacco Use   Smoking status: Every Day    Packs/day: 0.25    Years: 51.00    Additional pack years: 0.00    Total pack years: 12.75    Types: Cigarettes, Cigars   Smokeless tobacco: Never   Tobacco comments:    Wants to quit no quit date set  Vaping Use   Vaping Use: Never used  Substance Use Topics   Alcohol use: Yes    Alcohol/week: 49.0 standard  drinks of alcohol    Types: 49 Standard drinks or equivalent per week    Comment: Drinks 1/5 day of wine    Drug use: Not Currently    Comment: no drugs in 1 year and 3 months     Medication list has been reviewed and updated.  Current Meds  Medication Sig   Adalimumab (HUMIRA PEN) 40 MG/0.4ML PNKT Inject into the skin.   albuterol (PROAIR HFA) 108 (90 Base) MCG/ACT inhaler INHALE 2 PUFFS BY MOUTH EVERY 4 HOURS AS NEEDED FOR  WHEEZING  OR  SHORTNESS  OF  BREATH   albuterol (PROVENTIL) (2.5 MG/3ML) 0.083% nebulizer solution Take 3 mLs (2.5 mg total) by nebulization every 6 (six) hours as needed for wheezing or shortness of breath.   clobetasol ointment (TEMOVATE) 0.05 % derm   DUPIXENT 300 MG/2ML SOPN Inject into the skin.   Ensure Plus (ENSURE PLUS) LIQD Take 237 mLs by mouth.   hydrochlorothiazide (HYDRODIURIL) 12.5 MG tablet Take 1 tablet (12.5 mg total) by mouth daily.   mirtazapine (REMERON) 15 MG tablet Take 1 tablet (15 mg total) by  mouth at bedtime.   TRELEGY ELLIPTA 100-62.5-25 MCG/INH AEPB SMARTSIG:1 Via Inhaler Daily PRN       08/16/2022    4:13 PM 02/08/2022   10:21 AM 09/27/2021    4:07 PM 08/09/2021   11:17 AM  GAD 7 : Generalized Anxiety Score  Nervous, Anxious, on Edge 1 0 1 0  Control/stop worrying 0 0 1 0  Worry too much - different things 0 0 1 0  Trouble relaxing 2 0 0 0  Restless 1 0 0 0  Easily annoyed or irritable 0 0 0 0  Afraid - awful might happen 1 0 0 2  Total GAD 7 Score 5 0 3 2  Anxiety Difficulty Somewhat difficult Not difficult at all Extremely difficult Somewhat difficult       08/16/2022    4:11 PM 02/08/2022   10:21 AM 12/17/2021    8:59 AM  Depression screen PHQ 2/9  Decreased Interest 1 0 0  Down, Depressed, Hopeless 3 0 1  PHQ - 2 Score 4 0 1  Altered sleeping 2 0   Tired, decreased energy 2 0   Change in appetite 0 0   Feeling bad or failure about yourself  1 0   Trouble concentrating 0 0   Moving slowly or fidgety/restless 2 0    Suicidal thoughts 0 0   PHQ-9 Score 11 0   Difficult doing work/chores Somewhat difficult Not difficult at all     BP Readings from Last 3 Encounters:  08/16/22 120/70  02/08/22 100/78  09/27/21 100/80    Physical Exam Vitals and nursing note reviewed.  Constitutional:      General: He is not irritable. HENT:     Head: Normocephalic and atraumatic.     Right Ear: Tympanic membrane, ear canal and external ear normal.     Left Ear: Tympanic membrane, ear canal and external ear normal.     Nose: Nose normal.     Mouth/Throat:     Mouth: Mucous membranes are moist.  Eyes:     Pupils: Pupils are equal, round, and reactive to light.  Neck:     Vascular: No carotid bruit.  Cardiovascular:     Rate and Rhythm: Normal rate and regular rhythm.     Heart sounds: No murmur heard.    No friction rub. No gallop.  Pulmonary:     Effort: No respiratory distress.     Breath sounds: No wheezing, rhonchi or rales.  Abdominal:     General: Abdomen is flat.  Musculoskeletal:     Cervical back: No tenderness.  Lymphadenopathy:     Cervical: No cervical adenopathy.     Wt Readings from Last 3 Encounters:  08/16/22 182 lb (82.6 kg)  02/08/22 174 lb (78.9 kg)  09/27/21 175 lb (79.4 kg)    BP 120/70   Pulse 98   Ht 5\' 9"  (1.753 m)   Wt 182 lb (82.6 kg)   SpO2 97%   BMI 26.88 kg/m   Assessment and Plan:  1. Primary hypertension Chronic.  Controlled.  Stable.  Blood pressure today 120/70.  Asymptomatic.  Tolerating medication well.  Continue hydrochlorothiazide 12.5 mg once a day.  Review of previous renal function panel acceptable and we will repeat that likely in 6 months.  Will recheck in 6 months. - hydrochlorothiazide (HYDRODIURIL) 12.5 MG tablet; Take 1 tablet (12.5 mg total) by mouth daily.  Dispense: 90 tablet; Refill: 1  2. Depression, unspecified depression type Chronic.  Exacerbated  by running out of medication.  Patient definitely needs to maintain Remeron if not for  his sleep which he is definitely benefiting from but also it helps with his depression which is currently PHQ of 11 and GAD score of 5.  We will refill the Remeron with refills that will last at least a year because this is probably the most important maintenance for the patient for his disposition. - mirtazapine (REMERON) 15 MG tablet; Take 1 tablet (15 mg total) by mouth at bedtime.  Dispense: 90 tablet; Refill: 3    Elizabeth Sauer, MD

## 2022-08-16 NOTE — Telephone Encounter (Signed)
Noted  KP 

## 2022-08-16 NOTE — Telephone Encounter (Signed)
Reason for Disposition  [1] Depression AND [2] worsening (e.g., sleeping poorly, less able to do activities of daily living)  Answer Assessment - Initial Assessment Questions 1. CONCERN: "What happened that made you call today?"     Patient is requesting medication- he was doing good- but now he is having trouble 2. DEPRESSION SYMPTOM SCREENING: "How are you feeling overall?" (e.g., decreased energy, increased sleeping or difficulty sleeping, difficulty concentrating, feelings of sadness, guilt, hopelessness, or worthlessness)     Decreased energy, can't sleeping, feelings of sadness 3. RISK OF HARM - SUICIDAL IDEATION:  "Do you ever have thoughts of hurting or killing yourself?"  (e.g., yes, no, no but preoccupation with thoughts about death)   - INTENT:  "Do you have thoughts of hurting or killing yourself right NOW?" (e.g., yes, no, N/A)   - PLAN: "Do you have a specific plan for how you would do this?" (e.g., gun, knife, overdose, no plan, N/A)     Patient has been very lonely- patient does not have plans- just goes though his mind 4. RISK OF HARM - HOMICIDAL IDEATION:  "Do you ever have thoughts of hurting or killing someone else?"  (e.g., yes, no, no but preoccupation with thoughts about death)   - INTENT:  "Do you have thoughts of hurting or killing someone right NOW?" (e.g., yes, no, N/A)   - PLAN: "Do you have a specific plan for how you would do this?" (e.g., gun, knife, no plan, N/A)      no 5. FUNCTIONAL IMPAIRMENT: "How have things been going for you overall? Have you had more difficulty than usual doing your normal daily activities?"  (e.g., better, same, worse; self-care, school, work, interactions)     Patient feels that something is missing in his life 6. SUPPORT: "Who is with you now?" "Who do you live with?" "Do you have family or friends who you can talk to?"      Patient lives with his sister- but does not get out of the house 7. THERAPIST: "Do you have a counselor or  therapist? Name?"     no 8. STRESSORS: "Has there been any new stress or recent changes in your life?"     Patient feels alone 9. ALCOHOL USE OR SUBSTANCE USE (DRUG USE): "Do you drink alcohol or use any illegal drugs?"     Patient does drink alcohol-admits he may drink too much 10. OTHER: "Do you have any other physical symptoms right now?" (e.g., fever)       Feet have nerve damage  Protocols used: Depression-A-AH

## 2022-08-16 NOTE — Telephone Encounter (Signed)
  Chief Complaint: depression- tearful Symptoms: depression, patient feels alone, tearful Frequency: chronic Pertinent Negatives: Patient denies suicidal plan- although he states he is so lonely he does think -why is he here - he is tearful Disposition: [] ED /[] Urgent Care (no appt availability in office) / [x] Appointment(In office/virtual)/ []  [facility]/ [] Home Care/ [] Refused Recommended Disposition /[] [facility]/ []  Follow-up with PCP Additional Notes: Appointment has been scheduled for today

## 2022-08-22 ENCOUNTER — Ambulatory Visit: Payer: Medicare Other | Admitting: Family Medicine

## 2022-09-14 DIAGNOSIS — M0579 Rheumatoid arthritis with rheumatoid factor of multiple sites without organ or systems involvement: Secondary | ICD-10-CM | POA: Diagnosis not present

## 2022-09-14 DIAGNOSIS — Z79899 Other long term (current) drug therapy: Secondary | ICD-10-CM | POA: Diagnosis not present

## 2022-09-14 DIAGNOSIS — M170 Bilateral primary osteoarthritis of knee: Secondary | ICD-10-CM | POA: Diagnosis not present

## 2022-09-16 DIAGNOSIS — L2089 Other atopic dermatitis: Secondary | ICD-10-CM | POA: Diagnosis not present

## 2022-09-16 DIAGNOSIS — Z79899 Other long term (current) drug therapy: Secondary | ICD-10-CM | POA: Diagnosis not present

## 2022-11-20 DIAGNOSIS — R4589 Other symptoms and signs involving emotional state: Secondary | ICD-10-CM | POA: Diagnosis not present

## 2022-12-20 ENCOUNTER — Ambulatory Visit: Payer: Medicare Other

## 2022-12-20 DIAGNOSIS — Z0000 Encounter for general adult medical examination without abnormal findings: Secondary | ICD-10-CM | POA: Diagnosis not present

## 2022-12-20 NOTE — Progress Notes (Signed)
Subjective:   Daniel Frye is a 72 y.o. male who presents for Medicare Annual/Subsequent preventive examination.  Visit Complete: Virtual I connected with  [name] on 12/20/22 by a audio enabled telemedicine application and verified that I am speaking with the correct person using two identifiers.  Patient Location: Home  Provider Location: Office/Clinic  I discussed the limitations of evaluation and management by telemedicine. The patient expressed understanding and agreed to proceed.  Vital Signs: Because this visit was a virtual/telehealth visit, some criteria may be missing or patient reported. Any vitals not documented were not able to be obtained and vitals that have been documented are patient reported.  Cardiac Risk Factors include: advanced age (>70men, >65 women);hypertension;male gender;sedentary lifestyle     Objective:    There were no vitals filed for this visit. There is no height or weight on file to calculate BMI.     12/20/2022    2:10 PM 01/01/2021   10:20 AM 12/31/2020   10:20 AM 11/22/2020   10:53 AM 10/09/2019   11:41 AM 09/04/2019    1:28 PM 07/30/2019    8:31 PM  Advanced Directives  Does Patient Have a Medical Advance Directive? No Yes No Yes No No;Yes No  Type of Aeronautical engineer of [employer] Power of [place];Living will   Does patient want to make changes to medical advance directive?  Yes (MAU/Ambulatory/Procedural Areas - Information given)  No - Patient declined     Copy of Healthcare Power of Attorney in Chart?    No - copy requested  No - copy requested   Would patient like information on creating a medical advance directive? No - Patient declined      No - Patient declined    Current Medications (verified) Outpatient Encounter Medications as of 12/20/2022  Medication Sig   Adalimumab (HUMIRA PEN) 40 MG/0.4ML PNKT Inject into the skin.   albuterol (PROAIR HFA) 108 (90 Base) MCG/ACT inhaler INHALE 2 PUFFS BY  MOUTH EVERY 4 HOURS AS NEEDED FOR  WHEEZING  OR  SHORTNESS  OF  BREATH   albuterol (PROVENTIL) (2.5 MG/3ML) 0.083% nebulizer solution Take 3 mLs (2.5 mg total) by nebulization every 6 (six) hours as needed for wheezing or shortness of breath.   clobetasol ointment (TEMOVATE) 0.05 % derm   DUPIXENT 300 MG/2ML SOPN Inject into the skin.   Ensure Plus (ENSURE PLUS) LIQD Take 237 mLs by mouth.   folic acid (FOLVITE) 1 MG tablet Take 1 mg by mouth daily.   gabapentin (NEURONTIN) 100 MG capsule Take 1 capsule (100 mg total) by mouth 2 (two) times daily.   hydrochlorothiazide (HYDRODIURIL) 12.5 MG tablet Take 1 tablet (12.5 mg total) by mouth daily.   methotrexate 2.5 MG tablet Take 8 tablets by mouth once a week.   mirtazapine (REMERON) 15 MG tablet Take 1 tablet (15 mg total) by mouth at bedtime.   TRELEGY ELLIPTA 100-62.5-25 MCG/INH AEPB SMARTSIG:1 Via Inhaler Daily PRN   permethrin (ELIMITE) 5 % cream Apply to all areas of the body from the neck to soles of feet, also apply on the hairline, neck, scalp, temple, and forehead; leave on for 8 to 14 hours before removing by washing (shower or bath) (Patient not taking: Reported on 12/17/2021)   No facility-administered encounter medications on file as of 12/20/2022.    Allergies (verified) Renflexis [infliximab]   History: Past Medical History:  Diagnosis Date   Alcohol abuse    CHF (congestive  heart failure) (HCC)    Cocaine use    COPD (chronic obstructive pulmonary disease) (HCC)    Depression    Diabetes mellitus without complication (HCC)    Hypertension    Marijuana use    Rheumatoid arteritis (HCC) 1989   Took shots for awhile but not now   Rheumatoid arthritis (HCC)    Substance abuse (HCC)    Tobacco use    Past Surgical History:  Procedure Laterality Date   BACK SURGERY     COLONOSCOPY WITH PROPOFOL N/A 12/31/2020   Procedure: COLONOSCOPY WITH PROPOFOL;  Surgeon: Midge Minium, MD;  Location: [facility] ENDOSCOPY;  Service:  Endoscopy;  Laterality: N/A;   heart surgery to drain fluid N/A 03/07/1985   Heart surgery to remove fluid    Family History  Problem Relation Age of Onset   COPD Brother    Arthritis Brother    Diabetes Brother    Hypertension Brother    Social History   Socioeconomic History   Marital status: Single    Spouse name: Not on file   Number of children: 2   Years of education: 8   Highest education level: 9th grade  Occupational History   Occupation: Retired  Tobacco Use   Smoking status: Every Day    Current packs/day: 0.25    Average packs/day: 0.3 packs/day for 51.0 years (12.8 ttl pk-yrs)    Types: Cigarettes, Cigars   Smokeless tobacco: Never   Tobacco comments:    Wants to quit no quit date set  Vaping Use   Vaping status: Never Used  Substance and Sexual Activity   Alcohol use: Yes    Alcohol/week: 49.0 standard drinks of alcohol    Types: 49 Standard drinks or equivalent per week    Comment: Drinks 1/5 day of wine    Drug use: Not Currently    Comment: no drugs in 1 year and 3 months   Sexual activity: Not Currently  Other Topics Concern   Not on file  Social History Narrative   One of his children passed away 8 months ago from 12-18-2020   Social Determinants of Health   Financial Resource Strain: Low Risk  (12/20/2022)   Overall Financial Resource Strain (CARDIA)    Difficulty of Paying Living Expenses: Not hard at all  Food Insecurity: No Food Insecurity (12/20/2022)   Hunger Vital Sign    Worried About Running Out of Food in the Last Year: Never true    Ran Out of Food in the Last Year: Never true  Transportation Needs: No Transportation Needs (12/20/2022)   PRAPARE - Administrator, Civil Service (Medical): No    Lack of Transportation (Non-Medical): No  Physical Activity: Inactive (12/20/2022)   Exercise Vital Sign    Days of Exercise per Week: 0 days    Minutes of Exercise per Session: 0 min  Stress: No Stress Concern Present  (12/20/2022)   [employer] of Occupational Health - Occupational Stress Questionnaire    Feeling of Stress : Not at all  Social Connections: Moderately Isolated (12/20/2022)   Social Connection and Isolation Panel [NHANES]    Frequency of Communication with Friends and Family: Twice a week    Frequency of Social Gatherings with Friends and Family: More than three times a week    Attends Religious Services: 1 to 4 times per year    Active Member of [employer] or Organizations: No    Attends Banker Meetings: Never  Marital Status: Never married    Tobacco Counseling Ready to quit: Not Answered Counseling given: Not Answered Tobacco comments: Wants to quit no quit date set   Clinical Intake:  Pre-visit preparation completed: Yes  Pain : No/denies pain     Nutritional Status: BMI 25 -29 Overweight Nutritional Risks: None Diabetes: No  How often do you need to have someone help you when you read instructions, pamphlets, or other written materials from your doctor or pharmacy?: 1 - Never  Interpreter Needed?: No  Information entered by :: Kennedy Bucker, LPN   Activities of Daily Living    12/20/2022    2:11 PM  In your present state of health, do you have any difficulty performing the following activities:  Hearing? 1  Vision? 0  Difficulty concentrating or making decisions? 0  Walking or climbing stairs? 0  Dressing or bathing? 0  Doing errands, shopping? 0  Preparing Food and eating ? N  Using the Toilet? N  In the past six months, have you accidently leaked urine? N  Do you have problems with loss of bowel control? N  Managing your Medications? N  Managing your Finances? N  Housekeeping or managing your Housekeeping? N    Patient Care Team: Duanne Limerick, MD as PCP - General (Family Medicine) Marcina Millard, MD as Consulting Physician (Cardiology) Dayna Barker, MD as Referring Physician (Rheumatology)  Indicate any recent  Medical Services you may have received from other than [facility] providers in the past year (date may be approximate).     Assessment:   This is a routine wellness examination for Aarian.  Hearing/Vision screen Hearing Screening - Comments:: Wears aids, both ears Vision Screening - Comments:: Readers-    Goals Addressed             This Visit's Progress    DIET - EAT MORE FRUITS AND VEGETABLES         Depression Screen    12/20/2022    2:08 PM 08/16/2022    4:11 PM 02/08/2022   10:21 AM 12/17/2021    8:59 AM 09/27/2021    4:07 PM 08/09/2021   11:16 AM 01/07/2021   10:10 AM  PHQ 2/9 Scores  PHQ - 2 Score 2 4 0 1 4 1 2   PHQ- 9 Score 3 11 0  11 4 7     Fall Risk    12/20/2022    2:11 PM 08/16/2022    4:11 PM 02/08/2022   10:21 AM 12/17/2021    9:00 AM 09/27/2021    4:06 PM  Fall Risk   Falls in the past year? 0 1 0 0 0  Number falls in past yr: 0 1 0 0 0  Injury with Fall? 0 0 0 0 0  Risk for fall due to : No Fall Risks History of fall(s);Impaired balance/gait Impaired balance/gait No Fall Risks Impaired balance/gait  Follow up Falls prevention discussed;Falls evaluation completed Falls evaluation completed;Falls prevention discussed Falls evaluation completed;Falls prevention discussed Falls evaluation completed Falls evaluation completed    MEDICARE RISK AT HOME: Medicare Risk at Home Any stairs in or around the home?: No If so, are there any without handrails?: No Home free of loose throw rugs in walkways, pet beds, electrical cords, etc?: Yes Adequate lighting in your home to reduce risk of falls?: Yes Life alert?: No Use of a cane, walker or w/c?: Yes (cane) Grab bars in the bathroom?: Yes Shower chair or bench in shower?: No Elevated toilet seat or  a handicapped toilet?: No  TIMED UP AND GO:  Was the test performed?  No    Cognitive Function:        12/20/2022    2:12 PM 12/17/2021    9:01 AM 11/22/2020   11:02 AM 09/06/2017   11:52 AM  6CIT Screen  What  Year? 0 points 0 points 0 points 0 points  What month? 0 points 0 points 0 points 0 points  What time? 0 points 0 points 0 points 3 points  Count back from 20 2 points 0 points 0 points 0 points  Months in reverse 4 points 0 points 2 points 4 points  Repeat phrase 2 points 4 points 0 points 2 points  Total Score 8 points 4 points 2 points 9 points    Immunizations Immunization History  Administered Date(s) Administered   Influenza, High Dose Seasonal PF 12/05/2017   Influenza,inj,Quad PF,6+ Mos 11/26/2014   Influenza,inj,quad, With Preservative 12/05/2017   Influenza-Unspecified 12/06/2015, 12/07/2019, 12/05/2020   PPD Test 02/22/2008   Pneumococcal Conjugate-13 09/06/2017    TDAP status: Due, Education has been provided regarding the importance of this vaccine. Advised may receive this vaccine at local pharmacy or Health Dept. Aware to provide a copy of the vaccination record if obtained from local pharmacy or Health Dept. Verbalized acceptance and understanding.  Flu Vaccine status: Declined, Education has been provided regarding the importance of this vaccine but patient still declined. Advised may receive this vaccine at local pharmacy or Health Dept. Aware to provide a copy of the vaccination record if obtained from local pharmacy or Health Dept. Verbalized acceptance and understanding.  Pneumococcal vaccine status: Up to date  Covid-19 vaccine status: Declined, Education has been provided regarding the importance of this vaccine but patient still declined. Advised may receive this vaccine at local pharmacy or Health Dept.or vaccine clinic. Aware to provide a copy of the vaccination record if obtained from local pharmacy or Health Dept. Verbalized acceptance and understanding.  Qualifies for Shingles Vaccine? Yes   Zostavax completed No   Shingrix Completed?: No.    Education has been provided regarding the importance of this vaccine. Patient has been advised to call insurance  company to determine out of pocket expense if they have not yet received this vaccine. Advised may also receive vaccine at local pharmacy or Health Dept. Verbalized acceptance and understanding.  Screening Tests Health Maintenance  Topic Date Due   Diabetic kidney evaluation - Urine ACR  Never done   Zoster Vaccines- Shingrix (1 of 2) Never done   HEMOGLOBIN A1C  02/08/2022   Diabetic kidney evaluation - eGFR measurement  08/10/2022   FOOT EXAM  08/10/2022   INFLUENZA VACCINE  06/05/2023 (Originally 10/06/2022)   Pneumonia Vaccine 38+ Years old (2 of 2 - PPSV23 or PCV20) 08/16/2023 (Originally 11/01/2017)   OPHTHALMOLOGY EXAM  04/06/2023   Medicare Annual Wellness (AWV)  12/20/2023   Colonoscopy  01/01/2031   Hepatitis C Screening  Completed   HPV VACCINES  Aged Out   DTaP/Tdap/Td  Discontinued   COVID-19 Vaccine  Discontinued    Health Maintenance  Health Maintenance Due  Topic Date Due   Diabetic kidney evaluation - Urine ACR  Never done   Zoster Vaccines- Shingrix (1 of 2) Never done   HEMOGLOBIN A1C  02/08/2022   Diabetic kidney evaluation - eGFR measurement  08/10/2022   FOOT EXAM  08/10/2022    Colorectal cancer screening: Type of screening: Colonoscopy. Completed 12/31/20. Repeat every 10 years  Lung Cancer Screening: (Low Dose CT Chest recommended if Age 86-80 years, 20 pack-year currently smoking OR have quit w/in 15years.) does not qualify.     Additional Screening:  Hepatitis C Screening: does qualify; Completed 09/08/17  Vision Screening: Recommended annual ophthalmology exams for early detection of glaucoma and other disorders of the eye. Is the patient up to date with their annual eye exam?  No  Who is the provider or what is the name of the office in which the patient attends annual eye exams? No one If pt is not established with a provider, would they like to be referred to a provider to establish care? No .   Dental Screening: Recommended annual dental exams  for proper oral hygiene   Community Resource Referral / Chronic Care Management: CRR required this visit?  No   CCM required this visit?  No     Plan:     I have personally reviewed and noted the following in the patient's chart:   Medical and social history Use of alcohol, tobacco or illicit drugs  Current medications and supplements including opioid prescriptions. Patient is currently taking opioid prescriptions. Information provided to patient regarding non-opioid alternatives. Patient advised to discuss non-opioid treatment plan with their provider. Functional ability and status Nutritional status Physical activity Advanced directives List of other physicians Hospitalizations, surgeries, and ER visits in previous 12 months Vitals Screenings to include cognitive, depression, and falls Referrals and appointments  In addition, I have reviewed and discussed with patient certain preventive protocols, quality metrics, and best practice recommendations. A written personalized care plan for preventive services as well as general preventive health recommendations were provided to patient.     Hal Hope, LPN   [id number]   After Visit Summary: (MyChart) Due to this being a telephonic visit, the after visit summary with patients personalized plan was offered to patient via MyChart   Nurse Notes: none

## 2022-12-20 NOTE — Patient Instructions (Addendum)
Daniel Frye , Thank you for taking time to come for your Medicare Wellness Visit. I appreciate your ongoing commitment to your health goals. Please review the following plan we discussed and let me know if I can assist you in the future.   Referrals/Orders/Follow-Ups/Clinician Recommendations: none  This is a list of the screening recommended for you and due dates:  Health Maintenance  Topic Date Due   Yearly kidney health urinalysis for diabetes  Never done   Zoster (Shingles) Vaccine (1 of 2) Never done   Hemoglobin A1C  02/08/2022   Yearly kidney function blood test for diabetes  08/10/2022   Complete foot exam   08/10/2022   Flu Shot  06/05/2023*   Pneumonia Vaccine (2 of 2 - PPSV23 or PCV20) 08/16/2023*   Eye exam for diabetics  04/06/2023   Medicare Annual Wellness Visit  12/20/2023   Colon Cancer Screening  01/01/2031   Hepatitis C Screening  Completed   HPV Vaccine  Aged Out   DTaP/Tdap/Td vaccine  Discontinued   COVID-19 Vaccine  Discontinued  *Topic was postponed. The date shown is not the original due date.    Advanced directives: (ACP Link)Information on Advanced Care Planning can be found at [facility] Directives Advance Health Care Directives ([email or web address]   Next Medicare Annual Wellness Visit scheduled for next year: Yes   12/26/23 @ 2:20 pm by video

## 2023-01-13 DIAGNOSIS — Z008 Encounter for other general examination: Secondary | ICD-10-CM | POA: Diagnosis not present

## 2023-02-16 ENCOUNTER — Ambulatory Visit (INDEPENDENT_AMBULATORY_CARE_PROVIDER_SITE_OTHER): Payer: Medicare Other | Admitting: Family Medicine

## 2023-02-16 ENCOUNTER — Encounter: Payer: Self-pay | Admitting: Family Medicine

## 2023-02-16 VITALS — BP 132/80 | HR 90 | Ht 69.0 in | Wt 179.8 lb

## 2023-02-16 DIAGNOSIS — R7303 Prediabetes: Secondary | ICD-10-CM

## 2023-02-16 DIAGNOSIS — J441 Chronic obstructive pulmonary disease with (acute) exacerbation: Secondary | ICD-10-CM

## 2023-02-16 DIAGNOSIS — I10 Essential (primary) hypertension: Secondary | ICD-10-CM | POA: Diagnosis not present

## 2023-02-16 MED ORDER — HYDROCHLOROTHIAZIDE 12.5 MG PO TABS
12.5000 mg | ORAL_TABLET | Freq: Every day | ORAL | 1 refills | Status: DC
Start: 2023-02-16 — End: 2023-04-17

## 2023-02-16 MED ORDER — ALBUTEROL SULFATE HFA 108 (90 BASE) MCG/ACT IN AERS: INHALATION_SPRAY | RESPIRATORY_TRACT | 0 refills | Status: DC

## 2023-02-16 NOTE — Progress Notes (Signed)
Date:  02/16/2023   Name:  Daniel Frye   DOB:  Jul 27, 1950   MRN:  [id number]   Chief Complaint: Hypertension (Patient here for follow up on B/P. His b/p is high today, but he is feeling well. )  Hypertension This is a chronic problem. The current episode started more than 1 year ago. The problem has been gradually improving since onset. Pertinent negatives include no anxiety, blurred vision, chest pain, headaches, malaise/fatigue, neck pain, orthopnea, palpitations, peripheral edema, PND, shortness of breath or sweats. There are no associated agents to hypertension. There are no known risk factors for coronary artery disease.  Asthma There is no chest tightness, cough, difficulty breathing, frequent throat clearing, hemoptysis, hoarse voice, shortness of breath or wheezing. Primary symptoms comments: For COPD. This is a chronic problem. The problem occurs intermittently. The problem has been gradually improving. Associated symptoms include dyspnea on exertion. Pertinent negatives include no appetite change, chest pain, headaches, malaise/fatigue, orthopnea, PND, sneezing, sweats or weight loss. His past medical history is significant for asthma.  Diabetes He presents for his follow-up (for prediab) diabetic visit. His disease course has been stable. Pertinent negatives for hypoglycemia include no confusion, headaches, sleepiness or sweats. Pertinent negatives for diabetes include no blurred vision, no chest pain, no polydipsia, no polyuria and no weight loss. Symptoms are stable.    Lab Results  Component Value Date   NA 131 (L) 08/09/2021   K 5.5 (H) 08/09/2021   CO2 19 (L) 08/09/2021   GLUCOSE 95 08/09/2021   BUN 32 (H) 08/09/2021   CREATININE 1.96 (H) 08/09/2021   CALCIUM 9.6 08/09/2021   EGFR 35 (L) 08/09/2021   GFRNONAA >60 10/09/2019   No results found for: "CHOL", "HDL", "LDLCALC", "LDLDIRECT", "TRIG", "CHOLHDL" Lab Results  Component Value Date   TSH 0.476 08/25/2020    Lab Results  Component Value Date   HGBA1C 5.8 (H) 08/09/2021   Lab Results  Component Value Date   WBC 3.4 08/25/2020   HGB 12.6 (L) 08/25/2020   HCT 37.3 (L) 08/25/2020   MCV 96 08/25/2020   PLT 73 (LL) 08/25/2020   Lab Results  Component Value Date   ALT 20 08/09/2021   AST 21 08/09/2021   ALKPHOS 114 08/09/2021   BILITOT 0.3 08/09/2021   Lab Results  Component Value Date   VD25OH 11.1 (L) 01/26/2015     Review of Systems  Constitutional:  Negative for appetite change, malaise/fatigue and weight loss.  HENT:  Negative for hoarse voice and sneezing.   Eyes:  Negative for blurred vision.  Respiratory:  Negative for cough, hemoptysis, choking, chest tightness, shortness of breath and wheezing.   Cardiovascular:  Positive for dyspnea on exertion. Negative for chest pain, palpitations, orthopnea, leg swelling and PND.  Endocrine: Negative for polydipsia and polyuria.  Genitourinary:  Negative for difficulty urinating.  Musculoskeletal:  Negative for neck pain.  Neurological:  Negative for headaches.  Psychiatric/Behavioral:  Negative for confusion.     Patient Active Problem List   Diagnosis Date Noted   Polyp of transverse colon    Chronic pain syndrome 01/14/2020   Pharmacologic therapy 01/14/2020   Disorder of skeletal system 01/14/2020   Problems influencing health status 01/14/2020   History of substance use disorder 01/14/2020   Abnormal drug screens 01/14/2020   History of alcohol abuse 01/14/2020   History of cocaine use 01/14/2020   History of marijuana use 01/14/2020   History of tobacco abuse 01/14/2020   Acute anaphylaxis 07/31/2019  Acute respiratory failure with hypoxia (HCC) 07/30/2019   DDD (degenerative disc disease), lumbar 01/07/2019   Cervical spondylosis with myelopathy 08/01/2018   Neck pain 05/30/2018   Degenerative cervical spinal stenosis 05/29/2018   Aortic atherosclerosis (HCC) 05/15/2018   Depression 04/01/2018   Numbness and  tingling in both hands 03/13/2018   Encounter for long-term (current) use of high-risk medication 08/09/2017   Primary osteoarthritis of both knees 08/09/2017   Hypokalemia 03/03/2016   Hypomagnesemia 03/03/2016   Substance induced mood disorder (HCC) 03/02/2016   Alcohol withdrawal (HCC) 02/29/2016   Crack cocaine use 01/22/2016   Obstructive sleep apnea of adult 03/30/2015   Edema of foot 03/30/2015   Chronic diastolic CHF (congestive heart failure), NYHA class 2 (HCC) 03/30/2015   Apnea, sleep 03/30/2015   Alcohol abuse 03/23/2015   Noncompliance 03/23/2015   Vitamin D deficiency 01/27/2015   Diabetes mellitus type 2, controlled, without complications (HCC) 12/03/2014   Obesity (BMI 30.0-34.9) 11/26/2014   COPD (chronic obstructive pulmonary disease) (HCC) 06/22/2014   Essential (primary) hypertension 06/22/2014   Rheumatoid arthritis involving multiple sites (HCC) 06/22/2014   Tobacco abuse 06/22/2014   Chronic obstructive pulmonary disease with acute exacerbation (HCC) 06/22/2014   History of drug abuse (HCC) 03/05/2009   SOB (shortness of breath) on exertion 03/05/2009   Alcohol abuse, daily use 03/05/2009   Cocaine dependence, abuse (HCC) 03/05/2009   Fever 03/05/2009   Wheezing 03/05/2009    Allergies  Allergen Reactions   Renflexis [Infliximab] Anaphylaxis    Past Surgical History:  Procedure Laterality Date   BACK SURGERY     COLONOSCOPY WITH PROPOFOL N/A 12/31/2020   Procedure: COLONOSCOPY WITH PROPOFOL;  Surgeon: Midge Minium, MD;  Location: [facility] END[facility]COPY;  Service: Endoscopy;  Laterality: N/A;   heart surgery to drain fluid N/A 03/07/1985   Heart surgery to remove fluid     Social History   Tobacco Use   Smoking status: Every Day    Current packs/day: 0.25    Average packs/day: 0.3 packs/day for 51.0 years (12.8 ttl pk-yrs)    Types: Cigarettes, Cigars   Smokeless tobacco: Never   Tobacco comments:    Wants to quit no quit date set  Vaping Use    Vaping status: Never Used  Substance Use Topics   Alcohol use: Yes    Alcohol/week: 49.0 standard drinks of alcohol    Types: 49 Standard drinks or equivalent per week    Comment: Drinks 1/5 day of wine    Drug use: Not Currently    Comment: no drugs in 1 year and 3 months     Medication list has been reviewed and updated.  Current Meds  Medication Sig   albuterol (PROAIR HFA) 108 (90 Base) MCG/ACT inhaler INHALE 2 PUFFS BY MOUTH EVERY 4 HOURS AS NEEDED FOR  WHEEZING  OR  SHORTNESS  OF  BREATH   albuterol (PROVENTIL) (2.5 MG/3ML) 0.083% nebulizer solution Take 3 mLs (2.5 mg total) by nebulization every 6 (six) hours as needed for wheezing or shortness of breath.   clobetasol ointment (TEMOVATE) 0.05 % derm   Ensure Plus (ENSURE PLUS) LIQD Take 237 mLs by mouth.       02/16/2023    1:27 PM 08/16/2022    4:13 PM 02/08/2022   10:21 AM 09/27/2021    4:07 PM  GAD 7 : Generalized Anxiety Score  Nervous, Anxious, on Edge 0 1 0 1  Control/stop worrying 0 0 0 1  Worry too much - different things 1  0 0 1  Trouble relaxing 1 2 0 0  Restless 0 1 0 0  Easily annoyed or irritable 1 0 0 0  Afraid - awful might happen 0 1 0 0  Total GAD 7 Score 3 5 0 3  Anxiety Difficulty Not difficult at all Somewhat difficult Not difficult at all Extremely difficult       02/16/2023    1:26 PM 12/20/2022    2:08 PM 08/16/2022    4:11 PM  Depression screen PHQ 2/9  Decreased Interest 0 1 1  Down, Depressed, Hopeless 1 1 3   PHQ - 2 Score 1 2 4   Altered sleeping 1 0 2  Tired, decreased energy  1 2  Change in appetite 0 0 0  Feeling bad or failure about yourself  1 0 1  Trouble concentrating 0 0 0  Moving slowly or fidgety/restless 0 0 2  Suicidal thoughts  0 0  PHQ-9 Score 3 3 11   Difficult doing work/chores Not difficult at all Not difficult at all Somewhat difficult    BP Readings from Last 3 Encounters:  02/16/23 (!) 144/92  08/16/22 120/70  02/08/22 100/78    Physical Exam Vitals and  nursing note reviewed.  HENT:     Head: Normocephalic.     Right Ear: Tympanic membrane and external ear normal.     Left Ear: Tympanic membrane and external ear normal.     Nose: Nose normal.  Eyes:     General: No scleral icterus.       Right eye: No discharge.        Left eye: No discharge.     Conjunctiva/sclera: Conjunctivae normal.     Pupils: Pupils are equal, round, and reactive to light.  Neck:     Thyroid: No thyromegaly.     Vascular: No JVD.     Trachea: No tracheal deviation.  Cardiovascular:     Rate and Rhythm: Normal rate and regular rhythm.     Heart sounds: Normal heart sounds. No murmur heard.    No friction rub. No gallop.  Pulmonary:     Effort: No respiratory distress.     Breath sounds: Normal breath sounds. No decreased breath sounds, wheezing, rhonchi or rales.  Abdominal:     General: Bowel sounds are normal.     Palpations: Abdomen is soft. There is no hepatomegaly, splenomegaly or mass.     Tenderness: There is no abdominal tenderness. There is no guarding or rebound.  Musculoskeletal:        General: No tenderness. Normal range of motion.     Cervical back: Normal range of motion and neck supple.  Lymphadenopathy:     Cervical: No cervical adenopathy.  Skin:    General: Skin is warm.     Findings: No rash.  Neurological:     Mental Status: He is alert.     Deep Tendon Reflexes: Reflexes are normal and symmetric.     Wt Readings from Last 3 Encounters:  02/16/23 179 lb 12.8 oz (81.6 kg)  08/16/22 182 lb (82.6 kg)  02/08/22 174 lb (78.9 kg)    BP (!) 144/92   Pulse 90   Ht 5\' 9"  (1.753 m)   Wt 179 lb 12.8 oz (81.6 kg)   SpO2 100%   BMI 26.55 kg/m   Assessment and Plan: 1. Primary hypertension (Primary) Chronic.  Controlled.  Stable.  Blood pressure today is 132/80.  Asymptomatic.  Tolerating medication well.  Will continue hydrochlorothiazide  12.5 mg once a day.  Will check renal function panel for GFR and electrolytes.  Will recheck  patient in 6 months. - hydrochlorothiazide (HYDRODIURIL) 12.5 MG tablet; Take 1 tablet (12.5 mg total) by mouth daily.  Dispense: 90 tablet; Refill: 1 - Renal Function Panel  2. Prediabetes Chronic.  Controlled.  Asymptomatic.  No polyuria or polydipsia.  Patient is controlling with dietary approach watching concentrated sweets and limiting carbohydrate intake.  Will check A1c for current level of control. - Hemoglobin A1c  3. Chronic obstructive pulmonary disease with acute exacerbation (HCC) .  Controlled.  Stable.  Patient is controlled with albuterol both nebulization which she has at home and albuterol inhaler which you would like refilled.  We will refill and encouraged to take 1 to 2 puffs every 6 hours as needed dyspnea.  We will recheck patient in 6 months. - albuterol (PROAIR HFA) 108 (90 Base) MCG/ACT inhaler; INHALE 2 PUFFS BY MOUTH EVERY 4 HOURS AS NEEDED FOR  WHEEZING  OR  SHORTNESS  OF  BREATH  Dispense: 27 g; Refill: 0     Elizabeth Sauer, MD

## 2023-02-17 ENCOUNTER — Encounter: Payer: Self-pay | Admitting: Family Medicine

## 2023-02-17 LAB — RENAL FUNCTION PANEL
Albumin: 4.4 g/dL (ref 3.8–4.8)
BUN/Creatinine Ratio: 14 (ref 10–24)
BUN: 22 mg/dL (ref 8–27)
CO2: 20 mmol/L (ref 20–29)
Calcium: 9.5 mg/dL (ref 8.6–10.2)
Chloride: 97 mmol/L (ref 96–106)
Creatinine, Ser: 1.54 mg/dL — ABNORMAL HIGH (ref 0.76–1.27)
Glucose: 97 mg/dL (ref 70–99)
Phosphorus: 2.8 mg/dL (ref 2.8–4.1)
Potassium: 4.5 mmol/L (ref 3.5–5.2)
Sodium: 135 mmol/L (ref 134–144)
eGFR: 47 mL/min/{1.73_m2} — ABNORMAL LOW (ref >59)

## 2023-02-17 LAB — HEMOGLOBIN A1C
Est. average glucose Bld gHb Est-mCnc: 111 mg/dL
Hgb A1c MFr Bld: 5.5 % (ref 4.8–5.6)

## 2023-02-21 ENCOUNTER — Other Ambulatory Visit: Payer: Self-pay | Admitting: Family Medicine

## 2023-02-21 DIAGNOSIS — F32A Depression, unspecified: Secondary | ICD-10-CM

## 2023-02-21 NOTE — Telephone Encounter (Signed)
Medication Refill -  Most Recent Primary Care Visit:  Provider: Duanne Limerick  Department: [facility]  Visit Type: OFFICE VISIT  Date: 02/16/2023  Medication: mirtazapine (REMERON) 15 MG tablet All other prescriptions as well.   Pt requesting all prescriptions to be transferred over to [facility]. Pt has had a chang in insurance, pt received call to have prescriptions transferred over. Pt stated all his medications needed to be transferred, pt unsure of all the names but stated the ones for his arthritis as well.   Has the patient contacted their pharmacy? Yes (Agent: If yes, when and what did the pharmacy advise?)  Is this the correct pharmacy for this prescription? Yes If no, delete pharmacy and type the correct one.  This is the patient's preferred pharmacy:  [place] - [address] [facility] [address] [place] [address] Phone: [phone] Fax: [phone]   Has the prescription been filled recently? Yes  Is the patient out of the medication? No  Has the patient been seen for an appointment in the last year OR does the patient have an upcoming appointment? Yes  Can we respond through MyChart? Yes  Agent: Please be advised that Rx refills may take up to 3 business days. We ask that you follow-up with your pharmacy.

## 2023-02-22 NOTE — Telephone Encounter (Signed)
Refused Remeron 15 mg because it was discontinued 02/16/2023.

## 2023-02-27 ENCOUNTER — Ambulatory Visit: Payer: Self-pay

## 2023-02-27 NOTE — Telephone Encounter (Signed)
Called and left patient a VM to call back. Informed him he needs to call back and speak with me. Patient needs to go the ER for suicidal thoughts. We cannot prescribe medications without discussing this and this was not discussed at recent visit. His recent scores PHQ9, and GAD7 was only 3 for both so he did not discuss this with Dr Yetta Barre.  - [name]

## 2023-02-27 NOTE — Telephone Encounter (Signed)
  Chief Complaint: depression/wanting to get back on meds for anxiety and depression Symptoms: crying, loneliness, suicidal ideation: " sometimes I wish I would fall asleep and not wake up" Frequency: several months Pertinent Negatives: Patient denies suicidal plan or homicidal plan  Disposition: [] ED /[] Urgent Care (no appt availability in office) / [] Appointment(In office/virtual)/ []  [facility]/ [] Home Care/ [] Refused Recommended Disposition /[]  Mobile Bus/ []  Follow-up with PCP Additional Notes: Called office d/t no appts= advised that nurse will call pt. Pt asking for prescription to be called in. Pt stated that he was just in and thought med was going to be ordered. Advised that issue was not documented and will need appt.  Reason for Disposition  Sometimes has thoughts of suicide  Answer Assessment - Initial Assessment Questions 1. CONCERN: "What happened that made you call today?"     Tired feeling this way, focus, crying   2. DEPRESSION SYMPTOM SCREENING: "How are you feeling overall?" (e.g., decreased energy, increased sleeping or difficulty sleeping, difficulty concentrating, feelings of sadness, guilt, hopelessness, or worthlessness)     Lonely depressed, self isolation 3. RISK OF HARM - SUICIDAL IDEATION:  "Do you ever have thoughts of hurting or killing yourself?"  (e.g., yes, no, no but preoccupation with thoughts about death)   - INTENT:  "Do you have thoughts of hurting or killing yourself right NOW?" (e.g., yes, no, N/A)   - PLAN: "Do you have a specific plan for how you would do this?" (e.g., gun, knife, overdose, no plan, N/A)     Yes  4. RISK OF HARM - HOMICIDAL IDEATION:  "Do you ever have thoughts of hurting or killing someone else?"  (e.g., yes, no, no but preoccupation with thoughts about death)   - INTENT:  "Do you have thoughts of hurting or killing someone right NOW?" (e.g., yes, no, N/A)   - PLAN: "Do you have a specific plan for how you would  do this?" (e.g., gun, knife, no plan, N/A)      no 5. FUNCTIONAL IMPAIRMENT: "How have things been going for you overall? Have you had more difficulty than usual doing your normal daily activities?"  (e.g., better, same, worse; self-care, school, work, interactions)     Worse, self limiting 6. SUPPORT: "Who is with you now?" "Who do you live with?" "Do you have family or friends who you can talk to?"      sister 7. THERAPIST: "Do you have a counselor or therapist? Name?"     no 8. STRESSORS: "Has there been any new stress or recent changes in your life?"     Physical problems  9. ALCOHOL USE OR SUBSTANCE USE (DRUG USE): "Do you drink alcohol or use any illegal drugs?"     Alcohol, smoking  10. OTHER: "Do you have any other physical symptoms right now?" (e.g., fever)       Gets comfort discomfort  11. PREGNANCY: "Is there any chance you are pregnant?" "When was your last menstrual period?"       N/a  Protocols used: Depression-A-AH

## 2023-02-27 NOTE — Telephone Encounter (Signed)
Summary: Depression; home health nurse called, no longer with the patient   Lorre Nick RN called reporting that the patient has been experiencing symptoms of depression, she was not with the patient but called to get him an appt.            Pt. Triaged earlier today and told to go to ED per the practice.

## 2023-03-02 ENCOUNTER — Encounter: Payer: Self-pay | Admitting: Family Medicine

## 2023-03-02 ENCOUNTER — Ambulatory Visit (INDEPENDENT_AMBULATORY_CARE_PROVIDER_SITE_OTHER): Payer: Medicare Other | Admitting: Family Medicine

## 2023-03-02 VITALS — BP 122/78 | HR 92 | Ht 69.0 in | Wt 178.0 lb

## 2023-03-02 DIAGNOSIS — F419 Anxiety disorder, unspecified: Secondary | ICD-10-CM | POA: Diagnosis not present

## 2023-03-02 DIAGNOSIS — R519 Headache, unspecified: Secondary | ICD-10-CM

## 2023-03-02 DIAGNOSIS — R200 Anesthesia of skin: Secondary | ICD-10-CM

## 2023-03-02 DIAGNOSIS — R531 Weakness: Secondary | ICD-10-CM

## 2023-03-02 DIAGNOSIS — F32A Depression, unspecified: Secondary | ICD-10-CM

## 2023-03-02 DIAGNOSIS — R202 Paresthesia of skin: Secondary | ICD-10-CM

## 2023-03-02 DIAGNOSIS — Z91199 Patient's noncompliance with other medical treatment and regimen due to unspecified reason: Secondary | ICD-10-CM

## 2023-03-02 NOTE — Progress Notes (Signed)
Date:  03/02/2023   Name:  Daniel Frye   DOB:  1950-06-12   MRN:  [id number]   Chief Complaint: Depression (Patient feels lonely. Doesn't have many neighbors to talk to. Patient said sometimes he feels like he wants to fall asleep and not wake up but does not want to harm himself. He did say his wife passed 6 years ago and then his two sons passed away two years ago.)  Depression        This is a recurrent problem.  The current episode started more than 1 year ago.   The onset quality is undetermined.   The problem occurs intermittently.  The problem has been waxing and waning since onset.  Associated symptoms include decreased concentration, fatigue, helplessness, hopelessness, insomnia, irritable, restlessness, decreased interest, headaches and sad.  Associated symptoms include no indigestion and no suicidal ideas.     The symptoms are aggravated by family issues (passing of family members).  Past treatments include SSRIs - Selective serotonin reuptake inhibitors.  Past compliance problems include difficulty with treatment plan. Headache  Chronicity: "sensation in head"/ different this time. The problem occurs constantly. The problem has been waxing and waning. The pain is located in the Bilateral (generalized) region. The pain quality is not similar to prior headaches. Quality: pressure inside. Associated symptoms include dizziness, insomnia, a loss of balance and weakness. Pertinent negatives include no blurred vision, eye pain, eye watering, phonophobia, photophobia, rhinorrhea, visual change or vomiting. Associated symptoms comments: Sensory loss comes and goes..    Lab Results  Component Value Date   NA 135 02/16/2023   K 4.5 02/16/2023   CO2 20 02/16/2023   GLUCOSE 97 02/16/2023   BUN 22 02/16/2023   CREATININE 1.54 (H) 02/16/2023   CALCIUM 9.5 02/16/2023   EGFR 47 (L) 02/16/2023   GFRNONAA >60 10/09/2019   No results found for: "CHOL", "HDL", "LDLCALC", "LDLDIRECT", "TRIG",  "CHOLHDL" Lab Results  Component Value Date   TSH 0.476 08/25/2020   Lab Results  Component Value Date   HGBA1C 5.5 02/16/2023   Lab Results  Component Value Date   WBC 3.4 08/25/2020   HGB 12.6 (L) 08/25/2020   HCT 37.3 (L) 08/25/2020   MCV 96 08/25/2020   PLT 73 (LL) 08/25/2020   Lab Results  Component Value Date   ALT 20 08/09/2021   AST 21 08/09/2021   ALKPHOS 114 08/09/2021   BILITOT 0.3 08/09/2021   Lab Results  Component Value Date   VD25OH 11.1 (L) 01/26/2015     Review of Systems  Constitutional:  Positive for fatigue.  HENT:  Negative for rhinorrhea.   Eyes:  Negative for blurred vision, photophobia and pain.  Gastrointestinal:  Negative for vomiting.  Neurological:  Positive for dizziness, weakness, headaches and loss of balance.  Psychiatric/Behavioral:  Positive for decreased concentration and depression. Negative for suicidal ideas. The patient has insomnia.     Patient Active Problem List   Diagnosis Date Noted   Polyp of transverse colon    Chronic pain syndrome 01/14/2020   Pharmacologic therapy 01/14/2020   Disorder of skeletal system 01/14/2020   Problems influencing health status 01/14/2020   History of substance use disorder 01/14/2020   Abnormal drug screens 01/14/2020   History of alcohol abuse 01/14/2020   History of cocaine use 01/14/2020   History of marijuana use 01/14/2020   History of tobacco abuse 01/14/2020   Acute anaphylaxis 07/31/2019   Acute respiratory failure with hypoxia (HCC) 07/30/2019  DDD (degenerative disc disease), lumbar 01/07/2019   Cervical spondylosis with myelopathy 08/01/2018   Neck pain 05/30/2018   Degenerative cervical spinal stenosis 05/29/2018   Aortic atherosclerosis (HCC) 05/15/2018   Depression 04/01/2018   Numbness and tingling in both hands 03/13/2018   Encounter for long-term (current) use of high-risk medication 08/09/2017   Primary osteoarthritis of both knees 08/09/2017   Hypokalemia  03/03/2016   Hypomagnesemia 03/03/2016   Substance induced mood disorder (HCC) 03/02/2016   Alcohol withdrawal (HCC) 02/29/2016   Crack cocaine use 01/22/2016   Obstructive sleep apnea of adult 03/30/2015   Edema of foot 03/30/2015   Chronic diastolic CHF (congestive heart failure), NYHA class 2 (HCC) 03/30/2015   Apnea, sleep 03/30/2015   Alcohol abuse 03/23/2015   Noncompliance 03/23/2015   Vitamin D deficiency 01/27/2015   Diabetes mellitus type 2, controlled, without complications (HCC) 12/03/2014   Obesity (BMI 30.0-34.9) 11/26/2014   COPD (chronic obstructive pulmonary disease) (HCC) 06/22/2014   Essential (primary) hypertension 06/22/2014   Rheumatoid arthritis involving multiple sites (HCC) 06/22/2014   Tobacco abuse 06/22/2014   Chronic obstructive pulmonary disease with acute exacerbation (HCC) 06/22/2014   History of drug abuse (HCC) 03/05/2009   SOB (shortness of breath) on exertion 03/05/2009   Alcohol abuse, daily use 03/05/2009   Cocaine dependence, abuse (HCC) 03/05/2009   Fever 03/05/2009   Wheezing 03/05/2009    Allergies  Allergen Reactions   Renflexis [Infliximab] Anaphylaxis    Past Surgical History:  Procedure Laterality Date   BACK SURGERY     COLONOSCOPY WITH PROPOFOL N/A 12/31/2020   Procedure: COLONOSCOPY WITH PROPOFOL;  Surgeon: Midge Minium, MD;  Location: [facility] ENDOSCOPY;  Service: Endoscopy;  Laterality: N/A;   heart surgery to drain fluid N/A 03/07/1985   Heart surgery to remove fluid     Social History   Tobacco Use   Smoking status: Every Day    Current packs/day: 0.25    Average packs/day: 0.3 packs/day for 51.0 years (12.8 ttl pk-yrs)    Types: Cigarettes, Cigars   Smokeless tobacco: Never   Tobacco comments:    Wants to quit no quit date set  Vaping Use   Vaping status: Never Used  Substance Use Topics   Alcohol use: Yes    Alcohol/week: 49.0 standard drinks of alcohol    Types: 49 Standard drinks or equivalent per week     Comment: Drinks 1/5 day of wine    Drug use: Not Currently    Comment: no drugs in 1 year and 3 months     Medication list has been reviewed and updated.  Current Meds  Medication Sig   albuterol (PROAIR HFA) 108 (90 Base) MCG/ACT inhaler INHALE 2 PUFFS BY MOUTH EVERY 4 HOURS AS NEEDED FOR  WHEEZING  OR  SHORTNESS  OF  BREATH   clobetasol ointment (TEMOVATE) 0.05 % derm   Ensure Plus (ENSURE PLUS) LIQD Take 237 mLs by mouth.   hydrochlorothiazide (HYDRODIURIL) 12.5 MG tablet Take 1 tablet (12.5 mg total) by mouth daily.       03/02/2023    1:57 PM 02/16/2023    1:27 PM 08/16/2022    4:13 PM 02/08/2022   10:21 AM  GAD 7 : Generalized Anxiety Score  Nervous, Anxious, on Edge 3 0 1 0  Control/stop worrying 3 0 0 0  Worry too much - different things 3 1 0 0  Trouble relaxing 3 1 2  0  Restless 3 0 1 0  Easily annoyed or irritable 3  1 0 0  Afraid - awful might happen 3 0 1 0  Total GAD 7 Score 21 3 5  0  Anxiety Difficulty Extremely difficult Not difficult at all Somewhat difficult Not difficult at all       03/02/2023    1:56 PM 02/16/2023    1:26 PM 12/20/2022    2:08 PM  Depression screen PHQ 2/9  Decreased Interest 1 0 1  Down, Depressed, Hopeless 3 1 1   PHQ - 2 Score 4 1 2   Altered sleeping 3 1 0  Tired, decreased energy 3  1  Change in appetite 3 0 0  Feeling bad or failure about yourself  3 1 0  Trouble concentrating 3 0 0  Moving slowly or fidgety/restless 3 0 0  Suicidal thoughts 3  0  PHQ-9 Score 25 3 3   Difficult doing work/chores Extremely dIfficult Not difficult at all Not difficult at all    BP Readings from Last 3 Encounters:  03/02/23 122/78  02/16/23 132/80  08/16/22 120/70    Physical Exam Constitutional:      General: He is irritable.  Cardiovascular:     Rate and Rhythm: Normal rate and regular rhythm.     Heart sounds: S1 normal and S2 normal. No murmur heard.    No systolic murmur is present.     No diastolic murmur is present.     No  friction rub. No gallop. No S3 or S4 sounds.  Pulmonary:     Breath sounds: No decreased breath sounds, wheezing, rhonchi or rales.  Musculoskeletal:     Right lower leg: No edema.  Neurological:     Cranial Nerves: Cranial nerves 2-12 are intact.     Sensory: Sensation is intact.     Motor: Motor function is intact.     Wt Readings from Last 3 Encounters:  03/02/23 178 lb (80.7 kg)  02/16/23 179 lb 12.8 oz (81.6 kg)  08/16/22 182 lb (82.6 kg)    BP 122/78   Pulse 92   Ht 5\' 9"  (1.753 m)   Wt 178 lb (80.7 kg)   SpO2 97%   BMI 26.29 kg/m   Assessment and Plan: 1. Pressure in head (Primary) 1 of several problems that patient is having issues with today.  This is different and that patient is having this sensation which she describes as pressure in the head which is constant in nature and waxes and wanes in intensity.  There has been associated with this a change in his sensation as well as motor weakness and multiple areas.  Patient has had nausea but no vomiting and there has been no change in his baseline cognitive state.  Patient has been requested to go to the emergency room as that this may suggest strokelike activity and may need to have further evaluation with imaging before we can progress in dealing with his anxiety and depression.  2. Anxiety and depression Chronic.  Uncontrolled.  Relatively unstable and that the PHQ is 25 and GAD is 21 this is a change in the patient's level of anxiety and depression for which he used to take Celexa.  However when he runs out then he does not bother to come back and patient has been instructed in the past and given enough for 6 weeks with 60 tablets given for a 7-month period and asked to return for [place] to reevaluate during the taking of the medicine to see if it is efficacious and that what dosing that we  are going to eventually gravitate to. - Ambulatory referral to Psychiatry  3. Numbness and tingling in both hands Patient also has  noticed numbness and tingling in both hands and arms which ever a focal sensation with may be suggesting of strokelike activity.  4. Weakness generalized Patient also relates that he is having episodic weakness of 1 or the other extremity resulting in inability to effectively use his arms or legs which is normally his baseline normal.  5. Noncompliance Patient repeatedly has run out of medication and has not bothered to contact us or to come see [place] about having refills which is contingent upon being able to check on patient on medication to see if this is efficacious at the beginning dose or if we need to make adjustments patient just does not bother come back and then is out for several months and then were back to the original presentation.  It has been suggested to the patient that we go to a ER setting to be evaluated and that there may be some strokelike activity and this needs to be ruled out first before we evaluate for anxiety depression and pursue CBT evaluation.  Patient has been invited to come back next week or sooner if necessary for initiation of low-dose medicine until he can get in with psychiatry as prescribed. Elizabeth Sauer, MD

## 2023-03-08 DIAGNOSIS — R140 Abdominal distension (gaseous): Secondary | ICD-10-CM | POA: Diagnosis not present

## 2023-03-08 DIAGNOSIS — R110 Nausea: Secondary | ICD-10-CM | POA: Diagnosis not present

## 2023-03-08 DIAGNOSIS — D3500 Benign neoplasm of unspecified adrenal gland: Secondary | ICD-10-CM | POA: Diagnosis not present

## 2023-03-13 DIAGNOSIS — K5900 Constipation, unspecified: Secondary | ICD-10-CM | POA: Diagnosis not present

## 2023-03-16 ENCOUNTER — Telehealth: Payer: Self-pay

## 2023-03-16 NOTE — Transitions of Care (Post Inpatient/ED Visit) (Signed)
 03/16/2023  Name: Daniel Frye MRN: [id number] DOB: [birth year]  Today's TOC FU Call Status: Today's TOC FU Call Status:: Successful TOC FU Call Completed TOC FU Call Complete Date: 03/16/23 Patient's Name and Date of Birth confirmed.  Transition Care Management Follow-up Telephone Call Date of Discharge: 03/15/23 Discharge Facility: Other [facility]) Name of Other (Non[facility]) Discharge Facility: [facility] Type of Discharge: Inpatient Admission Primary Inpatient Discharge Diagnosis:: Suicidal ideation   Adrenal nodule How have you been since you were released from the hospital?: Better Any questions or concerns?: No  Items Reviewed: Did you receive and understand the discharge instructions provided?: Yes Medications obtained,verified, and reconciled?: Yes (Medications Reviewed) Any new allergies since your discharge?: No Dietary orders reviewed?: Yes Type of Diet Ordered:: Reg Heart Healthy, NAS Do you have support at home?: Yes People in Home: sibling(s) Name of Support/Comfort Primary Source: Lives with his sister, who is a strong support  Medications Reviewed Today: Medications Reviewed Today     Reviewed by Willma Camelia CROME, RN (Registered Nurse) on 03/16/23 at 1304  Med List Status: <None>   Medication Order Taking? Sig Documenting Provider Last Dose Status Informant  albuterol  (PROAIR  HFA) 108 (90 Base) MCG/ACT inhaler [id number] Yes INHALE 2 PUFFS BY MOUTH EVERY 4 HOURS AS NEEDED FOR  WHEEZING  OR  SHORTNESS  OF  BREATH  Patient taking differently: 2 puffs every 6 (six) hours as needed. INHALE 2 PUFFS BY MOUTH EVERY 4 HOURS AS NEEDED FOR  WHEEZING  OR  SHORTNESS  OF  SHERIDA Joshua Cathryne JAYSON, MD Taking Active   Cholecalciferol  25 MCG (1000 UT) capsule [id number] Yes Take 1,000 Units by mouth daily. [provider] Taking Active   clobetasol ointment (TEMOVATE) 0.05 % [id number] Yes derm [provider] Taking Active   Ensure Plus (ENSURE PLUS) LIQD  [id number] Yes Take 237 mLs by mouth. [provider] Taking Active   Fluticasone -Umeclidin-Vilant (TRELEGY ELLIPTA ) 100-62.5-25 MCG/ACT AEPB [id number] Yes Inhale 1 puff into the lungs daily. [provider] Taking Active   folic acid  (FOLVITE ) 1 MG tablet [id number] Yes Take 1 mg by mouth daily. [provider] Taking Active   hydrochlorothiazide  (HYDRODIURIL ) 12.5 MG tablet [id number] Yes Take 1 tablet (12.5 mg total) by mouth daily. Joshua Cathryne JAYSON, MD Taking Active   Melatonin 3 MG CAPS [id number] Yes Take 1 capsule by mouth at bedtime. [provider] Taking Active   mirtazapine  (REMERON ) 15 MG tablet [id number] Yes Take 15 mg by mouth at bedtime. [provider] Taking Active   nicotine  polacrilex (NICORETTE) 4 MG gum [id number] Yes Take 4 mg by mouth as needed for smoking cessation (every 2 hours). [provider] Taking Active   polyethylene glycol (MIRALAX / GLYCOLAX) 17 g packet [id number] Yes Take 17 g by mouth daily. [provider] Taking Active   thiamine  (VITAMIN B1) 100 MG tablet [id number] Yes Take 200 mg by mouth daily. [provider] Taking Active   Med List Note (Ashe, [name], CPhT 10/04/18 2218): Patient is a resident of [facility]          Medication reconciliation / review completed based on most recent discharge summary and EHR medication list. Updated medications per discharge summary Confirmed patient is taking all newly prescribed medications as instructed and is aware of any changes to and / or dosage adjustments to medication regimen. Patient denies questions at this time and reports no barriers to medication adherence.   Home Care and Equipment/Supplies: Were  Home Health Services Ordered?: No Any new equipment or medical supplies ordered?: No  Functional Questionnaire: Do you need assistance with bathing/showering or dressing?: No Do you need assistance with meal preparation?: No Do  you need assistance with eating?: No Do you have difficulty maintaining continence: No Do you need assistance with getting out of bed/getting out of a chair/moving?: No Do you have difficulty managing or taking your medications?: Yes (Sister helps manage)  Follow up appointments reviewed: PCP Follow-up appointment confirmed?: Yes Date of PCP follow-up appointment?: 03/27/23 Follow-up Provider: Cathryne Molt Specialist [facility] Follow-up appointment confirmed?: NA Do you need transportation to your follow-up appointment?: No (sister drives him) Do you understand care options if your condition(s) worsen?: Yes-patient verbalized understanding  SDOH Interventions Today    Flowsheet Row Most Recent Value  SDOH Interventions   Food Insecurity Interventions Intervention Not Indicated  Housing Interventions Intervention Not Indicated  Transportation Interventions Intervention Not Indicated, Patient Resources (Friends/Family)  Utilities Interventions Intervention Not Indicated  Social Connections Interventions Intervention Not Indicated      Interventions Today    Flowsheet Row Most Recent Value  General Interventions   General Interventions Discussed/Reviewed General Interventions Discussed  Mental Health Interventions   Mental Health Discussed/Reviewed Mental Health Discussed, Depression, Coping Strategies, Suicide  Nutrition Interventions   Nutrition Discussed/Reviewed Nutrition Discussed, Nutrition Reviewed  Pharmacy Interventions   Pharmacy Dicussed/Reviewed Medications and their functions, Medication Adherence  Safety Interventions   Safety Discussed/Reviewed Safety Discussed, Home Safety  [Had fall at home]  Home Safety Assistive Devices          Patient is at high risk for readmission and/or has history of  high utilization  Discussed [facility]  TOC program and weekly calls to patient to assess condition/status, medication management  and provide support/education as indicated .  Patient/ Caregiver voiced understanding and declined enrollment in the 30-day TOC Program.      The patient has been provided with contact information for the care management team and has been advised to call with any health related questions or concerns.   Bari Mayans , BSN, RN Care Management Coordinator [facility] [email or web address] Direct Dial: [phone]

## 2023-03-27 ENCOUNTER — Inpatient Hospital Stay: Payer: Medicare Other | Admitting: Family Medicine

## 2023-04-11 ENCOUNTER — Telehealth: Payer: Self-pay | Admitting: Family Medicine

## 2023-04-11 NOTE — Telephone Encounter (Signed)
 Copied from CRM [id number]. Topic: General - Other >> Apr 11, 2023  2:38 PM Tobias CROME wrote: Reason for CRM: Beverley, Nurse Practitioner with house calls did a circulation test on patient, normal reading is 1 left foot read: 0.2 and right foot read: 0.38. Calling to inform provider of readings.   Pt is currently scheduled in [place] with [facility], sister has attempted to have someone change appt to [place] or [place]. Pt unable to go to [place]. Requesting referral be changed.   Call number: [phone]

## 2023-04-11 NOTE — Telephone Encounter (Signed)
Sent referrals team a message to refax to [facility].

## 2023-04-17 ENCOUNTER — Ambulatory Visit (INDEPENDENT_AMBULATORY_CARE_PROVIDER_SITE_OTHER): Payer: 59 | Admitting: Family Medicine

## 2023-04-17 ENCOUNTER — Encounter: Payer: Self-pay | Admitting: Family Medicine

## 2023-04-17 VITALS — BP 128/74 | HR 74 | Ht 69.0 in | Wt 183.0 lb

## 2023-04-17 DIAGNOSIS — I10 Essential (primary) hypertension: Secondary | ICD-10-CM

## 2023-04-17 DIAGNOSIS — F32A Depression, unspecified: Secondary | ICD-10-CM

## 2023-04-17 DIAGNOSIS — J441 Chronic obstructive pulmonary disease with (acute) exacerbation: Secondary | ICD-10-CM

## 2023-04-17 MED ORDER — HYDROCHLOROTHIAZIDE 12.5 MG PO TABS: 12.5000 mg | ORAL_TABLET | Freq: Every day | ORAL | 1 refills | Status: DC

## 2023-04-17 MED ORDER — ALBUTEROL SULFATE HFA 108 (90 BASE) MCG/ACT IN AERS: 2.0000 | INHALATION_SPRAY | Freq: Four times a day (QID) | RESPIRATORY_TRACT | 6 refills | Status: DC | PRN

## 2023-04-17 MED ORDER — MIRTAZAPINE 30 MG PO TBDP: 30.0000 mg | ORAL_TABLET | Freq: Every day | ORAL | 1 refills | Status: DC

## 2023-04-17 NOTE — Progress Notes (Signed)
 Date:  04/17/2023   Name:  Daniel Frye   DOB:  01-Feb-1951   MRN:  [id number]   Chief Complaint: Hypertension and Depression  Hypertension This is a chronic problem. The current episode started more than 1 year ago. The problem has been gradually improving since onset. The problem is controlled. Pertinent negatives include no anxiety, blurred vision, chest pain, headaches, malaise/fatigue, neck pain, orthopnea, palpitations, peripheral edema, PND, shortness of breath or sweats. There are no associated agents to hypertension. Risk factors for coronary artery disease include dyslipidemia. Past treatments include diuretics. The current treatment provides moderate improvement. There are no compliance problems.  There is no history of angina, kidney disease, CAD/MI or CVA. There is no history of chronic renal disease, a hypertension causing med or renovascular disease.  Depression        The current episode started more than 1 year ago.   The onset quality is sudden.   The problem occurs intermittently.  The problem has been gradually improving since onset.  Associated symptoms include decreased concentration, fatigue, hopelessness, insomnia, restlessness, decreased interest and sad.  Associated symptoms include no helplessness, not irritable, no appetite change, no body aches, no myalgias, no headaches, no indigestion and no suicidal ideas.  Past treatments include other medications (mirtazepine 15 mg).  Compliance with treatment is good.  Previous treatment provided moderate relief.   Pertinent negatives include no anxiety.   Lab Results  Component Value Date   NA 135 02/16/2023   K 4.5 02/16/2023   CO2 20 02/16/2023   GLUCOSE 97 02/16/2023   BUN 22 02/16/2023   CREATININE 1.54 (H) 02/16/2023   CALCIUM 9.5 02/16/2023   EGFR 47 (L) 02/16/2023   GFRNONAA >60 10/09/2019   No results found for: "CHOL", "HDL", "LDLCALC", "LDLDIRECT", "TRIG", "CHOLHDL" Lab Results  Component Value Date   TSH  0.476 08/25/2020   Lab Results  Component Value Date   HGBA1C 5.5 02/16/2023   Lab Results  Component Value Date   WBC 3.4 08/25/2020   HGB 12.6 (L) 08/25/2020   HCT 37.3 (L) 08/25/2020   MCV 96 08/25/2020   PLT 73 (LL) 08/25/2020   Lab Results  Component Value Date   ALT 20 08/09/2021   AST 21 08/09/2021   ALKPHOS 114 08/09/2021   BILITOT 0.3 08/09/2021   Lab Results  Component Value Date   VD25OH 11.1 (L) 01/26/2015     Review of Systems  Constitutional:  Positive for fatigue. Negative for appetite change and malaise/fatigue.  Eyes:  Negative for blurred vision and visual disturbance.  Respiratory:  Negative for choking, shortness of breath and wheezing.   Cardiovascular:  Negative for chest pain, palpitations, orthopnea and PND.  Gastrointestinal:  Negative for abdominal pain.  Endocrine: Negative for polydipsia and polyuria.  Genitourinary:  Negative for difficulty urinating.  Musculoskeletal:  Negative for myalgias and neck pain.  Neurological:  Negative for headaches.  Psychiatric/Behavioral:  Positive for decreased concentration and depression. Negative for suicidal ideas. The patient has insomnia.     Patient Active Problem List   Diagnosis Date Noted   Polyp of transverse colon    Chronic pain syndrome 01/14/2020   Pharmacologic therapy 01/14/2020   Disorder of skeletal system 01/14/2020   Problems influencing health status 01/14/2020   History of substance use disorder 01/14/2020   Abnormal drug screens 01/14/2020   History of alcohol abuse 01/14/2020   History of cocaine use 01/14/2020   History of marijuana use 01/14/2020   History  of tobacco abuse 01/14/2020   Acute anaphylaxis 07/31/2019   Acute respiratory failure with hypoxia (HCC) 07/30/2019   DDD (degenerative disc disease), lumbar 01/07/2019   Cervical spondylosis with myelopathy 08/01/2018   Neck pain 05/30/2018   Degenerative cervical spinal stenosis 05/29/2018   Aortic atherosclerosis  (HCC) 05/15/2018   Depression 04/01/2018   Numbness and tingling in both hands 03/13/2018   Encounter for long-term (current) use of high-risk medication 08/09/2017   Primary osteoarthritis of both knees 08/09/2017   Hypokalemia 03/03/2016   Hypomagnesemia 03/03/2016   Substance induced mood disorder (HCC) 03/02/2016   Alcohol withdrawal (HCC) 02/29/2016   Crack cocaine use 01/22/2016   Obstructive sleep apnea of adult 03/30/2015   Edema of foot 03/30/2015   Chronic diastolic CHF (congestive heart failure), NYHA class 2 (HCC) 03/30/2015   Apnea, sleep 03/30/2015   Alcohol abuse 03/23/2015   Noncompliance 03/23/2015   Vitamin D  deficiency 01/27/2015   Diabetes mellitus type 2, controlled, without complications (HCC) 12/03/2014   Obesity (BMI 30.0-34.9) 11/26/2014   COPD (chronic obstructive pulmonary disease) (HCC) 06/22/2014   Essential (primary) hypertension 06/22/2014   Rheumatoid arthritis involving multiple sites (HCC) 06/22/2014   Tobacco abuse 06/22/2014   Chronic obstructive pulmonary disease with acute exacerbation (HCC) 06/22/2014   History of drug abuse (HCC) 03/05/2009   SOB (shortness of breath) on exertion 03/05/2009   Alcohol abuse, daily use 03/05/2009   Cocaine dependence, abuse (HCC) 03/05/2009   Fever 03/05/2009   Wheezing 03/05/2009    Allergies  Allergen Reactions   Renflexis [Infliximab] Anaphylaxis    Past Surgical History:  Procedure Laterality Date   BACK SURGERY     COLONOSCOPY WITH PROPOFOL  N/A 12/31/2020   Procedure: COLONOSCOPY WITH PROPOFOL ;  Surgeon: Marnee Sink, MD;  Location: [facility] ENDOSCOPY;  Service: Endoscopy;  Laterality: N/A;   heart surgery to drain fluid N/A 03/07/1985   Heart surgery to remove fluid     Social History   Tobacco Use   Smoking status: Every Day    Current packs/day: 0.25    Average packs/day: 0.3 packs/day for 51.0 years (12.8 ttl pk-yrs)    Types: Cigarettes, Cigars   Smokeless tobacco: Never   Tobacco  comments:    Wants to quit no quit date set  Vaping Use   Vaping status: Never Used  Substance Use Topics   Alcohol use: Yes    Alcohol/week: 49.0 standard drinks of alcohol    Types: 49 Standard drinks or equivalent per week    Comment: Drinks 1/5 day of wine    Drug use: Not Currently    Comment: no drugs in 1 year and 3 months     Medication list has been reviewed and updated.  Current Meds  Medication Sig   albuterol  (PROAIR  HFA) 108 (90 Base) MCG/ACT inhaler INHALE 2 PUFFS BY MOUTH EVERY 4 HOURS AS NEEDED FOR  WHEEZING  OR  SHORTNESS  OF  BREATH (Patient taking differently: 2 puffs every 6 (six) hours as needed. INHALE 2 PUFFS BY MOUTH EVERY 4 HOURS AS NEEDED FOR  WHEEZING  OR  SHORTNESS  OF  BREATH)   Cholecalciferol  25 MCG (1000 UT) capsule Take 1,000 Units by mouth daily.   clobetasol ointment (TEMOVATE) 0.05 % derm   Ensure Plus (ENSURE PLUS) LIQD Take 237 mLs by mouth.   Fluticasone -Umeclidin-Vilant (TRELEGY ELLIPTA ) 100-62.5-25 MCG/ACT AEPB Inhale 1 puff into the lungs daily.   folic acid  (FOLVITE ) 1 MG tablet Take 1 mg by mouth daily.   hydrochlorothiazide  (  HYDRODIURIL ) 12.5 MG tablet Take 1 tablet (12.5 mg total) by mouth daily.   Melatonin 3 MG CAPS Take 1 capsule by mouth at bedtime.   mirtazapine  (REMERON ) 15 MG tablet Take 15 mg by mouth at bedtime.   nicotine  polacrilex (NICORETTE) 4 MG gum Take 4 mg by mouth as needed for smoking cessation (every 2 hours).   polyethylene glycol (MIRALAX / GLYCOLAX) 17 g packet Take 17 g by mouth daily.   thiamine  (VITAMIN B1) 100 MG tablet Take 200 mg by mouth daily.       04/17/2023    1:29 PM 03/02/2023    1:57 PM 02/16/2023    1:27 PM 08/16/2022    4:13 PM  GAD 7 : Generalized Anxiety Score  Nervous, Anxious, on Edge 1 3 0 1  Control/stop worrying 1 3 0 0  Worry too much - different things 2 3 1  0  Trouble relaxing 0 3 1 2   Restless 1 3 0 1  Easily annoyed or irritable 0 3 1 0  Afraid - awful might happen 1 3 0 1   Total GAD 7 Score 6 21 3 5   Anxiety Difficulty Somewhat difficult Extremely difficult Not difficult at all Somewhat difficult       04/17/2023    1:29 PM 03/02/2023    1:56 PM 02/16/2023    1:26 PM  Depression screen PHQ 2/9  Decreased Interest 1 1 0  Down, Depressed, Hopeless 3 3 1   PHQ - 2 Score 4 4 1   Altered sleeping 1 3 1   Tired, decreased energy 2 3   Change in appetite 0 3 0  Feeling bad or failure about yourself  1 3 1   Trouble concentrating 1 3 0  Moving slowly or fidgety/restless 1 3 0  Suicidal thoughts 1 3   PHQ-9 Score 11 25 3   Difficult doing work/chores Somewhat difficult Extremely dIfficult Not difficult at all    BP Readings from Last 3 Encounters:  04/17/23 128/74  03/02/23 122/78  02/16/23 132/80    Physical Exam Vitals and nursing note reviewed.  Constitutional:      General: He is not irritable.    Appearance: He is well-developed.  HENT:     Head: Normocephalic and atraumatic.     Right Ear: Tympanic membrane, ear canal and external ear normal.     Left Ear: Tympanic membrane, ear canal and external ear normal.     Nose: Nose normal.     Mouth/Throat:     Mouth: Mucous membranes are moist.     Dentition: Normal dentition.  Eyes:     General: Lids are normal. No scleral icterus.    Conjunctiva/sclera: Conjunctivae normal.     Pupils: Pupils are equal, round, and reactive to light.  Neck:     Thyroid : No thyromegaly.     Vascular: No carotid bruit, hepatojugular reflux or JVD.     Trachea: No tracheal deviation.  Cardiovascular:     Rate and Rhythm: Normal rate and regular rhythm.     Heart sounds: Normal heart sounds. No murmur heard.    No friction rub. No gallop.  Pulmonary:     Effort: Pulmonary effort is normal.     Breath sounds: Normal breath sounds. No wheezing, rhonchi or rales.  Abdominal:     General: Bowel sounds are normal.     Palpations: Abdomen is soft. There is no hepatomegaly, splenomegaly or mass.     Tenderness:  There is no abdominal tenderness.  Hernia: There is no hernia in the left inguinal area.  Musculoskeletal:        General: Normal range of motion.     Cervical back: Normal range of motion and neck supple.  Lymphadenopathy:     Cervical: No cervical adenopathy.  Skin:    General: Skin is warm and dry.     Findings: No rash.  Neurological:     Mental Status: He is alert and oriented to person, place, and time.     Sensory: No sensory deficit.     Deep Tendon Reflexes: Reflexes are normal and symmetric.  Psychiatric:        Mood and Affect: Mood is not anxious or depressed.     Wt Readings from Last 3 Encounters:  04/17/23 183 lb (83 kg)  03/02/23 178 lb (80.7 kg)  02/16/23 179 lb 12.8 oz (81.6 kg)    BP 128/74   Pulse 74   Ht 5\' 9"  (1.753 m)   Wt 183 lb (83 kg)   SpO2 97%   BMI 27.02 kg/m   Assessment and Plan:  1. Primary hypertension (Primary) Chronic.  Controlled.  Stable.  Blood pressure 128/74.  Asymptomatic.  Tolerating medication well.  Continue hydrochlorothiazide  12.5 mg once a day.  Will recheck labs in 6 weeks upon return.  We will also recheck for depression since medication change was made on the mirtazapine . - hydrochlorothiazide  (HYDRODIURIL ) 12.5 MG tablet; Take 1 tablet (12.5 mg total) by mouth daily.  Dispense: 90 tablet; Refill: 1  2. Depression, unspecified depression type New onset.  Patient was admitted for suicidal concerns on 03/08/2023.  He was started on mirtazapine  15 mg and would like to increase to the next dosing level.  PHQ was 11 GAD score is 6.  We will increase from 15 to 30 mg on mirtazapine  patient denied any suicide concerns at this time and would like to return in 4 weeks which time we will recheck blood pressure and likely obtain labs and continue mirtazapine  30 mg.  Patient has been instructed if there is any issues of thoughts of harming self or others that he will call or seek immediate psychiatric assistance. - mirtazapine  (REMERON   SOL-TAB) 30 MG disintegrating tablet; Take 1 tablet (30 mg total) by mouth at bedtime.  Dispense: 30 tablet; Refill: 1  3. Chronic obstructive pulmonary disease with acute exacerbation (HCC) Chronic.  Controlled.  Stable.  Breathing is controlled on current albuterol  inhaler 1 to 2 puffs every 4-6 hours as needed. - albuterol  (PROAIR  HFA) 108 (90 Base) MCG/ACT inhaler; Inhale 2 puffs into the lungs every 6 (six) hours as needed. INHALE 2 PUFFS BY MOUTH EVERY 4 HOURS AS NEEDED FOR  WHEEZING  OR  SHORTNESS  OF  BREATH  Dispense: 18 g; Refill: 6    Alayne Allis, MD

## 2023-05-15 ENCOUNTER — Ambulatory Visit: Payer: 59 | Admitting: Family Medicine

## 2023-05-29 ENCOUNTER — Ambulatory Visit: Payer: Self-pay | Admitting: Family Medicine

## 2023-05-29 NOTE — Telephone Encounter (Signed)
 Chief Complaint: Feet and abdomen swelling Symptoms: Depression  Frequency: Ongoing x8-9 years; came back x2 weeks ago Pertinent Negatives: Patient denies vomiting, blood in bowel movements Disposition: [] ED /[] Urgent Care (no appt availability in office) / [x] Appointment(In office/virtual)/ []  [facility]/ [] Home Care/ [] Refused Recommended Disposition /[] [facility]/ []  Follow-up with PCP Additional Notes: Pt states he is out of fluid pills as the swelling went away but it is now coming back. Pt also wants a refill on his depression medication. Appt made for tomorrow with PCP. This RN educated pt on home care, new-worsening symptoms, when to call back/seek emergent care. Pt verbalized understanding and agrees to plan.   Reason for Disposition  [1] MILD abdomen pain (e.g., does not interfere with normal activities) AND [2] pain comes and goes (cramps) AND [3] present > 48 hours  (Exception: Mild abdomen pain is a chronic symptom, recurrent or ongoing AND present > 4 weeks.)  Answer Assessment - Initial Assessment Questions Abdomen swelling Bilateral foot swelling Going on 8-9 years "Ran out of fluid pill" Difficulty breathing- pt has COPD, fluid retention makes it worse, using inhaler which helps Feeling depressed  Protocols used: Abdomen Bloating and Swelling-A-AH

## 2023-05-29 NOTE — Telephone Encounter (Signed)
 Noted  Pt has a appt.  KP

## 2023-05-30 ENCOUNTER — Encounter: Payer: Self-pay | Admitting: Family Medicine

## 2023-05-30 ENCOUNTER — Ambulatory Visit (INDEPENDENT_AMBULATORY_CARE_PROVIDER_SITE_OTHER): Admitting: Family Medicine

## 2023-05-30 VITALS — BP 124/80 | HR 60 | Ht 69.0 in | Wt 187.0 lb

## 2023-05-30 DIAGNOSIS — Z91199 Patient's noncompliance with other medical treatment and regimen due to unspecified reason: Secondary | ICD-10-CM | POA: Diagnosis not present

## 2023-05-30 DIAGNOSIS — F1010 Alcohol abuse, uncomplicated: Secondary | ICD-10-CM

## 2023-05-30 DIAGNOSIS — D696 Thrombocytopenia, unspecified: Secondary | ICD-10-CM | POA: Diagnosis not present

## 2023-05-30 DIAGNOSIS — Z87891 Personal history of nicotine dependence: Secondary | ICD-10-CM

## 2023-05-30 DIAGNOSIS — I10 Essential (primary) hypertension: Secondary | ICD-10-CM | POA: Diagnosis not present

## 2023-05-30 MED ORDER — FUROSEMIDE 20 MG PO TABS: 20.0000 mg | ORAL_TABLET | Freq: Every day | ORAL | 3 refills | Status: DC

## 2023-05-30 NOTE — Progress Notes (Signed)
 Date:  05/30/2023   Name:  Daniel Frye   DOB:  24-Apr-1950   MRN:  [id number]   Chief Complaint: Edema (Patient presents today to discuss abdomen and foot swelling. This has been going on for a while and he needs a refill on his medications. )  Hypertension This is a chronic problem. The current episode started more than 1 year ago. The problem has been waxing and waning since onset. The problem is controlled. Pertinent negatives include no anxiety, blurred vision, chest pain, headaches, malaise/fatigue, neck pain, orthopnea, palpitations, peripheral edema, PND, shortness of breath or sweats. There are no associated agents to hypertension. Past treatments include diuretics.    Lab Results  Component Value Date   NA 135 02/16/2023   K 4.5 02/16/2023   CO2 20 02/16/2023   GLUCOSE 97 02/16/2023   BUN 22 02/16/2023   CREATININE 1.54 (H) 02/16/2023   CALCIUM 9.5 02/16/2023   EGFR 47 (L) 02/16/2023   GFRNONAA >60 10/09/2019   No results found for: "CHOL", "HDL", "LDLCALC", "LDLDIRECT", "TRIG", "CHOLHDL" Lab Results  Component Value Date   TSH 0.476 08/25/2020   Lab Results  Component Value Date   HGBA1C 5.5 02/16/2023   Lab Results  Component Value Date   WBC 3.4 08/25/2020   HGB 12.6 (L) 08/25/2020   HCT 37.3 (L) 08/25/2020   MCV 96 08/25/2020   PLT 73 (LL) 08/25/2020   Lab Results  Component Value Date   ALT 20 08/09/2021   AST 21 08/09/2021   ALKPHOS 114 08/09/2021   BILITOT 0.3 08/09/2021   Lab Results  Component Value Date   VD25OH 11.1 (L) 01/26/2015     Review of Systems  Constitutional:  Negative for malaise/fatigue.  Eyes:  Negative for blurred vision, redness and visual disturbance.  Respiratory:  Negative for cough, chest tightness, shortness of breath and stridor.   Cardiovascular:  Negative for chest pain, palpitations, orthopnea and PND.  Musculoskeletal:  Negative for neck pain.  Neurological:  Negative for headaches.    Patient Active Problem  List   Diagnosis Date Noted   Polyp of transverse colon    Chronic pain syndrome 01/14/2020   Pharmacologic therapy 01/14/2020   Disorder of skeletal system 01/14/2020   Problems influencing health status 01/14/2020   History of substance use disorder 01/14/2020   Abnormal drug screens 01/14/2020   History of alcohol abuse 01/14/2020   History of cocaine use 01/14/2020   History of marijuana use 01/14/2020   History of tobacco abuse 01/14/2020   Acute anaphylaxis 07/31/2019   Acute respiratory failure with hypoxia (HCC) 07/30/2019   DDD (degenerative disc disease), lumbar 01/07/2019   Cervical spondylosis with myelopathy 08/01/2018   Neck pain 05/30/2018   Degenerative cervical spinal stenosis 05/29/2018   Aortic atherosclerosis (HCC) 05/15/2018   Depression 04/01/2018   Numbness and tingling in both hands 03/13/2018   Encounter for long-term (current) use of high-risk medication 08/09/2017   Primary osteoarthritis of both knees 08/09/2017   Hypokalemia 03/03/2016   Hypomagnesemia 03/03/2016   Substance induced mood disorder (HCC) 03/02/2016   Alcohol withdrawal (HCC) 02/29/2016   Crack cocaine use 01/22/2016   Obstructive sleep apnea of adult 03/30/2015   Edema of foot 03/30/2015   Chronic diastolic CHF (congestive heart failure), NYHA class 2 (HCC) 03/30/2015   Apnea, sleep 03/30/2015   Alcohol abuse 03/23/2015   Noncompliance 03/23/2015   Vitamin D deficiency 01/27/2015   Diabetes mellitus type 2, controlled, without complications (HCC) 12/03/2014  Obesity (BMI 30.0-34.9) 11/26/2014   COPD (chronic obstructive pulmonary disease) (HCC) 06/22/2014   Essential (primary) hypertension 06/22/2014   Rheumatoid arthritis involving multiple sites (HCC) 06/22/2014   Tobacco abuse 06/22/2014   Chronic obstructive pulmonary disease with acute exacerbation (HCC) 06/22/2014   History of drug abuse (HCC) 03/05/2009   SOB (shortness of breath) on exertion 03/05/2009   Alcohol abuse,  daily use 03/05/2009   Cocaine dependence, abuse (HCC) 03/05/2009   Fever 03/05/2009   Wheezing 03/05/2009    Allergies  Allergen Reactions   Renflexis [Infliximab] Anaphylaxis    Past Surgical History:  Procedure Laterality Date   BACK SURGERY     COLONOSCOPY WITH PROPOFOL N/A 12/31/2020   Procedure: COLONOSCOPY WITH PROPOFOL;  Surgeon: Midge Minium, MD;  Location: [facility] ENDOSCOPY;  Service: Endoscopy;  Laterality: N/A;   heart surgery to drain fluid N/A 03/07/1985   Heart surgery to remove fluid     Social History   Tobacco Use   Smoking status: Every Day    Current packs/day: 0.25    Average packs/day: 0.3 packs/day for 51.0 years (12.8 ttl pk-yrs)    Types: Cigarettes, Cigars   Smokeless tobacco: Never   Tobacco comments:    Wants to quit no quit date set  Vaping Use   Vaping status: Never Used  Substance Use Topics   Alcohol use: Yes    Alcohol/week: 49.0 standard drinks of alcohol    Types: 49 Standard drinks or equivalent per week    Comment: Drinks 1/5 day of wine    Drug use: Not Currently    Comment: no drugs in 1 year and 3 months     Medication list has been reviewed and updated.  Current Meds  Medication Sig   albuterol (PROAIR HFA) 108 (90 Base) MCG/ACT inhaler Inhale 2 puffs into the lungs every 6 (six) hours as needed. INHALE 2 PUFFS BY MOUTH EVERY 4 HOURS AS NEEDED FOR  WHEEZING  OR  SHORTNESS  OF  BREATH   Ensure Plus (ENSURE PLUS) LIQD Take 237 mLs by mouth.   hydrochlorothiazide (HYDRODIURIL) 12.5 MG tablet Take 1 tablet (12.5 mg total) by mouth daily.   mirtazapine (REMERON SOL-TAB) 30 MG disintegrating tablet Take 1 tablet (30 mg total) by mouth at bedtime.       05/30/2023   10:54 AM 04/17/2023    1:29 PM 03/02/2023    1:57 PM 02/16/2023    1:27 PM  GAD 7 : Generalized Anxiety Score  Nervous, Anxious, on Edge 1 1 3  0  Control/stop worrying 1 1 3  0  Worry too much - different things 1 2 3 1   Trouble relaxing 0 0 3 1  Restless 0 1 3 0   Easily annoyed or irritable 1 0 3 1  Afraid - awful might happen 0 1 3 0  Total GAD 7 Score 4 6 21 3   Anxiety Difficulty Not difficult at all Somewhat difficult Extremely difficult Not difficult at all       05/30/2023   10:51 AM 04/17/2023    1:29 PM 03/02/2023    1:56 PM  Depression screen PHQ 2/9  Decreased Interest 1 1 1   Down, Depressed, Hopeless 1 3 3   PHQ - 2 Score 2 4 4   Altered sleeping 0 1 3  Tired, decreased energy 3 2 3   Change in appetite 0 0 3  Feeling bad or failure about yourself  1 1 3   Trouble concentrating 1 1 3   Moving slowly or fidgety/restless  0 1 3  Suicidal thoughts 0 1 3  PHQ-9 Score 7 11 25   Difficult doing work/chores Somewhat difficult Somewhat difficult Extremely dIfficult    BP Readings from Last 3 Encounters:  05/30/23 (!) 150/92  04/17/23 128/74  03/02/23 122/78    Physical Exam Vitals and nursing note reviewed.  Constitutional:      Appearance: He is well-developed.  HENT:     Head: Normocephalic and atraumatic.     Right Ear: Tympanic membrane, ear canal and external ear normal.     Left Ear: Tympanic membrane, ear canal and external ear normal.     Nose: Nose normal.     Mouth/Throat:     Dentition: Normal dentition.  Eyes:     General: Lids are normal. No scleral icterus.    Conjunctiva/sclera: Conjunctivae normal.     Pupils: Pupils are equal, round, and reactive to light.  Neck:     Thyroid: No thyromegaly.     Vascular: No carotid bruit, hepatojugular reflux or JVD.     Trachea: No tracheal deviation.  Cardiovascular:     Rate and Rhythm: Normal rate and regular rhythm.     Heart sounds: Normal heart sounds, S1 normal and S2 normal.     No systolic murmur is present.     No diastolic murmur is present.     No S3 or S4 sounds.  Pulmonary:     Effort: Pulmonary effort is normal.     Breath sounds: Normal breath sounds. No wheezing, rhonchi or rales.  Abdominal:     General: Bowel sounds are normal.     Palpations:  Abdomen is soft. There is no hepatomegaly, splenomegaly or mass.     Tenderness: There is no abdominal tenderness.     Hernia: There is no hernia in the left inguinal area.  Musculoskeletal:        General: Normal range of motion.     Cervical back: Normal range of motion and neck supple.  Lymphadenopathy:     Cervical: No cervical adenopathy.  Skin:    General: Skin is warm and dry.     Findings: No rash.  Neurological:     Mental Status: He is alert and oriented to person, place, and time.     Sensory: No sensory deficit.     Deep Tendon Reflexes: Reflexes are normal and symmetric.  Psychiatric:        Mood and Affect: Mood is not anxious or depressed.     Wt Readings from Last 3 Encounters:  05/30/23 187 lb (84.8 kg)  04/17/23 183 lb (83 kg)  03/02/23 178 lb (80.7 kg)    BP (!) 150/92   Pulse 60   Ht 5\' 9"  (1.753 m)   Wt 187 lb (84.8 kg)   SpO2 96%   BMI 27.62 kg/m   Assessment and Plan:  1. Primary hypertension (Primary) Chronic.  Controlled.  Stable.  Patient is developing some ascites and says his legs swell up I have a feeling that he is starting to get into some latter stages of hepatic concern for which we are going to need to do with his alcohol use.  We will initiate low-dose Lasix 20 mg since he has had gradual weight gain which is most likely of a fluid nature.  Will check renal function panel for GFR and electrolytes and will recheck patient in 4 weeks - furosemide (LASIX) 20 MG tablet; Take 1 tablet (20 mg total) by mouth daily.  Dispense:  30 tablet; Refill: 3 - Renal Function Panel  2. History of tobacco abuse Patient has been advised of the health risks of smoking and counseled concerning cessation of tobacco products. I spent over 3 minutes for discussion and to answer questions.   3. Alcohol abuse, daily use Discussed with the patient his daily use of alcohol and its contribution to the impairment of his liver.  We will check his hepatic function panel  today transaminases have been elevated. - Hepatic Function Panel (6)  4. Noncompliance Mr. Biegel is probably one of my most noncompliant patients however we will continue to address particularly his alcohol abuse as well as his smoking and substance abuse history.  5. Thrombocytopenia (HCC) Previous CBC notes a decrease in platelet count which is probably secondary to his hepatic disease we will obtain a platelet count with a CBC and calculate his fib 4 and if significantly elevated consider referral to GI. - CBC with Differential/Platelet    Elizabeth Sauer, MD

## 2023-05-31 ENCOUNTER — Other Ambulatory Visit: Payer: Self-pay

## 2023-05-31 DIAGNOSIS — R7989 Other specified abnormal findings of blood chemistry: Secondary | ICD-10-CM

## 2023-05-31 DIAGNOSIS — R109 Unspecified abdominal pain: Secondary | ICD-10-CM

## 2023-05-31 LAB — CBC WITH DIFFERENTIAL/PLATELET
Basophils Absolute: 0 10*3/uL (ref 0.0–0.2)
Basos: 0 %
EOS (ABSOLUTE): 0.3 10*3/uL (ref 0.0–0.4)
Eos: 4 %
Hematocrit: 41.7 % (ref 37.5–51.0)
Hemoglobin: 13.9 g/dL (ref 13.0–17.7)
Immature Grans (Abs): 0 10*3/uL (ref 0.0–0.1)
Immature Granulocytes: 0 %
Lymphocytes Absolute: 1.8 10*3/uL (ref 0.7–3.1)
Lymphs: 27 %
MCH: 31.7 pg (ref 26.6–33.0)
MCHC: 33.3 g/dL (ref 31.5–35.7)
MCV: 95 fL (ref 79–97)
Monocytes Absolute: 0.5 10*3/uL (ref 0.1–0.9)
Monocytes: 8 %
Neutrophils Absolute: 4 10*3/uL (ref 1.4–7.0)
Neutrophils: 61 %
Platelets: 176 10*3/uL (ref 150–450)
RBC: 4.39 x10E6/uL (ref 4.14–5.80)
RDW: 13.1 % (ref 11.6–15.4)
WBC: 6.7 10*3/uL (ref 3.4–10.8)

## 2023-05-31 LAB — RENAL FUNCTION PANEL
Albumin: 4.1 g/dL (ref 3.8–4.8)
BUN/Creatinine Ratio: 11 (ref 10–24)
BUN: 17 mg/dL (ref 8–27)
CO2: 21 mmol/L (ref 20–29)
Calcium: 9.4 mg/dL (ref 8.6–10.2)
Chloride: 100 mmol/L (ref 96–106)
Creatinine, Ser: 1.53 mg/dL — ABNORMAL HIGH (ref 0.76–1.27)
Glucose: 93 mg/dL (ref 70–99)
Phosphorus: 2.2 mg/dL — ABNORMAL LOW (ref 2.8–4.1)
Potassium: 4.3 mmol/L (ref 3.5–5.2)
Sodium: 137 mmol/L (ref 134–144)
eGFR: 47 mL/min/{1.73_m2} — ABNORMAL LOW (ref >59)

## 2023-05-31 LAB — HEPATIC FUNCTION PANEL (6)
ALT: 36 IU/L (ref 0–44)
AST: 52 IU/L — ABNORMAL HIGH (ref 0–40)
Alkaline Phosphatase: 131 IU/L — ABNORMAL HIGH (ref 44–121)
Bilirubin Total: 0.4 mg/dL (ref 0.0–1.2)
Bilirubin, Direct: 0.16 mg/dL (ref 0.00–0.40)

## 2023-06-14 ENCOUNTER — Ambulatory Visit
Admission: RE | Admit: 2023-06-14 | Discharge: 2023-06-14 | Disposition: A | Discharge status: Home / Self Care | Source: Ambulatory Visit | Attending: Family Medicine | Admitting: Family Medicine

## 2023-06-14 DIAGNOSIS — R109 Unspecified abdominal pain: Secondary | ICD-10-CM | POA: Diagnosis not present

## 2023-06-14 DIAGNOSIS — R7989 Other specified abnormal findings of blood chemistry: Secondary | ICD-10-CM | POA: Diagnosis not present

## 2023-06-16 ENCOUNTER — Ambulatory Visit: Payer: Self-pay

## 2023-06-16 NOTE — Telephone Encounter (Signed)
 Copied from CRM [id number]. Topic: Clinical - Red Word Triage >> Jun 16, 2023  9:41 AM Fuller Mandril wrote: Red Word that prompted transfer to Nurse Triage: Depressed, So depressed can't hardly move requesting refill    Chief Complaint: Pt. States he has been out of "my depression pill for 6 weeks. I feel depressed." Asking for refill. No antidepressant on med list. Declines OV. Symptoms: Above Frequency: Several weeks Pertinent Negatives: Patient denies thoughts of self harm. Disposition: [] ED /[] Urgent Care (no appt availability in office) / [] Appointment(In office/virtual)/ []  [place] [facility]/ [] Home Care/ [] Refused Recommended Disposition /[] [place] [facility]/ [x]  Follow-up with PCP Additional Notes: Please advise pt.  Reason for Disposition  [1] Depression AND [2] worsening (e.g., sleeping poorly, less able to do activities of daily living)  Answer Assessment - Initial Assessment Questions 1. CONCERN: "What happened that made you call today?"     Depression 2. DEPRESSION SYMPTOM SCREENING: "How are you feeling overall?" (e.g., decreased energy, increased sleeping or difficulty sleeping, difficulty concentrating, feelings of sadness, guilt, hopelessness, or worthlessness)     Sad 3. RISK OF HARM - SUICIDAL IDEATION:  "Do you ever have thoughts of hurting or killing yourself?"  (e.g., yes, no, no but preoccupation with thoughts about death)   - INTENT:  "Do you have thoughts of hurting or killing yourself right NOW?" (e.g., yes, no, N/A)   - PLAN: "Do you have a specific plan for how you would do this?" (e.g., gun, knife, overdose, no plan, N/A)     No 4. RISK OF HARM - HOMICIDAL IDEATION:  "Do you ever have thoughts of hurting or killing someone else?"  (e.g., yes, no, no but preoccupation with thoughts about death)   - INTENT:  "Do you have thoughts of hurting or killing someone right NOW?" (e.g., yes, no, N/A)   - PLAN: "Do you have a specific plan for how you would do this?"  (e.g., gun, knife, no plan, N/A)      No 5. FUNCTIONAL IMPAIRMENT: "How have things been going for you overall? Have you had more difficulty than usual doing your normal daily activities?"  (e.g., better, same, worse; self-care, school, work, interactions)     Yes 6. SUPPORT: "Who is with you now?" "Who do you live with?" "Do you have family or friends who you can talk to?"      Sister 7. THERAPIST: "Do you have a counselor or therapist? Name?"     No 8. STRESSORS: "Has there been any new stress or recent changes in your life?"     No 9. ALCOHOL USE OR SUBSTANCE USE (DRUG USE): "Do you drink alcohol or use any illegal drugs?"     No 10. OTHER: "Do you have any other physical symptoms right now?" (e.g., fever)       No 11. PREGNANCY: "Is there any chance you are pregnant?" "When was your last menstrual period?"       N/a  Protocols used: Depression-A-AH

## 2023-06-16 NOTE — Telephone Encounter (Signed)
 Called pt let him know that 30 with 1 refill was sent in on 04/17/23. Told pt he should have not ran out of medication for 6 weeks. Told pt to call [facility] to see if he has a refill. Pt will call pharmacy.  KP

## 2023-06-19 ENCOUNTER — Other Ambulatory Visit: Payer: Self-pay

## 2023-06-19 DIAGNOSIS — R748 Abnormal levels of other serum enzymes: Secondary | ICD-10-CM

## 2023-06-21 ENCOUNTER — Other Ambulatory Visit: Payer: Self-pay | Admitting: Family Medicine

## 2023-06-21 NOTE — Telephone Encounter (Signed)
 Per chart review, it states pt is not taking this medication reported on 05/30/2023. RN called the pt. Pt states he does take this medication and is on his last inhaler. Pt denies new or worsening symptoms.  Copied from CRM [id number]. Topic: Clinical - Medication Refill >> Jun 21, 2023 10:38 AM Phil Braun wrote: Most Recent Primary Care Visit:  Provider: Clarise Crooks  Department: [facility]  Visit Type: ACUTE  Date: 05/30/2023   Medication:    TRELEGY ELLIPTA 100-62.5-25 MCG/INH AEPB   Has the patient contacted their pharmacy? No     Is this the correct pharmacy for this prescription? Yes   This is the patient's preferred pharmacy:    [facility] [address] [place] - [address] [place] [address] Phone: [phone] Fax: [phone]     Has the prescription been filled recently? No   Is the patient out of the medication? Yes   Has the patient been seen for an appointment in the last year OR does the patient have an upcoming appointment? Yes   Can we respond through MyChart? No   Agent: Please be advised that Rx refills may take up to 3 business days. We ask that you follow-up with your pharmacy.

## 2023-06-21 NOTE — Telephone Encounter (Signed)
 Copied from CRM [id number]. Topic: Clinical - Medication Refill >> Jun 21, 2023 10:38 AM Phil Braun wrote: Most Recent Primary Care Visit:  Provider: Clarise Crooks  Department: [facility]  Visit Type: ACUTE  Date: 05/30/2023  Medication:   TRELEGY ELLIPTA 100-62.5-25 MCG/INH AEPB  Has the patient contacted their pharmacy? No   Is this the correct pharmacy for this prescription? Yes   This is the patient's preferred pharmacy:   [facility] [address] [place] - [address] [place] [address] Phone: [phone] Fax: [phone]   Has the prescription been filled recently? No  Is the patient out of the medication? Yes  Has the patient been seen for an appointment in the last year OR does the patient have an upcoming appointment? Yes  Can we respond through MyChart? No  Agent: Please be advised that Rx refills may take up to 3 business days. We ask that you follow-up with your pharmacy.

## 2023-06-22 NOTE — Telephone Encounter (Signed)
 Requested medication (s) are due for refill today - per patient call- yes  Requested medication (s) are on the active medication list -yes  Future visit scheduled -yes  Last refill: unknown  Notes to clinic: medication listed as historical medication, off protocol- provider review   Requested Prescriptions  Pending Prescriptions Disp Refills   Fluticasone-Umeclidin-Vilant (TRELEGY ELLIPTA) 100-62.5-25 MCG/ACT AEPB      Sig: Inhale 1 puff into the lungs daily.     Off-Protocol Failed - 06/22/2023  8:54 AM      Failed - Medication not assigned to a protocol, review manually.      Passed - Valid encounter within last 12 months    Recent Outpatient Visits           3 weeks ago Primary hypertension   [facility]   2 months ago Primary hypertension   [place] [facility]       Future Appointments             In 5 days Clarise Crooks, MD [facility] at [facility]   In 3 weeks Clarise Crooks, MD [facility] at [facility], [facility]               Requested Prescriptions  Pending Prescriptions Disp Refills   Fluticasone-Umeclidin-Vilant (TRELEGY ELLIPTA) 100-62.5-25 MCG/ACT AEPB      Sig: Inhale 1 puff into the lungs daily.     Off-Protocol Failed - 06/22/2023  8:54 AM      Failed - Medication not assigned to a protocol, review manually.      Passed - Valid encounter within last 12 months    Recent Outpatient Visits           3 weeks ago Primary hypertension   [place] [facility]   2 months ago Primary hypertension   [place] [facility]       Future Appointments             In 5 days Clarise Crooks, MD [facility]  [facility] at [facility], [facility]   In 3 weeks Clarise Crooks, MD [facility] at [facility], [facility]

## 2023-06-27 ENCOUNTER — Encounter: Payer: Self-pay | Admitting: Family Medicine

## 2023-06-27 ENCOUNTER — Ambulatory Visit: Admitting: Family Medicine

## 2023-07-03 ENCOUNTER — Other Ambulatory Visit: Payer: Self-pay

## 2023-07-03 ENCOUNTER — Ambulatory Visit: Payer: Self-pay

## 2023-07-03 DIAGNOSIS — J441 Chronic obstructive pulmonary disease with (acute) exacerbation: Secondary | ICD-10-CM

## 2023-07-03 MED ORDER — TRELEGY ELLIPTA 100-62.5-25 MCG/ACT IN AEPB
1.0000 | INHALATION_SPRAY | Freq: Every day | RESPIRATORY_TRACT | 11 refills | Status: DC
Start: 2023-07-03 — End: 2023-12-27

## 2023-07-03 NOTE — Telephone Encounter (Signed)
 Chief Complaint: SOB Symptoms: abdominal/bilateral hands/wrists/legs swelling, SOB, cough Frequency: x 2 months Pertinent Negatives: Patient denies chest pain Disposition: [] ED /[] Urgent Care (no appt availability in office) / [x] Appointment(In office/virtual)/ []  [place] [facility]/ [] Home Care/ [x] Refused Recommended Disposition /[] [place] [facility]/ []  Follow-up with PCP Additional Notes: Patient states he has his 2 "small inhalers" (doesn't know the names) and states they don't last long enough. Patient states the last time he had swelling was in the 1980s and the swelling has returned recently. Patient asking for the Trelegy Ellipta refill. Offered patient acute visits today with PCP and he states he has no way of transportation to the office. He declined, advised urgent care or ED and he declined. Patient states he would just like his inhaler sent to the pharmacy.  Copied from CRM [id number]. Topic: Clinical - Red Word Triage >> Jul 03, 2023 12:08 PM Crispin Dolphin wrote: Red Word that prompted transfer to Nurse Triage: trouble breathing, unable to catch breath. Needs stronger inhlaer Reason for Disposition  [1] Longstanding difficulty breathing AND [2] not responding to usual therapy  Answer Assessment - Initial Assessment Questions 1. RESPIRATORY STATUS: "Describe your breathing?" (e.g., wheezing, shortness of breath, unable to speak, severe coughing)      Wheezing he states just started, "like my throat won't ease, I'll cough and it comes right back again". SOB.  2. ONSET: "When did this breathing problem begin?"      X 2 months.  3. PATTERN "Does the difficult breathing come and go, or has it been constant since it started?"      Comes and goes, gets better temporarily after inhaler use.  4. SEVERITY: "How bad is your breathing?" (e.g., mild, moderate, severe)    - MILD: No SOB at rest, mild SOB with walking, speaks normally in sentences, can lie down, no retractions, pulse <  100.    - MODERATE: SOB at rest, SOB with minimal exertion and prefers to sit, cannot lie down flat, speaks in phrases, mild retractions, audible wheezing, pulse 100-120.    - SEVERE: Very SOB at rest, speaks in single words, struggling to breathe, sitting hunched forward, retractions, pulse > 120      No labored breathing or wheezing noted, patient states he just used his inhaler about 30 minutes ago. He states when he lies down or standing up its bad.  5. RECURRENT SYMPTOM: "Have you had difficulty breathing before?" If Yes, ask: "When was the last time?" and "What happened that time?"      He states its all the time.  6. CARDIAC HISTORY: "Do you have any history of heart disease?" (e.g., heart attack, angina, bypass surgery, angioplasty)      CHF.  7. LUNG HISTORY: "Do you have any history of lung disease?"  (e.g., pulmonary embolus, asthma, emphysema)     COPD.  8. CAUSE: "What do you think is causing the breathing problem?"      He states it feels like his COPD and he needs a different inhaler. He states he still smokes and he thinks he has been smoking too much and drinking too much lately.  9. OTHER SYMPTOMS: "Do you have any other symptoms? (e.g., dizziness, runny nose, cough, chest pain, fever)    Abdominal, legs, hands/wrist swelling x 2 months, cough x few days  10. O2 SATURATION MONITOR:  "Do you use an oxygen saturation monitor (pulse oximeter) at home?" If Yes, ask: "What is your reading (oxygen level) today?" "What is your  usual oxygen saturation reading?" (e.g., 95%)       Denies owning one.  11. PREGNANCY: "Is there any chance you are pregnant?" "When was your last menstrual period?"       N/A.  12. TRAVEL: "Have you traveled out of the country in the last month?" (e.g., travel history, exposures)       Denies.  Protocols used: Breathing Difficulty-A-AH

## 2023-07-17 ENCOUNTER — Encounter: Payer: Self-pay | Admitting: Family Medicine

## 2023-07-17 ENCOUNTER — Ambulatory Visit (INDEPENDENT_AMBULATORY_CARE_PROVIDER_SITE_OTHER): Payer: Self-pay | Admitting: Family Medicine

## 2023-07-17 VITALS — BP 152/90 | HR 94 | Ht 69.0 in | Wt 188.0 lb

## 2023-07-17 DIAGNOSIS — I10 Essential (primary) hypertension: Secondary | ICD-10-CM

## 2023-07-17 MED ORDER — CHLORTHALIDONE 25 MG PO TABS: 25.0000 mg | ORAL_TABLET | Freq: Every day | ORAL | 0 refills | Status: DC

## 2023-07-17 NOTE — Progress Notes (Signed)
 Date:  07/17/2023   Name:  Daniel Frye   DOB:  March 28, 1950   MRN:  [id number]   Chief Complaint: Hypertension (Patient presents today to follow up on his HTN. He is doing well today. )  Hypertension    Lab Results  Component Value Date   NA 137 05/30/2023   K 4.3 05/30/2023   CO2 21 05/30/2023   GLUCOSE 93 05/30/2023   BUN 17 05/30/2023   CREATININE 1.53 (H) 05/30/2023   CALCIUM 9.4 05/30/2023   EGFR 47 (L) 05/30/2023   GFRNONAA >60 10/09/2019   No results found for: "CHOL", "HDL", "LDLCALC", "LDLDIRECT", "TRIG", "CHOLHDL" Lab Results  Component Value Date   TSH 0.476 08/25/2020   Lab Results  Component Value Date   HGBA1C 5.5 02/16/2023   Lab Results  Component Value Date   WBC 6.7 05/30/2023   HGB 13.9 05/30/2023   HCT 41.7 05/30/2023   MCV 95 05/30/2023   PLT 176 05/30/2023   Lab Results  Component Value Date   ALT 36 05/30/2023   AST 52 (H) 05/30/2023   ALKPHOS 131 (H) 05/30/2023   BILITOT 0.4 05/30/2023   Lab Results  Component Value Date   VD25OH 11.1 (L) 01/26/2015     Review of Systems  Patient Active Problem List   Diagnosis Date Noted   Polyp of transverse colon    Chronic pain syndrome 01/14/2020   Pharmacologic therapy 01/14/2020   Disorder of skeletal system 01/14/2020   Problems influencing health status 01/14/2020   History of substance use disorder 01/14/2020   Abnormal drug screens 01/14/2020   History of alcohol abuse 01/14/2020   History of cocaine use 01/14/2020   History of marijuana use 01/14/2020   History of tobacco abuse 01/14/2020   Acute anaphylaxis 07/31/2019   Acute respiratory failure with hypoxia (HCC) 07/30/2019   DDD (degenerative disc disease), lumbar 01/07/2019   Cervical spondylosis with myelopathy 08/01/2018   Neck pain 05/30/2018   Degenerative cervical spinal stenosis 05/29/2018   Aortic atherosclerosis (HCC) 05/15/2018   Depression 04/01/2018   Numbness and tingling in both hands 03/13/2018    Encounter for long-term (current) use of high-risk medication 08/09/2017   Primary osteoarthritis of both knees 08/09/2017   Hypokalemia 03/03/2016   Hypomagnesemia 03/03/2016   Substance induced mood disorder (HCC) 03/02/2016   Alcohol withdrawal (HCC) 02/29/2016   Crack cocaine use 01/22/2016   Obstructive sleep apnea of adult 03/30/2015   Edema of foot 03/30/2015   Chronic diastolic CHF (congestive heart failure), NYHA class 2 (HCC) 03/30/2015   Apnea, sleep 03/30/2015   Alcohol abuse 03/23/2015   Noncompliance 03/23/2015   Vitamin D  deficiency 01/27/2015   Diabetes mellitus type 2, controlled, without complications (HCC) 12/03/2014   Obesity (BMI 30.0-34.9) 11/26/2014   COPD (chronic obstructive pulmonary disease) (HCC) 06/22/2014   Essential (primary) hypertension 06/22/2014   Rheumatoid arthritis involving multiple sites (HCC) 06/22/2014   Tobacco abuse 06/22/2014   Chronic obstructive pulmonary disease with acute exacerbation (HCC) 06/22/2014   History of drug abuse (HCC) 03/05/2009   SOB (shortness of breath) on exertion 03/05/2009   Alcohol abuse, daily use 03/05/2009   Cocaine dependence, abuse (HCC) 03/05/2009   Fever 03/05/2009   Wheezing 03/05/2009    Allergies  Allergen Reactions   Renflexis [Infliximab] Anaphylaxis    Past Surgical History:  Procedure Laterality Date   BACK SURGERY     COLONOSCOPY WITH PROPOFOL  N/A 12/31/2020   Procedure: COLONOSCOPY WITH PROPOFOL ;  Surgeon: Marnee Sink, MD;  Location: [facility] ENDOSCOPY;  Service: Endoscopy;  Laterality: N/A;   heart surgery to drain fluid N/A 03/07/1985   Heart surgery to remove fluid     Social History   Tobacco Use   Smoking status: Every Day    Current packs/day: 0.25    Average packs/day: 0.3 packs/day for 51.0 years (12.8 ttl pk-yrs)    Types: Cigarettes, Cigars   Smokeless tobacco: Never   Tobacco comments:    Wants to quit no quit date set  Vaping Use   Vaping status: Never Used  Substance  Use Topics   Alcohol use: Yes    Alcohol/week: 49.0 standard drinks of alcohol    Types: 49 Standard drinks or equivalent per week    Comment: Drinks 1/5 day of wine    Drug use: Not Currently    Comment: no drugs in 1 year and 3 months     Medication list has been reviewed and updated.  Current Meds  Medication Sig   albuterol  (PROAIR  HFA) 108 (90 Base) MCG/ACT inhaler Inhale 2 puffs into the lungs every 6 (six) hours as needed. INHALE 2 PUFFS BY MOUTH EVERY 4 HOURS AS NEEDED FOR  WHEEZING  OR  SHORTNESS  OF  BREATH   chlorthalidone (HYGROTON) 25 MG tablet Take 1 tablet (25 mg total) by mouth daily.   Fluticasone -Umeclidin-Vilant (TRELEGY ELLIPTA ) 100-62.5-25 MCG/ACT AEPB Inhale 1 puff into the lungs daily.   Fluticasone -Umeclidin-Vilant (TRELEGY ELLIPTA ) 100-62.5-25 MCG/ACT AEPB Inhale 1 puff into the lungs daily.   furosemide  (LASIX ) 20 MG tablet Take 1 tablet (20 mg total) by mouth daily.   mirtazapine  (REMERON  SOL-TAB) 30 MG disintegrating tablet Take 1 tablet (30 mg total) by mouth at bedtime.   [DISCONTINUED] hydrochlorothiazide  (HYDRODIURIL ) 12.5 MG tablet Take 1 tablet (12.5 mg total) by mouth daily.       05/30/2023   10:54 AM 04/17/2023    1:29 PM 03/02/2023    1:57 PM 02/16/2023    1:27 PM  GAD 7 : Generalized Anxiety Score  Nervous, Anxious, on Edge 1 1 3  0  Control/stop worrying 1 1 3  0  Worry too much - different things 1 2 3 1   Trouble relaxing 0 0 3 1  Restless 0 1 3 0  Easily annoyed or irritable 1 0 3 1  Afraid - awful might happen 0 1 3 0  Total GAD 7 Score 4 6 21 3   Anxiety Difficulty Not difficult at all Somewhat difficult Extremely difficult Not difficult at all       05/30/2023   10:51 AM 04/17/2023    1:29 PM 03/02/2023    1:56 PM  Depression screen PHQ 2/9  Decreased Interest 1 1 1   Down, Depressed, Hopeless 1 3 3   PHQ - 2 Score 2 4 4   Altered sleeping 0 1 3  Tired, decreased energy 3 2 3   Change in appetite 0 0 3  Feeling bad or failure  about yourself  1 1 3   Trouble concentrating 1 1 3   Moving slowly or fidgety/restless 0 1 3  Suicidal thoughts 0 1 3  PHQ-9 Score 7 11 25   Difficult doing work/chores Somewhat difficult Somewhat difficult Extremely dIfficult    BP Readings from Last 3 Encounters:  07/17/23 (!) 152/90  05/30/23 124/80  04/17/23 128/74    Physical Exam Vitals and nursing note reviewed.  Constitutional:      Appearance: He is well-developed.  HENT:     Head: Normocephalic and atraumatic.  Right Ear: Tympanic membrane, ear canal and external ear normal.     Left Ear: Tympanic membrane, ear canal and external ear normal.     Nose: Nose normal.     Mouth/Throat:     Mouth: Mucous membranes are moist.     Dentition: Normal dentition.  Eyes:     General: Lids are normal. No scleral icterus.    Conjunctiva/sclera: Conjunctivae normal.     Pupils: Pupils are equal, round, and reactive to light.  Neck:     Thyroid : No thyromegaly.     Vascular: No carotid bruit, hepatojugular reflux or JVD.     Trachea: No tracheal deviation.  Cardiovascular:     Rate and Rhythm: Normal rate and regular rhythm.     Heart sounds: Normal heart sounds. No murmur heard.    No gallop.  Pulmonary:     Effort: Pulmonary effort is normal.     Breath sounds: Normal breath sounds. No wheezing, rhonchi or rales.  Abdominal:     General: Bowel sounds are normal.     Palpations: Abdomen is soft. There is no hepatomegaly, splenomegaly or mass.     Tenderness: There is no abdominal tenderness. There is no right CVA tenderness or left CVA tenderness.     Hernia: There is no hernia in the left inguinal area.  Musculoskeletal:        General: Normal range of motion.     Cervical back: Normal range of motion and neck supple.  Lymphadenopathy:     Cervical: No cervical adenopathy.  Skin:    General: Skin is warm and dry.     Findings: No rash.  Neurological:     Mental Status: He is alert and oriented to person, place, and  time.     Sensory: No sensory deficit.     Deep Tendon Reflexes: Reflexes are normal and symmetric.  Psychiatric:        Mood and Affect: Mood is not anxious or depressed.     Wt Readings from Last 3 Encounters:  07/17/23 188 lb (85.3 kg)  05/30/23 187 lb (84.8 kg)  04/17/23 183 lb (83 kg)    BP (!) 152/90 (BP Location: Left Arm, Cuff Size: Large)   Pulse 94   Ht 5\' 9"  (1.753 m)   Wt 188 lb (85.3 kg)   SpO2 98%   BMI 27.76 kg/m   Assessment and Plan: 1. Primary hypertension (Primary) Chronic.  Uncontrolled.  Stable.  Blood pressure 152/90 on second reading.  Sometimes I question the compliance but patient does state that he definitely took his medication around 11:00 this morning.  My concern is that we do not have a control and is only on hydrochlorothiazide  12.5 mg so we have discontinued this and replaced it with chlorthalidone 25 mg because he is continued on Lasix  for peripheral edema as well we will recheck patient in about 10 days to see if there is some improvement with the replacement of hydrochlorothiazide  with chlorthalidone and if so we will continue with this dosing.  At this time we will not do lab work because patient begged not to have this done today however we we will do lab work on next visit to assess electrolytes and GFR. - chlorthalidone (HYGROTON) 25 MG tablet; Take 1 tablet (25 mg total) by mouth daily.  Dispense: 30 tablet; Refill: 0     Alayne Allis, MD

## 2023-07-25 ENCOUNTER — Ambulatory Visit (INDEPENDENT_AMBULATORY_CARE_PROVIDER_SITE_OTHER): Admitting: Family Medicine

## 2023-07-25 ENCOUNTER — Encounter: Payer: Self-pay | Admitting: Family Medicine

## 2023-07-25 VITALS — BP 118/82 | HR 77 | Ht 69.0 in | Wt 187.6 lb

## 2023-07-25 DIAGNOSIS — I10 Essential (primary) hypertension: Secondary | ICD-10-CM | POA: Diagnosis not present

## 2023-07-25 DIAGNOSIS — J441 Chronic obstructive pulmonary disease with (acute) exacerbation: Secondary | ICD-10-CM | POA: Diagnosis not present

## 2023-07-25 NOTE — Progress Notes (Signed)
 Date:  07/25/2023   Name:  Daniel Frye   DOB:  1950-11-11   MRN:  [id number]   Chief Complaint: Hypertension (Patient returns today to check his b/p. )  Hypertension This is a chronic problem. The current episode started more than 1 year ago. The problem has been gradually improving since onset. The problem is controlled. Pertinent negatives include no blurred vision, chest pain, headaches, orthopnea, palpitations, PND, shortness of breath or sweats. The current treatment provides moderate improvement.    Lab Results  Component Value Date   NA 137 05/30/2023   K 4.3 05/30/2023   CO2 21 05/30/2023   GLUCOSE 93 05/30/2023   BUN 17 05/30/2023   CREATININE 1.53 (H) 05/30/2023   CALCIUM 9.4 05/30/2023   EGFR 47 (L) 05/30/2023   GFRNONAA >60 10/09/2019   No results found for: "CHOL", "HDL", "LDLCALC", "LDLDIRECT", "TRIG", "CHOLHDL" Lab Results  Component Value Date   TSH 0.476 08/25/2020   Lab Results  Component Value Date   HGBA1C 5.5 02/16/2023   Lab Results  Component Value Date   WBC 6.7 05/30/2023   HGB 13.9 05/30/2023   HCT 41.7 05/30/2023   MCV 95 05/30/2023   PLT 176 05/30/2023   Lab Results  Component Value Date   ALT 36 05/30/2023   AST 52 (H) 05/30/2023   ALKPHOS 131 (H) 05/30/2023   BILITOT 0.4 05/30/2023   Lab Results  Component Value Date   VD25OH 11.1 (L) 01/26/2015     Review of Systems  Eyes:  Negative for blurred vision and visual disturbance.  Respiratory:  Negative for cough, choking, chest tightness, shortness of breath, wheezing and stridor.   Cardiovascular:  Negative for chest pain, palpitations, orthopnea, leg swelling and PND.  Neurological:  Negative for headaches.    Patient Active Problem List   Diagnosis Date Noted   Polyp of transverse colon    Chronic pain syndrome 01/14/2020   Pharmacologic therapy 01/14/2020   Disorder of skeletal system 01/14/2020   Problems influencing health status 01/14/2020   History of substance  use disorder 01/14/2020   Abnormal drug screens 01/14/2020   History of alcohol abuse 01/14/2020   History of cocaine use 01/14/2020   History of marijuana use 01/14/2020   History of tobacco abuse 01/14/2020   Acute anaphylaxis 07/31/2019   Acute respiratory failure with hypoxia (HCC) 07/30/2019   DDD (degenerative disc disease), lumbar 01/07/2019   Cervical spondylosis with myelopathy 08/01/2018   Neck pain 05/30/2018   Degenerative cervical spinal stenosis 05/29/2018   Aortic atherosclerosis (HCC) 05/15/2018   Depression 04/01/2018   Numbness and tingling in both hands 03/13/2018   Encounter for long-term (current) use of high-risk medication 08/09/2017   Primary osteoarthritis of both knees 08/09/2017   Hypokalemia 03/03/2016   Hypomagnesemia 03/03/2016   Substance induced mood disorder (HCC) 03/02/2016   Alcohol withdrawal (HCC) 02/29/2016   Crack cocaine use 01/22/2016   Obstructive sleep apnea of adult 03/30/2015   Edema of foot 03/30/2015   Chronic diastolic CHF (congestive heart failure), NYHA class 2 (HCC) 03/30/2015   Apnea, sleep 03/30/2015   Alcohol abuse 03/23/2015   Noncompliance 03/23/2015   Vitamin D  deficiency 01/27/2015   Diabetes mellitus type 2, controlled, without complications (HCC) 12/03/2014   Obesity (BMI 30.0-34.9) 11/26/2014   COPD (chronic obstructive pulmonary disease) (HCC) 06/22/2014   Essential (primary) hypertension 06/22/2014   Rheumatoid arthritis involving multiple sites (HCC) 06/22/2014   Tobacco abuse 06/22/2014   Chronic obstructive pulmonary disease with  acute exacerbation (HCC) 06/22/2014   History of drug abuse (HCC) 03/05/2009   SOB (shortness of breath) on exertion 03/05/2009   Alcohol abuse, daily use 03/05/2009   Cocaine dependence, abuse (HCC) 03/05/2009   Fever 03/05/2009   Wheezing 03/05/2009    Allergies  Allergen Reactions   Renflexis [Infliximab] Anaphylaxis    Past Surgical History:  Procedure Laterality Date    BACK SURGERY     COLONOSCOPY WITH PROPOFOL  N/A 12/31/2020   Procedure: COLONOSCOPY WITH PROPOFOL ;  Surgeon: Marnee Sink, MD;  Location: [facility] ENDOSCOPY;  Service: Endoscopy;  Laterality: N/A;   heart surgery to drain fluid N/A 03/07/1985   Heart surgery to remove fluid     Social History   Tobacco Use   Smoking status: Every Day    Current packs/day: 0.25    Average packs/day: 0.3 packs/day for 51.0 years (12.8 ttl pk-yrs)    Types: Cigarettes, Cigars   Smokeless tobacco: Never   Tobacco comments:    Wants to quit no quit date set  Vaping Use   Vaping status: Never Used  Substance Use Topics   Alcohol use: Yes    Alcohol/week: 49.0 standard drinks of alcohol    Types: 49 Standard drinks or equivalent per week    Comment: Drinks 1/5 day of wine    Drug use: Not Currently    Comment: no drugs in 1 year and 3 months     Medication list has been reviewed and updated.  Current Meds  Medication Sig   albuterol  (PROAIR  HFA) 108 (90 Base) MCG/ACT inhaler Inhale 2 puffs into the lungs every 6 (six) hours as needed. INHALE 2 PUFFS BY MOUTH EVERY 4 HOURS AS NEEDED FOR  WHEEZING  OR  SHORTNESS  OF  BREATH   chlorthalidone  (HYGROTON ) 25 MG tablet Take 1 tablet (25 mg total) by mouth daily.   Fluticasone -Umeclidin-Vilant (TRELEGY ELLIPTA ) 100-62.5-25 MCG/ACT AEPB Inhale 1 puff into the lungs daily.   Fluticasone -Umeclidin-Vilant (TRELEGY ELLIPTA ) 100-62.5-25 MCG/ACT AEPB Inhale 1 puff into the lungs daily.   folic acid  (FOLVITE ) 1 MG tablet Take 1 mg by mouth daily.   furosemide  (LASIX ) 20 MG tablet Take 1 tablet (20 mg total) by mouth daily.   Melatonin 3 MG CAPS Take 1 capsule by mouth at bedtime.   mirtazapine  (REMERON  SOL-TAB) 30 MG disintegrating tablet Take 1 tablet (30 mg total) by mouth at bedtime.   nicotine  polacrilex (NICORETTE) 4 MG gum Take 4 mg by mouth as needed for smoking cessation (every 2 hours).   polyethylene glycol (MIRALAX / GLYCOLAX) 17 g packet Take 17 g by mouth  daily.   thiamine  (VITAMIN B1) 100 MG tablet Take 200 mg by mouth daily.       07/25/2023    1:52 PM 05/30/2023   10:54 AM 04/17/2023    1:29 PM 03/02/2023    1:57 PM  GAD 7 : Generalized Anxiety Score  Nervous, Anxious, on Edge 1 1 1 3   Control/stop worrying 0 1 1 3   Worry too much - different things 1 1 2 3   Trouble relaxing 0 0 0 3  Restless 0 0 1 3  Easily annoyed or irritable 1 1 0 3  Afraid - awful might happen 1 0 1 3  Total GAD 7 Score 4 4 6 21   Anxiety Difficulty Somewhat difficult Not difficult at all Somewhat difficult Extremely difficult       07/25/2023    1:52 PM 05/30/2023   10:51 AM 04/17/2023  1:29 PM  Depression screen PHQ 2/9  Decreased Interest 2 1 1   Down, Depressed, Hopeless 1 1 3   PHQ - 2 Score 3 2 4   Altered sleeping 0 0 1  Tired, decreased energy 1 3 2   Change in appetite 1 0 0  Feeling bad or failure about yourself  2 1 1   Trouble concentrating 2 1 1   Moving slowly or fidgety/restless 1 0 1  Suicidal thoughts 1 0 1  PHQ-9 Score 11 7 11   Difficult doing work/chores Extremely dIfficult Somewhat difficult Somewhat difficult    BP Readings from Last 3 Encounters:  07/25/23 118/82  07/17/23 (!) 152/90  05/30/23 124/80    Physical Exam Vitals and nursing note reviewed.  Constitutional:      Appearance: He is well-developed.  HENT:     Head: Normocephalic and atraumatic.     Right Ear: Tympanic membrane, ear canal and external ear normal.     Left Ear: Tympanic membrane, ear canal and external ear normal.     Nose: Nose normal.     Mouth/Throat:     Dentition: Normal dentition.  Eyes:     General: Lids are normal. No scleral icterus.    Conjunctiva/sclera: Conjunctivae normal.     Pupils: Pupils are equal, round, and reactive to light.  Neck:     Thyroid : No thyromegaly.     Vascular: Normal carotid pulses. No carotid bruit, hepatojugular reflux or JVD.     Trachea: No tracheal deviation.  Cardiovascular:     Rate and Rhythm: Normal  rate and regular rhythm.     Chest Wall: PMI is not displaced.     Heart sounds: Normal heart sounds, S1 normal and S2 normal. No murmur heard.    No systolic murmur is present.     No diastolic murmur is present.     No gallop. No S3 or S4 sounds.  Pulmonary:     Effort: Pulmonary effort is normal.     Breath sounds: Normal breath sounds. No wheezing, rhonchi or rales.  Abdominal:     General: Bowel sounds are normal.     Palpations: Abdomen is soft. There is no hepatomegaly, splenomegaly or mass.     Tenderness: There is no abdominal tenderness.     Hernia: There is no hernia in the left inguinal area.  Musculoskeletal:        General: Normal range of motion.     Cervical back: Normal range of motion and neck supple.  Lymphadenopathy:     Cervical: No cervical adenopathy.  Skin:    General: Skin is warm and dry.     Findings: No rash.  Neurological:     Mental Status: He is alert and oriented to person, place, and time.     Sensory: No sensory deficit.     Deep Tendon Reflexes: Reflexes are normal and symmetric.  Psychiatric:        Mood and Affect: Mood is not anxious or depressed.     Wt Readings from Last 3 Encounters:  07/25/23 187 lb 9.6 oz (85.1 kg)  07/17/23 188 lb (85.3 kg)  05/30/23 187 lb (84.8 kg)    BP 118/82   Pulse 77   Ht 5\' 9"  (1.753 m)   Wt 187 lb 9.6 oz (85.1 kg)   SpO2 99%   BMI 27.70 kg/m   Assessment and Plan:  1. Primary hypertension (Primary) Chronic.  Controlled.  Stable.  Blood pressure today is 118/82.  Asymptomatic.  Tolerating blood pressure well.  Will continue at current dosings of  Chlorthalidone  Alythia 25 mg once a day and furosemide  20 mg once a day - Brain natriuretic peptide; normal - Renal Function Panel  2. Chronic obstructive pulmonary disease with acute exacerbation (HCC) Chronic.  Controlled.  Stable.  Patient is doing well with current rate albuterol  inhaler 1 to 2 puffs every 6 hours.  Will recheck patient in 4  months - Brain natriuretic peptide; normal - Renal Function Panel    Alayne Allis, MD

## 2023-07-26 DIAGNOSIS — N179 Acute kidney failure, unspecified: Secondary | ICD-10-CM | POA: Diagnosis not present

## 2023-07-26 DIAGNOSIS — W19XXXA Unspecified fall, initial encounter: Secondary | ICD-10-CM | POA: Diagnosis not present

## 2023-07-26 DIAGNOSIS — I130 Hypertensive heart and chronic kidney disease with heart failure and stage 1 through stage 4 chronic kidney disease, or unspecified chronic kidney disease: Secondary | ICD-10-CM | POA: Diagnosis not present

## 2023-07-26 DIAGNOSIS — M069 Rheumatoid arthritis, unspecified: Secondary | ICD-10-CM | POA: Diagnosis not present

## 2023-07-26 DIAGNOSIS — I5030 Unspecified diastolic (congestive) heart failure: Secondary | ICD-10-CM | POA: Diagnosis not present

## 2023-07-26 DIAGNOSIS — F17210 Nicotine dependence, cigarettes, uncomplicated: Secondary | ICD-10-CM | POA: Diagnosis not present

## 2023-07-26 DIAGNOSIS — J449 Chronic obstructive pulmonary disease, unspecified: Secondary | ICD-10-CM | POA: Diagnosis not present

## 2023-07-26 DIAGNOSIS — E1122 Type 2 diabetes mellitus with diabetic chronic kidney disease: Secondary | ICD-10-CM | POA: Diagnosis not present

## 2023-07-26 DIAGNOSIS — S0003XA Contusion of scalp, initial encounter: Secondary | ICD-10-CM | POA: Diagnosis not present

## 2023-07-26 DIAGNOSIS — Z66 Do not resuscitate: Secondary | ICD-10-CM | POA: Diagnosis not present

## 2023-07-26 DIAGNOSIS — Z043 Encounter for examination and observation following other accident: Secondary | ICD-10-CM | POA: Diagnosis not present

## 2023-07-26 DIAGNOSIS — M4802 Spinal stenosis, cervical region: Secondary | ICD-10-CM | POA: Diagnosis not present

## 2023-07-26 DIAGNOSIS — D820 Wiskott-Aldrich syndrome: Secondary | ICD-10-CM | POA: Diagnosis not present

## 2023-07-26 DIAGNOSIS — I5032 Chronic diastolic (congestive) heart failure: Secondary | ICD-10-CM | POA: Diagnosis not present

## 2023-07-26 DIAGNOSIS — N189 Chronic kidney disease, unspecified: Secondary | ICD-10-CM | POA: Diagnosis not present

## 2023-07-26 DIAGNOSIS — R58 Hemorrhage, not elsewhere classified: Secondary | ICD-10-CM | POA: Diagnosis not present

## 2023-07-26 DIAGNOSIS — S0101XA Laceration without foreign body of scalp, initial encounter: Secondary | ICD-10-CM | POA: Diagnosis not present

## 2023-07-26 LAB — RENAL FUNCTION PANEL
Albumin: 4 g/dL (ref 3.8–4.8)
BUN/Creatinine Ratio: 11 (ref 10–24)
BUN: 16 mg/dL (ref 8–27)
CO2: 19 mmol/L — ABNORMAL LOW (ref 20–29)
Calcium: 9.3 mg/dL (ref 8.6–10.2)
Chloride: 102 mmol/L (ref 96–106)
Creatinine, Ser: 1.42 mg/dL — ABNORMAL HIGH (ref 0.76–1.27)
Glucose: 87 mg/dL (ref 70–99)
Phosphorus: 2.8 mg/dL (ref 2.8–4.1)
Potassium: 4.1 mmol/L (ref 3.5–5.2)
Sodium: 139 mmol/L (ref 134–144)
eGFR: 52 mL/min/{1.73_m2} — ABNORMAL LOW (ref >59)

## 2023-07-27 DIAGNOSIS — S0101XA Laceration without foreign body of scalp, initial encounter: Secondary | ICD-10-CM | POA: Diagnosis not present

## 2023-07-27 DIAGNOSIS — Z043 Encounter for examination and observation following other accident: Secondary | ICD-10-CM | POA: Diagnosis not present

## 2023-07-27 DIAGNOSIS — S0003XA Contusion of scalp, initial encounter: Secondary | ICD-10-CM | POA: Diagnosis not present

## 2023-10-17 ENCOUNTER — Encounter: Payer: Self-pay | Admitting: Family Medicine

## 2023-10-17 ENCOUNTER — Ambulatory Visit: Admitting: Family Medicine

## 2023-10-18 ENCOUNTER — Ambulatory Visit (INDEPENDENT_AMBULATORY_CARE_PROVIDER_SITE_OTHER): Admitting: Family Medicine

## 2023-10-18 ENCOUNTER — Encounter: Payer: Self-pay | Admitting: Family Medicine

## 2023-10-18 VITALS — BP 110/78 | HR 87 | Ht 69.0 in | Wt 171.0 lb

## 2023-10-18 DIAGNOSIS — R630 Anorexia: Secondary | ICD-10-CM

## 2023-10-18 DIAGNOSIS — R634 Abnormal weight loss: Secondary | ICD-10-CM | POA: Diagnosis not present

## 2023-10-18 NOTE — Progress Notes (Signed)
 Established Patient Office Visit  Subjective   Patient ID: Daniel Frye, male    DOB: Sep 14, 1950  Age: 73 y.o. MRN: [id number]  Chief Complaint  Patient presents with   Weight Loss    X1.5 month,unchanged, Loss of appetite, can only drink 3 ensures a day, not eating anything else      Assessment & Plan:   Problem List Items Addressed This Visit   None Visit Diagnoses       Loss of appetite    -  Primary     Weight loss       Relevant Orders   CBC with Differential (Completed)   Comprehensive Metabolic Panel (CMET) (Completed)   TSH Rfx on Abnormal to Free T4 (Completed)   Sed Rate (ESR) (Completed)   Hemoglobin A1c (Completed)   DG Chest 2 View   Microalbumin / creatinine urine ratio   Urinalysis, Routine w reflex microscopic (Completed)      Unclear etiology, will do a weight loss workup as above.  Patient was given a supply of Ensure.   Return in about 4 weeks (around 11/15/2023), or if symptoms worsen or fail to improve.   73 year old male presents for acute visit.  Reports he has some trouble swallowing and decreased appetite for the last 6 weeks.   Has difficulty swallowing food and has no trouble drinking water.  Feels bloated wants to know if he has fluid in his belly due to history of heart failure.  Patient denies any chest pain or shortness of breath, leg swelling.  Has BM regularly. Denies any in the stool or urine.   Denies fever, chills, cough, sweats.    Lab Results      Component                Value               Date                      HGBA1C                   5.5                 02/16/2023             BP Readings from Last 3 Encounters: 10/18/23 : 110/78 07/25/23 : 118/82 07/17/23 : (!) 152/90   Wt Readings from Last 3 Encounters: 10/18/23 : 171 lb (77.6 kg) 07/25/23 : 187 lb 9.6 oz (85.1 kg) 07/17/23 : 188 lb (85.3 kg)        Review of Systems  All other systems reviewed and are negative.     Objective:     BP 110/78    Pulse 87   Ht 5' 9 (1.753 m)   Wt 171 lb (77.6 kg)   SpO2 99%   BMI 25.25 kg/m     Physical Exam Vitals and nursing note reviewed.  Constitutional:      Appearance: Normal appearance.  HENT:     Head: Normocephalic.     Right Ear: External ear normal.     Left Ear: External ear normal.  Eyes:     Conjunctiva/sclera: Conjunctivae normal.  Cardiovascular:     Rate and Rhythm: Normal rate.  Pulmonary:     Effort: Pulmonary effort is normal. No respiratory distress.  Abdominal:     Palpations: Abdomen is soft.  Musculoskeletal:        General: Normal range  of motion.  Skin:    General: Skin is warm.  Neurological:     Mental Status: He is alert and oriented to person, place, and time.  Psychiatric:        Mood and Affect: Mood normal.      Results for orders placed or performed in visit on 10/18/23  Microscopic Examination  Result Value Ref Range   WBC, UA 0-5 0 - 5 /hpf   RBC, Urine 0-2 0 - 2 /hpf   Epithelial Cells (non renal) 0-10 0 - 10 /hpf   Casts None seen None seen /lpf   Bacteria, UA None seen None seen/Few  CBC with Differential  Result Value Ref Range   WBC 8.6 3.4 - 10.8 x10E3/uL   RBC 4.89 4.14 - 5.80 x10E6/uL   Hemoglobin 15.5 13.0 - 17.7 g/dL   Hematocrit 52.3 62.4 - 51.0 %   MCV 97 79 - 97 fL   MCH 31.7 26.6 - 33.0 pg   MCHC 32.6 31.5 - 35.7 g/dL   RDW 87.8 88.3 - 84.5 %   Platelets 195 150 - 450 x10E3/uL   Neutrophils 67 Not Estab. %   Lymphs 21 Not Estab. %   Monocytes 9 Not Estab. %   Eos 2 Not Estab. %   Basos 1 Not Estab. %   Neutrophils Absolute 5.8 1.4 - 7.0 x10E3/uL   Lymphocytes Absolute 1.8 0.7 - 3.1 x10E3/uL   Monocytes Absolute 0.8 0.1 - 0.9 x10E3/uL   EOS (ABSOLUTE) 0.2 0.0 - 0.4 x10E3/uL   Basophils Absolute 0.0 0.0 - 0.2 x10E3/uL   Immature Granulocytes 0 Not Estab. %   Immature Grans (Abs) 0.0 0.0 - 0.1 x10E3/uL  Comprehensive Metabolic Panel (CMET)  Result Value Ref Range   Glucose 87 70 - 99 mg/dL   BUN 26 8 - 27  mg/dL   Creatinine, Ser 8.32 (H) 0.76 - 1.27 mg/dL   eGFR 42 (L) >40 fO/fpw/8.26   BUN/Creatinine Ratio 16 10 - 24   Sodium 131 (L) 134 - 144 mmol/L   Potassium 4.5 3.5 - 5.2 mmol/L   Chloride 92 (L) 96 - 106 mmol/L   CO2 20 20 - 29 mmol/L   Calcium 9.6 8.6 - 10.2 mg/dL   Total Protein 7.8 6.0 - 8.5 g/dL   Albumin 3.9 3.8 - 4.8 g/dL   Globulin, Total 3.9 1.5 - 4.5 g/dL   Bilirubin Total 0.5 0.0 - 1.2 mg/dL   Alkaline Phosphatase 126 (H) 44 - 121 IU/L   AST 40 0 - 40 IU/L   ALT 37 0 - 44 IU/L  TSH Rfx on Abnormal to Free T4  Result Value Ref Range   TSH 1.180 0.450 - 4.500 uIU/mL  Sed Rate (ESR)  Result Value Ref Range   Sed Rate 102 (H) 0 - 30 mm/hr  Hemoglobin A1c  Result Value Ref Range   Hgb A1c MFr Bld 5.6 4.8 - 5.6 %   Est. average glucose Bld gHb Est-mCnc 114 mg/dL  Urinalysis, Routine w reflex microscopic  Result Value Ref Range   Specific Gravity, UA 1.021 1.005 - 1.030   pH, UA 5.5 5.0 - 7.5   Color, UA Yellow Yellow   Appearance Ur Cloudy (A) Clear   Leukocytes,UA 1+ (A) Negative   Protein,UA Negative Negative/Trace   Glucose, UA Negative Negative   Ketones, UA Trace (A) Negative   RBC, UA Negative Negative   Bilirubin, UA Negative Negative   Urobilinogen, Ur 1.0 0.2 - 1.0 mg/dL  Nitrite, UA Negative Negative   Microscopic Examination See below:       The ASCVD Risk score (Arnett DK, et al., 2019) failed to calculate for the following reasons:   Cannot find a previous HDL lab   Cannot find a previous total cholesterol lab      Ladoris MARLA Ny, MD

## 2023-10-19 ENCOUNTER — Telehealth: Payer: Self-pay

## 2023-10-19 ENCOUNTER — Ambulatory Visit: Payer: Self-pay | Admitting: Family Medicine

## 2023-10-19 LAB — CBC WITH DIFFERENTIAL/PLATELET
Basophils Absolute: 0 x10E3/uL (ref 0.0–0.2)
Basos: 1 %
EOS (ABSOLUTE): 0.2 x10E3/uL (ref 0.0–0.4)
Eos: 2 %
Hematocrit: 47.6 % (ref 37.5–51.0)
Hemoglobin: 15.5 g/dL (ref 13.0–17.7)
Immature Grans (Abs): 0 x10E3/uL (ref 0.0–0.1)
Immature Granulocytes: 0 %
Lymphocytes Absolute: 1.8 x10E3/uL (ref 0.7–3.1)
Lymphs: 21 %
MCH: 31.7 pg (ref 26.6–33.0)
MCHC: 32.6 g/dL (ref 31.5–35.7)
MCV: 97 fL (ref 79–97)
Monocytes Absolute: 0.8 x10E3/uL (ref 0.1–0.9)
Monocytes: 9 %
Neutrophils Absolute: 5.8 x10E3/uL (ref 1.4–7.0)
Neutrophils: 67 %
Platelets: 195 x10E3/uL (ref 150–450)
RBC: 4.89 x10E6/uL (ref 4.14–5.80)
RDW: 12.1 % (ref 11.6–15.4)
WBC: 8.6 x10E3/uL (ref 3.4–10.8)

## 2023-10-19 LAB — URINALYSIS, ROUTINE W REFLEX MICROSCOPIC
Bilirubin, UA: NEGATIVE
Glucose, UA: NEGATIVE
Nitrite, UA: NEGATIVE
Protein,UA: NEGATIVE
RBC, UA: NEGATIVE
Specific Gravity, UA: 1.021 (ref 1.005–1.030)
Urobilinogen, Ur: 1 mg/dL (ref 0.2–1.0)
pH, UA: 5.5 (ref 5.0–7.5)

## 2023-10-19 LAB — COMPREHENSIVE METABOLIC PANEL WITH GFR
ALT: 37 IU/L (ref 0–44)
AST: 40 IU/L (ref 0–40)
Albumin: 3.9 g/dL (ref 3.8–4.8)
Alkaline Phosphatase: 126 IU/L — ABNORMAL HIGH (ref 44–121)
BUN/Creatinine Ratio: 16 (ref 10–24)
BUN: 26 mg/dL (ref 8–27)
Bilirubin Total: 0.5 mg/dL (ref 0.0–1.2)
CO2: 20 mmol/L (ref 20–29)
Calcium: 9.6 mg/dL (ref 8.6–10.2)
Chloride: 92 mmol/L — ABNORMAL LOW (ref 96–106)
Creatinine, Ser: 1.67 mg/dL — ABNORMAL HIGH (ref 0.76–1.27)
Globulin, Total: 3.9 g/dL (ref 1.5–4.5)
Glucose: 87 mg/dL (ref 70–99)
Potassium: 4.5 mmol/L (ref 3.5–5.2)
Sodium: 131 mmol/L — ABNORMAL LOW (ref 134–144)
Total Protein: 7.8 g/dL (ref 6.0–8.5)
eGFR: 42 mL/min/1.73 — ABNORMAL LOW (ref >59)

## 2023-10-19 LAB — MICROSCOPIC EXAMINATION
Bacteria, UA: NONE SEEN
Casts: NONE SEEN /LPF

## 2023-10-19 LAB — TSH RFX ON ABNORMAL TO FREE T4: TSH: 1.18 u[IU]/mL (ref 0.450–4.500)

## 2023-10-19 LAB — HEMOGLOBIN A1C
Est. average glucose Bld gHb Est-mCnc: 114 mg/dL
Hgb A1c MFr Bld: 5.6 % (ref 4.8–5.6)

## 2023-10-19 LAB — SEDIMENTATION RATE: Sed Rate: 102 mm/h — ABNORMAL HIGH (ref 0–30)

## 2023-10-19 NOTE — Telephone Encounter (Signed)
 Called pt left VM to get imaging done. Told pt he can go downstairs to get the imaging done.  KP

## 2023-10-19 NOTE — Progress Notes (Signed)
 Your kidney function has worsened, please make sure you hydrate well, your sodium is slightly lower, recommend eating regular diet which has some salt.  Avoid NSAIDs like Advil, Aleve, Motrin, ibuprofen.  Your liver function slightly elevated but lowered when compared to last results. Your marker for inflammation is high, suggesting inflammation.  Your blood count is within normal range with no concern for infection.  Please get the chest x-ray done which was ordered. Your diabetes screening was negative, your thyroid  levels are within normal range.

## 2023-10-20 ENCOUNTER — Telehealth: Payer: Self-pay

## 2023-10-20 ENCOUNTER — Ambulatory Visit
Admission: RE | Admit: 2023-10-20 | Discharge: 2023-10-20 | Disposition: A | Discharge status: Home / Self Care | Attending: Family Medicine | Admitting: Family Medicine

## 2023-10-20 ENCOUNTER — Ambulatory Visit
Admission: RE | Admit: 2023-10-20 | Discharge: 2023-10-20 | Disposition: A | Discharge status: Home / Self Care | Source: Ambulatory Visit | Attending: Family Medicine | Admitting: Family Medicine

## 2023-10-20 DIAGNOSIS — R634 Abnormal weight loss: Secondary | ICD-10-CM

## 2023-10-20 NOTE — Telephone Encounter (Signed)
 Called pt he stated he is going today at 4:00 PM to get his xray done.  KP

## 2023-10-23 ENCOUNTER — Other Ambulatory Visit: Payer: Self-pay | Admitting: Family Medicine

## 2023-10-23 DIAGNOSIS — Z122 Encounter for screening for malignant neoplasm of respiratory organs: Secondary | ICD-10-CM

## 2023-10-23 DIAGNOSIS — F17200 Nicotine dependence, unspecified, uncomplicated: Secondary | ICD-10-CM

## 2023-10-31 ENCOUNTER — Other Ambulatory Visit: Payer: Self-pay | Admitting: Family Medicine

## 2023-10-31 DIAGNOSIS — I10 Essential (primary) hypertension: Secondary | ICD-10-CM

## 2023-10-31 DIAGNOSIS — F32A Depression, unspecified: Secondary | ICD-10-CM

## 2023-10-31 NOTE — Telephone Encounter (Unsigned)
 Copied from CRM #[id number]. Topic: Clinical - Medication Refill >> Oct 31, 2023  1:07 PM Debby BROCKS wrote: Medication: mirtazapine  (REMERON  SOL-TAB) 30 MG disintegrating tablet  Has the patient contacted their pharmacy? No (Agent: If no, request that the patient contact the pharmacy for the refill. If patient does not wish to contact the pharmacy document the reason why and proceed with request.) (Agent: If yes, when and what did the pharmacy advise?)   This is the patient's preferred pharmacy:  [facility] [address] [place] - [address] [place] [address] Phone: [phone] Fax: [phone]  Is this the correct pharmacy for this prescription? Yes If no, delete pharmacy and type the correct one.   Has the prescription been filled recently? No  Is the patient out of the medication? Yes, patient states he lost the bottle  Has the patient been seen for an appointment in the last year OR does the patient have an upcoming appointment? Yes  Can we respond through MyChart? Yes  Agent: Please be advised that Rx refills may take up to 3 business days. We ask that you follow-up with your pharmacy.

## 2023-11-01 ENCOUNTER — Encounter: Payer: Self-pay | Admitting: Family Medicine

## 2023-11-01 ENCOUNTER — Ambulatory Visit: Admitting: Family Medicine

## 2023-11-01 MED ORDER — MIRTAZAPINE 30 MG PO TBDP
30.0000 mg | ORAL_TABLET | Freq: Every day | ORAL | 1 refills | Status: DC
Start: 2023-11-01 — End: 2023-12-27

## 2023-11-01 MED ORDER — CHLORTHALIDONE 25 MG PO TABS: 25.0000 mg | ORAL_TABLET | Freq: Every day | ORAL | 0 refills | Status: DC

## 2023-11-02 NOTE — Telephone Encounter (Signed)
 Spoke with patient, made him aware rx was sent to pharmacy on 11/01/2023. Patient verbalized understanding.

## 2023-11-02 NOTE — Telephone Encounter (Unsigned)
 Copied from CRM #[id number]. Topic: Clinical - Prescription Issue >> Nov 02, 2023  9:51 AM Ivette P wrote: Reason for CRM: Pt is calling in about medication mirtazapine  (REMERON  SOL-TAB) 30 MG disintegrating tablet. Pt put in request on 08/31/2023 and would ike to know if will be approved or does he need something else.   Pls fllw up with pt

## 2023-11-07 ENCOUNTER — Encounter: Payer: Self-pay | Admitting: Family Medicine

## 2023-11-07 ENCOUNTER — Ambulatory Visit (INDEPENDENT_AMBULATORY_CARE_PROVIDER_SITE_OTHER): Admitting: Family Medicine

## 2023-11-07 VITALS — BP 118/70 | HR 71 | Ht 69.0 in | Wt 172.0 lb

## 2023-11-07 DIAGNOSIS — R630 Anorexia: Secondary | ICD-10-CM | POA: Diagnosis not present

## 2023-11-07 DIAGNOSIS — H6993 Unspecified Eustachian tube disorder, bilateral: Secondary | ICD-10-CM | POA: Diagnosis not present

## 2023-11-07 DIAGNOSIS — R634 Abnormal weight loss: Secondary | ICD-10-CM | POA: Diagnosis not present

## 2023-11-07 DIAGNOSIS — R1310 Dysphagia, unspecified: Secondary | ICD-10-CM

## 2023-11-07 MED ORDER — FLUTICASONE PROPIONATE 50 MCG/ACT NA SUSP: 2.0000 | Freq: Every day | NASAL | 6 refills | Status: AC

## 2023-11-07 NOTE — Progress Notes (Signed)
 Established Patient Office Visit  Subjective   Patient ID: Daniel Frye, male    DOB: [birth year]  Age: 73 y.o. MRN: [id number]  Chief Complaint  Patient presents with   Anorexia    Patient states he has been having loss of appetite for a couple of months    Ear Fullness    For a couple of weeks, patient reports not hearing very well      Assessment & Plan:   Problem List Items Addressed This Visit   None Visit Diagnoses       Dysphagia, unspecified type    -  Primary   Relevant Orders   Ambulatory referral to Gastroenterology     Loss of appetite       Relevant Orders   Ambulatory referral to Gastroenterology     Weight loss       Relevant Orders   Ambulatory referral to Gastroenterology     Assessment and Plan Assessment & Plan Dysphagia and anorexia Difficulty swallowing solids and decreased appetite without nausea, vomiting. Symptoms persistent for months. - Refer to specialist for evaluation.  Eustachian tube dysfunction, right ear Right ear clogged sensation for a week, likely due to uneven ear pressure. - Prescribed Flonase  nasal spray, one spray per nostril twice daily. - Sent prescription to [facility] pharmacy.    No follow-ups on file.   HPI Discussed the use of AI scribe software for clinical note transcription with the patient, who gave verbal consent to proceed.  History of Present Illness Daniel Frye is a 73 year old male who presents with difficulty swallowing and loss of appetite.  He experiences difficulty swallowing solid foods for the past couple of months, often needing to spit out food that 'won't go down'. He uses water or other liquids to aid swallowing. Softer foods like oatmeal and soup are consumed without issue.  He has a significant loss of appetite, lacking the urge to eat throughout the day and night. He is concerned about maintaining his weight between 170 and 180 pounds.  He has a recent onset of right ear congestion, starting  about a week ago, with a sensation of clogged ears and unequal ear pressure, temporarily relieved by 'popping' his ears.  He has no fever, cough, shortness of breath, nausea, vomiting, headaches, or recent cold-like symptoms. Bowel movements are regular without changes. He continues to smoke and engages in artwork as a hobby.     Review of Systems  All other systems reviewed and are negative.     Objective:     BP 118/70   Pulse 71   Ht 5' 9 (1.753 m)   Wt 172 lb (78 kg)   SpO2 96%   BMI 25.40 kg/m     Physical Exam Vitals and nursing note reviewed.  Constitutional:      Appearance: Normal appearance.  HENT:     Head: Normocephalic.     Right Ear: External ear normal.     Left Ear: External ear normal.  Eyes:     Conjunctiva/sclera: Conjunctivae normal.  Cardiovascular:     Rate and Rhythm: Normal rate.  Pulmonary:     Effort: Pulmonary effort is normal. No respiratory distress.  Abdominal:     Palpations: Abdomen is soft.  Musculoskeletal:        General: Normal range of motion.  Skin:    General: Skin is warm.  Neurological:     Mental Status: He is alert and oriented to person, place, and time.  Psychiatric:        Mood and Affect: Mood normal.      No results found for any visits on 11/07/23.     The ASCVD Risk score (Arnett DK, et al., 2019) failed to calculate for the following reasons:   Cannot find a previous HDL lab   Cannot find a previous total cholesterol lab      Ladoris MARLA Ny, MD

## 2023-11-21 NOTE — Progress Notes (Signed)
 Daniel JONELLE Brooklyn, MD [facility], [address]IP [address]  [place], [place] [address]  Main: [phone] Fax:  [phone] Pager: [phone]   Gastroenterology Consultation  Referring Provider:     Justus Leita Mettle,* Primary Care Physician:  Joshua Cathryne Rodney, MD Primary Gastroenterologist:  Dr. Corinn JONELLE Frye Reason for Consultation:   Difficulty swallowing        HPI:   Daniel Frye is a 73 y.o. male referred by Dr. Joshua Cathryne Rodney, MD  for consultation & management of difficulty swallowing, weight loss  History of Present Illness Daniel Frye is a 73 year old male with COPD and rheumatoid arthritis who presents with difficulty swallowing and weight loss.  He has been experiencing difficulty swallowing and an inability to keep food down for about two months. He describes a sensation of food not staying in his stomach and has experienced significant weight loss, stating he is 'just skin and bones.' No associated pain is reported, but he feels weak.  He has experienced heartburn daily for the past 20 days, which is a new symptom for him. He denies any history of acid reflux medication use. He also reports feeling bloated, although this has decreased recently. No constipation is noted.  He has a history of COPD and rheumatoid arthritis. He takes ibuprofen 800 mg once daily, usually with food, for his rheumatoid arthritis. He previously took it three times a day but reduced the dosage due to adverse effects. He also takes a blood pressure medication.  He consumes whiskey daily and smokes cigarettes. He denies any family history of throat or stomach cancer.  NSAIDs: Ibuprofen 800 mg daily  Antiplts/Anticoagulants/Anti thrombotics: None  GI Procedures:  Colonoscopy 12/31/2020 for colon cancer screening  - One 6 mm polyp in the transverse colon, removed with                         a cold snare. Resected and retrieved.                        -  One 5 mm polyp in the descending colon, removed with                         a cold snare. Resected and retrieved.                        - Diverticulosis in the entire examined colon.                        - Non-bleeding internal hemorrhoids. DIAGNOSIS:  A. COLON POLYP, TRANSVERSE; COLD SNARE:  - TUBULAR ADENOMA.  - NEGATIVE FOR HIGH-GRADE DYSPLASIA AND MALIGNANCY.   B.  COLON POLYP, DESCENDING; COLD SNARE:  - TUBULAR ADENOMA.  - NEGATIVE FOR HIGH-GRADE DYSPLASIA AND MALIGNANCY.    Past Medical History:  Diagnosis Date  . Arthritis   . CHF (congestive heart failure) (CMS/HHS-HCC)   . COPD (chronic obstructive pulmonary disease) (CMS/HHS-HCC)   . Diabetes mellitus type 2, uncomplicated (CMS/HHS-HCC)   . Hypertension     Past Surgical History:  Procedure Laterality Date  . SPINE SURGERY       Current Outpatient Medications:  .  albuterol  (PROVENTIL ) 2.5 mg /3 mL (0.083 %) nebulizer solution, Take 2.5 mg by nebulization every 6 (six) hours as needed for Wheezing., Disp: , Rfl:  .  albuterol  90 mcg/actuation inhaler, Inhale 2 inhalations into the lungs every 4 (four) hours as needed, Disp: , Rfl:  .  chlorthalidone  25 MG tablet, Take 25 mg by mouth once daily, Disp: , Rfl:  .  food supplemt, lactose-reduced (ENSURE PLUS) 0.05 gram- 1.5 kcal/mL Liqd, Take 237 mLs by mouth, Disp: , Rfl:  .  HUMIRA,CF, PEN 40 mg/0.4 mL pen injector kit, Inject 40 mg subcutaneously every 14 (fourteen) days, Disp: 1 kit, Rfl: 5 .  hydroCHLOROthiazide  (HYDRODIURIL ) 12.5 MG tablet, Take 12.5 mg by mouth once daily, Disp: , Rfl:  .  lisinopriL -hydroCHLOROthiazide  (ZESTORETIC ) 10-12.5 mg tablet, Take 1 tablet by mouth once daily, Disp: , Rfl:  .  TRELEGY ELLIPTA  100-62.5-25 mcg inhaler, Inhale 1 Puff into the lungs once daily, Disp: , Rfl:  .  omeprazole (PRILOSEC) 40 MG DR capsule, Take 1 capsule (40 mg total) by mouth 2 (two) times daily before meals for 90 days, Disp: 60 capsule, Rfl: 2   Family  History  Problem Relation Name Age of Onset  . Arthritis Mother    . Arthritis Father    . Rheum arthritis Brother       Social History   Tobacco Use  . Smoking status: Every Day    Current packs/day: 0.00    Types: Cigarettes    Last attempt to quit: 09/2014    Years since quitting: 9.2  . Smokeless tobacco: Never  Vaping Use  . Vaping status: Never Used  Substance Use Topics  . Alcohol use: Yes    Alcohol/week: 20.0 standard drinks of alcohol    Types: 20 Shots of liquor per week  . Drug use: Not Currently    Allergies as of 11/21/2023 - Reviewed 11/21/2023  Allergen Reaction Noted  . Infliximab Anaphylaxis 07/30/2019    Review of Systems:    All systems reviewed and negative except where noted in HPI.   Physical Exam:  BP 119/84 (BP Location: Right upper arm, Patient Position: Sitting, BP Cuff Size: Adult)   Pulse 92   Ht 177.8 cm (5' 10)   Wt 79.3 kg (174 lb 12.8 oz)   BMI 25.08 kg/m  No LMP for male patient.  General:   Alert,  Well-developed, well-nourished, pleasant and cooperative in NAD Eyes:  Sclera clear, no icterus.   Conjunctiva pink. Lungs:  Respirations even and unlabored.  Clear throughout to auscultation.   No wheezes, crackles, or rhonchi. No acute distress. Heart:  Regular rate and rhythm; no murmurs, clicks, rubs, or gallops. Abdomen:  Normal bowel sounds. Soft, non-tender and non-distended without masses, hepatosplenomegaly or hernias noted.  No guarding or rebound tenderness.   Rectal: Not performed Extremities:  No clubbing or edema.  No cyanosis. Neurologic:  Alert and oriented x3;  grossly normal neurologically. Skin:  Intact without significant lesions or rashes. No jaundice. Psych:  Alert and cooperative. Normal mood and affect.  Imaging Studies: Reviewed  Assessment and Plan:   Daniel Frye is a 73 y.o. male with history of COPD, chronic alcohol and tobacco use is seen in consultation for chronic dysphagia to solids  Assessment  & Plan Dysphagia with unintentional weight loss Dysphagia for two months with weight loss. Endoscopy planned. - Schedule upper endoscopy to evaluate esophagus and stomach. - Start acid control medication twice daily.  Heartburn Recent onset heartburn. Managed with medication and lifestyle changes. - Start omeprazole 40 mg twice daily before meals. - Advise to take ibuprofen with food and consider reducing ibuprofen use.  Heavy alcohol  use Daily whiskey consumption likely exacerbating symptoms and health decline. Advised reduction. - Advise gradual reduction of alcohol intake by 50% with goal of cessation.  Follow up based on the above workup   Daniel JONELLE Brooklyn, MD

## 2023-11-21 NOTE — Patient Instructions (Addendum)
 Upper Endoscopy  Your procedure is scheduled for: THURSDAY 11/30/2023   Your procedure is scheduled with  [x]   Dr. UNK     Your procedure will be performed at: [x]   [facility]    To get your scheduled arrival time:  [x]  [facility]- Please call the endo unit at [phone] between 1-3pm on ___ [facility] 09/24/2025________. The hospital will contact you to pre-register over the phone. If you need to cancel or reschedule, please contact [facility] at [phone].    Instructions for prior to procedure: STOP  []  Plavix   []  Coumadin  []  Eliquis [] Xarelto []  Other _______________________________________________________________   If you are on a blood thinner we will contact your prescribing physician for permission to hold this medication. Our office will contact you once this has been received and will notify you of how many days to hold this medication. If you have not heard from our office within 2 weeks of your scheduled procedure, please contact our office.   7 Days Prior to your procedure:       [x] Stop your iron supplements - iron leaves a red residue in the colon and can appear as blood in your colon.  [x] NO CARAFATE (SUCRALFATE) AFTER 7 PM THE NIGHT BEFORE THE TEST.  Hold your weekly dose of the following medications:    [] Ozempic  [] Trulicity  [] Mounjaro  [] Tzhncb  [] Other: _____________________    No smoking, vaping, dipping tobacco, and chewing gum after midnight.  [x]  Nothing to eat after midnight. You may proceed with clear liquids up to 3 hours prior to your arrival time.   *Clear liquids consist of anything you can hold up to the light and see through.  Examples:   coffee w/out cream, apple juice, jell-o w/out fruit, water, etc. NOTHING RED OR PURPLE IN COLOR.    [x]  You may take the following medication by 6 a.m. with just enough water to swallow your pills: Heart/Blood Pressure medication Seizure medication Thyroid  medication Parkinson's  medication Breathing medication * Do not take Aspirin, Multivitamins, Fish Oil, Ibuprofen or NSAIDS the day of your procedure.    If you are diabetic, please DO NOT take your diabetic pills the morning of your test.  Insulin  Instructions:            []     Take 1/3 dose the morning of your procedure          [x]     Take 1/2 dose the morning of your procedure Monitor blood sugar closely.  Call nurse with any questions.    [x]   [facility]-Go to the [facility] medical mall 1st Floor Registration desk at your designated time.*If you are a male of child-bearing age, do NOT empty your bladder prior to arrival. Notify the nurse/doctor if you have had any changes in your medical condition since your last office visit.  You will receive medication to make you sleepy for the test.  You MUST have someone drive you home and be responsible for your care. You can NOT drive, work, or operate heavy machinery for 24 hours after the test.  After your procedure, the nurse will go over discharge instructions.  If you have any questions call our office at [phone].  Additional Information: We recommend that you verify your insurance benefits and expected out of pocket costs prior to your procedure. We also recommend that you verify providers including anesthesiologist and the chosen facility are in net-work. While our office will perform a prior authorization we will not know  the cost of your procedure.   You MUST have someone drive you Home from your procedure.  You must have a responsible adult with a valid Driver's License who is on site throughout your entire procedure and who can stay with you for several hours after your procedure. This applies for [facility] & [facility]. Without verification that you have someone to do this, they will not proceed with your procedure. You may not go home alone in a taxi, shuttle [place], [place] or bus, as the drivers will not be responsible for you.   Please notify our office if your medical  status changes BEFORE your procedure date. This includes new diagnoses, surgeries, medications, and hospital visits/admissions. Failure to do this can result in your procedure being cancelled.   To ensure a successful exam, please follow all instructions carefully. Failure to accurately and completely prepare for your exam may result in the need for an additional procedures will be billed to your insurance.

## 2023-11-30 ENCOUNTER — Ambulatory Visit
Admission: RE | Admit: 2023-11-30 | Discharge: 2023-11-30 | Disposition: A | Discharge status: Home / Self Care | Attending: Gastroenterology | Admitting: Gastroenterology

## 2023-11-30 ENCOUNTER — Encounter
Admission: RE | Disposition: A | Discharge status: Home / Self Care | Payer: Self-pay | Source: Home / Self Care | Attending: Gastroenterology

## 2023-11-30 ENCOUNTER — Ambulatory Visit: Admitting: Anesthesiology

## 2023-11-30 ENCOUNTER — Encounter: Payer: Self-pay | Admitting: Gastroenterology

## 2023-11-30 DIAGNOSIS — E119 Type 2 diabetes mellitus without complications: Secondary | ICD-10-CM | POA: Diagnosis not present

## 2023-11-30 DIAGNOSIS — G4730 Sleep apnea, unspecified: Secondary | ICD-10-CM | POA: Insufficient documentation

## 2023-11-30 DIAGNOSIS — J449 Chronic obstructive pulmonary disease, unspecified: Secondary | ICD-10-CM | POA: Insufficient documentation

## 2023-11-30 DIAGNOSIS — I110 Hypertensive heart disease with heart failure: Secondary | ICD-10-CM | POA: Diagnosis not present

## 2023-11-30 DIAGNOSIS — R1314 Dysphagia, pharyngoesophageal phase: Secondary | ICD-10-CM | POA: Insufficient documentation

## 2023-11-30 DIAGNOSIS — F17210 Nicotine dependence, cigarettes, uncomplicated: Secondary | ICD-10-CM | POA: Diagnosis not present

## 2023-11-30 DIAGNOSIS — F17290 Nicotine dependence, other tobacco product, uncomplicated: Secondary | ICD-10-CM | POA: Diagnosis not present

## 2023-11-30 DIAGNOSIS — I509 Heart failure, unspecified: Secondary | ICD-10-CM | POA: Diagnosis not present

## 2023-11-30 HISTORY — PX: ESOPHAGOGASTRODUODENOSCOPY: SHX5428

## 2023-11-30 LAB — GLUCOSE, CAPILLARY: Glucose-Capillary: 117 mg/dL — ABNORMAL HIGH (ref 70–99)

## 2023-11-30 SURGERY — EGD (ESOPHAGOGASTRODUODENOSCOPY)
Anesthesia: General

## 2023-11-30 MED ORDER — LIDOCAINE HCL (CARDIAC) PF 100 MG/5ML IV SOSY
PREFILLED_SYRINGE | INTRAVENOUS | Status: DC | PRN
  Administered 2023-11-30: 100 mg via INTRAVENOUS

## 2023-11-30 MED ORDER — OMEPRAZOLE 40 MG PO CPDR: 40.0000 mg | DELAYED_RELEASE_CAPSULE | Freq: Every day | ORAL | 1 refills | Status: DC

## 2023-11-30 MED ORDER — PROPOFOL 500 MG/50ML IV EMUL
INTRAVENOUS | Status: DC | PRN
  Administered 2023-11-30: 60 mg via INTRAVENOUS
  Administered 2023-11-30: 150 ug/kg/min via INTRAVENOUS

## 2023-11-30 MED ORDER — DEXMEDETOMIDINE HCL IN NACL 200 MCG/50ML IV SOLN
INTRAVENOUS | Status: DC | PRN
  Administered 2023-11-30 (×2): 4 ug via INTRAVENOUS

## 2023-11-30 MED ORDER — FENTANYL CITRATE (PF) 100 MCG/2ML IJ SOLN
INTRAMUSCULAR | Status: AC
  Filled 2023-11-30: qty 2

## 2023-11-30 MED ORDER — FENTANYL CITRATE (PF) 100 MCG/2ML IJ SOLN
INTRAMUSCULAR | Status: DC | PRN
  Administered 2023-11-30: 50 ug via INTRAVENOUS

## 2023-11-30 MED ORDER — SODIUM CHLORIDE 0.9 % IV SOLN
INTRAVENOUS | Status: DC
Start: 2023-11-30 — End: 2023-11-30

## 2023-11-30 NOTE — Anesthesia Postprocedure Evaluation (Signed)
 Anesthesia Post Note  Patient: Daniel Frye  Procedure(s) Performed: EGD (ESOPHAGOGASTRODUODENOSCOPY)  Patient location during evaluation: Endoscopy Anesthesia Type: General Level of consciousness: awake and alert Pain management: pain level controlled Vital Signs Assessment: post-procedure vital signs reviewed and stable Respiratory status: spontaneous breathing, nonlabored ventilation and respiratory function stable Cardiovascular status: blood pressure returned to baseline and stable Postop Assessment: no apparent nausea or vomiting Anesthetic complications: no   No notable events documented.   Last Vitals:  Vitals:   11/30/23 1130 11/30/23 1133  BP:  129/83  Pulse: 69 69  Resp: (!) 22 12  Temp:    SpO2: 100% 99%    Last Pain:  Vitals:   11/30/23 0951  TempSrc: Temporal  PainSc: 0-No pain                 Fairy POUR Sara Keys

## 2023-11-30 NOTE — Op Note (Signed)
 [facility] Patient Name: Daniel Frye Procedure Date: 11/30/2023 10:43 AM MRN: [id number] Account #: [id number] Date of Birth: 27-Aug-1950 Admit Type: Outpatient Age: 73 Room: [facility] ENDO ROOM 4 Gender: Male Note Status: Finalized Instrument Name: Upper GI Scope [id number] Procedure:             Upper GI endoscopy Indications:           Esophageal dysphagia Providers:             Corinn Jess Brooklyn MD, MD Referring MD:          Mackey LOIS Ny (Referring MD) Medicines:             General Anesthesia Complications:         No immediate complications. Estimated blood loss: None. Procedure:             Pre-Anesthesia Assessment:                        - Prior to the procedure, a History and Physical was                         performed, and patient medications and allergies were                         reviewed. The patient is competent. The risks and                         benefits of the procedure and the sedation options and                         risks were discussed with the patient. All questions                         were answered and informed consent was obtained.                         Patient identification and proposed procedure were                         verified by the physician, the nurse, the                         anesthesiologist, the anesthetist and the technician                         in the pre-procedure area in the procedure room in the                         endoscopy suite. Mental Status Examination: alert and                         oriented. Airway Examination: normal oropharyngeal                         airway and neck mobility. Respiratory Examination:                         clear to auscultation. CV Examination: normal.  Prophylactic Antibiotics: The patient does not require                         prophylactic antibiotics. Prior Anticoagulants: The                         patient has taken  no anticoagulant or antiplatelet                         agents. ASA Grade Assessment: III - A patient with                         severe systemic disease. After reviewing the risks and                         benefits, the patient was deemed in satisfactory                         condition to undergo the procedure. The anesthesia                         plan was to use general anesthesia. Immediately prior                         to administration of medications, the patient was                         re-assessed for adequacy to receive sedatives. The                         heart rate, respiratory rate, oxygen saturations,                         blood pressure, adequacy of pulmonary ventilation, and                         response to care were monitored throughout the                         procedure. The physical status of the patient was                         re-assessed after the procedure.                        After obtaining informed consent, the endoscope was                         passed under direct vision. Throughout the procedure,                         the patient's blood pressure, pulse, and oxygen                         saturations were monitored continuously. The Endoscope                         was introduced through the mouth, and advanced to the  second part of duodenum. The upper GI endoscopy was                         accomplished without difficulty. The patient tolerated                         the procedure well. Findings:      The duodenal bulb and second portion of the duodenum were normal.      The entire examined stomach was normal. Biopsies were taken with a cold       forceps for histology.      The cardia and gastric fundus were normal on retroflexion.      The gastroesophageal junction and examined esophagus were normal. Impression:            - Normal duodenal bulb and second portion of the                          duodenum.                        - Normal stomach. Biopsied.                        - Normal gastroesophageal junction and esophagus. Recommendation:        - Await pathology results.                        - Discharge patient to home (with escort).                        - Resume previous diet today.                        - Continue present medications.                        - Use a proton pump inhibitor PO daily indefinitely. Procedure Code(s):     --- Professional ---                        [phone], Esophagogastroduodenoscopy, flexible,                         transoral; with biopsy, single or multiple Diagnosis Code(s):     --- Professional ---                        R13.14, Dysphagia, pharyngoesophageal phase CPT copyright 2022 American Medical Association. All rights reserved. The codes documented in this report are preliminary and upon coder review may  be revised to meet current compliance requirements. Dr. Corinn Brooklyn Corinn Jess Brooklyn MD, MD 11/30/2023 11:16:58 AM This report has been signed electronically. Number of Addenda: 0 Note Initiated On: 11/30/2023 10:43 AM Estimated Blood Loss:  Estimated blood loss: none.      [facility]

## 2023-11-30 NOTE — Transfer of Care (Signed)
 Immediate Anesthesia Transfer of Care Note  Patient: Daniel Frye  Procedure(s) Performed: EGD (ESOPHAGOGASTRODUODENOSCOPY)  Patient Location: PACU  Anesthesia Type:MAC  Level of Consciousness: sedated  Airway & Oxygen Therapy: Patient Spontanous Breathing  Post-op Assessment: Report given to RN and Post -op Vital signs reviewed and stable  Post vital signs: stable  Last Vitals:  Vitals Value Taken Time  BP 94/73 11/30/23 11:12  Temp    Pulse 75 11/30/23 11:13  Resp 21 11/30/23 11:13  SpO2 98 % 11/30/23 11:13  Vitals shown include unfiled device data.  Last Pain:  Vitals:   11/30/23 0951  TempSrc: Temporal  PainSc: 0-No pain         Complications: No notable events documented.

## 2023-11-30 NOTE — Anesthesia Preprocedure Evaluation (Signed)
 Anesthesia Evaluation  Patient identified by MRN, date of birth, ID band Patient awake    Reviewed: Allergy & Precautions, NPO status , Patient's Chart, lab work & pertinent test results  History of Anesthesia Complications Negative for: history of anesthetic complications  Airway Mallampati: III  TM Distance: <3 FB Neck ROM: full    Dental  (+) Upper Dentures, Lower Dentures   Pulmonary sleep apnea , COPD, Current Smoker   Pulmonary exam normal        Cardiovascular Exercise Tolerance: Good hypertension, (-) angina +CHF  Normal cardiovascular exam     Neuro/Psych negative neurological ROS     GI/Hepatic negative GI ROS, Neg liver ROS,neg GERD  ,,  Endo/Other  diabetes, Type 2    Renal/GU negative Renal ROS  negative genitourinary   Musculoskeletal   Abdominal   Peds  Hematology negative hematology ROS (+)   Anesthesia Other Findings Patient reports that they do not think that any food or pills are stuck in their throat at this time.  Past Medical History: No date: Alcohol abuse No date: CHF (congestive heart failure) (HCC) No date: Cocaine use No date: COPD (chronic obstructive pulmonary disease) (HCC) No date: Depression No date: Diabetes mellitus without complication (HCC) No date: Hypertension No date: Marijuana use 1989: Rheumatoid arteritis (HCC)     Comment:  Took shots for awhile but not now No date: Rheumatoid arthritis (HCC) No date: Substance abuse (HCC) No date: Tobacco use  Past Surgical History: No date: BACK SURGERY 12/31/2020: COLONOSCOPY WITH PROPOFOL ; N/A     Comment:  Procedure: COLONOSCOPY WITH PROPOFOL ;  Surgeon: Jinny Carmine, MD;  Location: [facility] ENDOSCOPY;  Service:               Endoscopy;  Laterality: N/A; 03/07/1985: heart surgery to drain fluid; N/A     Comment:  Heart surgery to remove fluid   BMI    Body Mass Index: 24.02 kg/m       Reproductive/Obstetrics negative OB ROS                              Anesthesia Physical Anesthesia Plan  ASA: 3  Anesthesia Plan: General   Post-op Pain Management:    Induction: Intravenous  PONV Risk Score and Plan: Propofol  infusion and TIVA  Airway Management Planned: Natural Airway and Nasal Cannula  Additional Equipment:   Intra-op Plan:   Post-operative Plan:   Informed Consent: I have reviewed the patients History and Physical, chart, labs and discussed the procedure including the risks, benefits and alternatives for the proposed anesthesia with the patient or authorized representative who has indicated his/her understanding and acceptance.     Dental Advisory Given  Plan Discussed with: Anesthesiologist, CRNA and Surgeon  Anesthesia Plan Comments: (Patient consented for risks of anesthesia including but not limited to:  - adverse reactions to medications - risk of airway placement if required - damage to eyes, teeth, lips or other oral mucosa - nerve damage due to positioning  - sore throat or hoarseness - Damage to heart, brain, nerves, lungs, other parts of body or loss of life  Patient voiced understanding and assent.)        Anesthesia Quick Evaluation

## 2023-11-30 NOTE — H&P (Signed)
 Daniel JONELLE Brooklyn, MD [facility], DHIP [address]  [place], [place] [address]  Main: [phone] Fax:  [phone] Pager: [phone]   Primary Care Physician:  Kotturi, Vinay K, MD Primary Gastroenterologist:  Daniel Frye  Pre-Procedure History & Physical: HPI:  Daniel Frye is a 73 y.o. male is here for an endoscopy.   Past Medical History:  Diagnosis Date   Alcohol abuse    CHF (congestive heart failure) (HCC)    Cocaine use    COPD (chronic obstructive pulmonary disease) (HCC)    Depression    Diabetes mellitus without complication (HCC)    Hypertension    Marijuana use    Rheumatoid arteritis (HCC) 1989   Took shots for awhile but not now   Rheumatoid arthritis (HCC)    Substance abuse (HCC)    Tobacco use     Past Surgical History:  Procedure Laterality Date   BACK SURGERY     COLONOSCOPY WITH PROPOFOL  N/A 12/31/2020   Procedure: COLONOSCOPY WITH PROPOFOL ;  Surgeon: Daniel Carmine, MD;  Location: [facility] ENDOSCOPY;  Service: Endoscopy;  Laterality: N/A;   heart surgery to drain fluid N/A 03/07/1985   Heart surgery to remove fluid     Prior to Admission medications   Medication Sig Start Date End Date Taking? Authorizing Provider  albuterol  (PROAIR  HFA) 108 (90 Base) MCG/ACT inhaler Inhale 2 puffs into the lungs every 6 (six) hours as needed. INHALE 2 PUFFS BY MOUTH EVERY 4 HOURS AS NEEDED FOR  WHEEZING  OR  SHORTNESS  OF  BREATH 04/17/23   Daniel Cathryne BROCKS, MD  chlorthalidone  (HYGROTON ) 25 MG tablet Take 1 tablet (25 mg total) by mouth daily. 11/01/23   Kotturi, Vinay K, MD  Ensure Plus (ENSURE PLUS) LIQD Take 237 mLs by mouth.    [provider]  fluticasone  (FLONASE ) 50 MCG/ACT nasal spray Place 2 sprays into both nostrils daily. 11/07/23   Kotturi, Vinay K, MD  Fluticasone -Umeclidin-Vilant (TRELEGY ELLIPTA ) 100-62.5-25 MCG/ACT AEPB Inhale 1 puff into the lungs daily.    [provider]   Fluticasone -Umeclidin-Vilant (TRELEGY ELLIPTA ) 100-62.5-25 MCG/ACT AEPB Inhale 1 puff into the lungs daily. 07/03/23   Daniel Cathryne BROCKS, MD  furosemide  (LASIX ) 20 MG tablet Take 1 tablet (20 mg total) by mouth daily. Patient not taking: Reported on 11/07/2023 05/30/23   Daniel Cathryne BROCKS, MD  mirtazapine  (REMERON  SOL-TAB) 30 MG disintegrating tablet Take 1 tablet (30 mg total) by mouth at bedtime. 11/01/23   Kotturi, Vinay K, MD    Allergies as of 11/21/2023 - Review Complete 11/07/2023  Allergen Reaction Noted   Renflexis [infliximab] Anaphylaxis 07/30/2019    Family History  Problem Relation Age of Onset   COPD Brother    Arthritis Brother    Diabetes Brother    Hypertension Brother     Social History   Socioeconomic History   Marital status: Single    Spouse name: Not on file   Number of children: 2   Years of education: 8   Highest education level: 9th grade  Occupational History   Occupation: Retired  Tobacco Use   Smoking status: Every Day    Current packs/day: 0.25    Average packs/day: 0.3 packs/day for 51.0 years (12.8 ttl pk-yrs)    Types: Cigarettes, Cigars   Smokeless tobacco: Never   Tobacco comments:    Wants to quit no quit date set  Vaping Use   Vaping status: Never Used  Substance and Sexual Activity  Alcohol use: Yes    Alcohol/week: 49.0 standard drinks of alcohol    Types: 49 Standard drinks or equivalent per week    Comment: Drinks 1/5 day of wine    Drug use: Not Currently    Comment: no drugs in 1 year and 3 months   Sexual activity: Not Currently  Other Topics Concern   Not on file  Social History Narrative   One of his children passed away 8 months ago from December 16, 2020   Social Drivers of Health   Financial Resource Strain: Low Risk  (12/20/2022)   Overall Financial Resource Strain (CARDIA)    Difficulty of Paying Living Expenses: Not hard at all  Food Insecurity: No Food Insecurity (03/16/2023)   Hunger Vital Sign    Worried About Running  Out of Food in the Last Year: Never true    Ran Out of Food in the Last Year: Never true  Transportation Needs: No Transportation Needs (03/16/2023)   PRAPARE - Administrator, Civil Service (Medical): No    Lack of Transportation (Non-Medical): No  Physical Activity: Inactive (12/20/2022)   Exercise Vital Sign    Days of Exercise per Week: 0 days    Minutes of Exercise per Session: 0 min  Stress: No Stress Concern Present (12/20/2022)   [employer] of Occupational Health - Occupational Stress Questionnaire    Feeling of Stress : Not at all  Social Connections: Moderately Integrated (03/16/2023)   Social Connection and Isolation Panel    Frequency of Communication with Friends and Family: More than three times a week    Frequency of Social Gatherings with Friends and Family: More than three times a week    Attends Religious Services: More than 4 times per year    Active Member of [employer] or Organizations: Yes    Attends Banker Meetings: 1 to 4 times per year    Marital Status: Never married  Recent Concern: Social Connections - Moderately Isolated (12/20/2022)   Social Connection and Isolation Panel    Frequency of Communication with Friends and Family: Twice a week    Frequency of Social Gatherings with Friends and Family: More than three times a week    Attends Religious Services: 1 to 4 times per year    Active Member of [employer] or Organizations: No    Attends Banker Meetings: Never    Marital Status: Never married  Intimate Partner Violence: Not At Risk (03/16/2023)   Humiliation, Afraid, Rape, and Kick questionnaire    Fear of Current or Ex-Partner: No    Emotionally Abused: No    Physically Abused: No    Sexually Abused: No    Review of Systems: See HPI, otherwise negative ROS  Physical Exam: BP 136/68   Pulse 83   Temp (!) 96.2 F (35.7 C) (Temporal)   Resp 20   Ht 5' 10 (1.778 m)   Wt 75.9 kg   SpO2 100%   BMI 24.02 kg/m   General:   Alert,  pleasant and cooperative in NAD Head:  Normocephalic and atraumatic. Neck:  Supple; no masses or thyromegaly. Lungs:  Clear throughout to auscultation.    Heart:  Regular rate and rhythm. Abdomen:  Soft, nontender and nondistended. Normal bowel sounds, without guarding, and without rebound.   Neurologic:  Alert and  oriented x4;  grossly normal neurologically.  Impression/Plan: Daniel Frye is here for an endoscopy to be performed for dysphagia  Risks, benefits, limitations, and alternatives  regarding  endoscopy have been reviewed with the patient.  Questions have been answered.  All parties agreeable.   Daniel Brooklyn, MD  11/30/2023, 10:18 AM

## 2023-12-04 LAB — SURGICAL PATHOLOGY

## 2023-12-06 ENCOUNTER — Ambulatory Visit: Payer: Self-pay | Admitting: Gastroenterology

## 2023-12-11 ENCOUNTER — Ambulatory Visit: Admitting: Family Medicine

## 2023-12-12 ENCOUNTER — Ambulatory Visit (INDEPENDENT_AMBULATORY_CARE_PROVIDER_SITE_OTHER): Admitting: Family Medicine

## 2023-12-12 ENCOUNTER — Encounter: Payer: Self-pay | Admitting: Family Medicine

## 2023-12-12 VITALS — BP 112/70 | HR 110 | Ht 70.0 in | Wt 168.0 lb

## 2023-12-12 DIAGNOSIS — L309 Dermatitis, unspecified: Secondary | ICD-10-CM

## 2023-12-12 DIAGNOSIS — M069 Rheumatoid arthritis, unspecified: Secondary | ICD-10-CM | POA: Diagnosis not present

## 2023-12-12 MED ORDER — PREDNISONE 10 MG PO TABS: ORAL_TABLET | ORAL | 0 refills | Status: AC

## 2023-12-12 NOTE — Progress Notes (Signed)
 Acute Office Visit  Subjective:     Patient ID: Daniel Frye, male    DOB: 02-Aug-1950, 73 y.o.   MRN: [id number]  Chief Complaint  Patient presents with   Rash    Patient states he has developed a rash 1 week ago, dry, crackly skin, all over his body     HPI Patient is in today for  Discussed the use of AI scribe software for clinical note transcription with the patient, who gave verbal consent to proceed.  History of Present Illness Daniel Frye is a 73 year old male who presents with a rapidly spreading rash and severe itching.  Approximately two years ago, he developed a severe rash that was managed by a dermatologist and rheumatologist with self-administered injections, which were effective at the time. However, the rash has recurred and spread rapidly over the past three weeks.  The rash is widespread, with the feet being the most affected area. The skin on his hands has turned black and is peeling. He experiences severe itching, particularly on his back, which disrupts his sleep. He uses a stick to scratch the itch, sometimes to the point of bleeding.  There have been no recent changes in medication, exposure to new products, or outdoor activities that could have triggered the rash. He reports significant weight loss of about twenty pounds, which he attributes to not eating.  The patient reports that he is not on any new medications and has not been in contact with any known allergens or irritants.    Review of Systems  All other systems reviewed and are negative.       Objective:    BP 112/70   Pulse (!) 110   Ht 5' 10 (1.778 m)   Wt 168 lb (76.2 kg)   SpO2 97%   BMI 24.11 kg/m    Physical Exam Vitals and nursing note reviewed.  Constitutional:      Appearance: Normal appearance.  HENT:     Head: Normocephalic.     Right Ear: External ear normal.     Left Ear: External ear normal.  Eyes:     Conjunctiva/sclera: Conjunctivae normal.  Cardiovascular:      Rate and Rhythm: Normal rate.  Pulmonary:     Effort: Pulmonary effort is normal. No respiratory distress.  Abdominal:     Palpations: Abdomen is soft.  Musculoskeletal:        General: Normal range of motion.  Skin:    General: Skin is warm.  Neurological:     Mental Status: He is alert and oriented to person, place, and time.  Psychiatric:        Mood and Affect: Mood normal.     No results found for any visits on 12/12/23.      Assessment & Plan:   Problem List Items Addressed This Visit   None Visit Diagnoses       Dermatitis    -  Primary   Relevant Medications   predniSONE  (DELTASONE ) 10 MG tablet   Other Relevant Orders   Ambulatory referral to Dermatology     Rheumatoid arthritis, involving unspecified site, unspecified whether rheumatoid factor present (HCC)       Relevant Medications   predniSONE  (DELTASONE ) 10 MG tablet   Other Relevant Orders   Ambulatory referral to Rheumatology       Meds ordered this encounter  Medications   predniSONE  (DELTASONE ) 10 MG tablet    Sig: Take 6 tablets (60 mg total) by  mouth daily with breakfast for 1 day, THEN 5 tablets (50 mg total) daily with breakfast for 1 day, THEN 4 tablets (40 mg total) daily with breakfast for 1 day, THEN 3 tablets (30 mg total) daily with breakfast for 1 day, THEN 2 tablets (20 mg total) daily with breakfast for 1 day, THEN 1 tablet (10 mg total) daily with breakfast for 1 day.    Dispense:  21 tablet    Refill:  0  Assessment and Plan Assessment & Plan Generalized pruritic rash with skin peeling Severe pruritic rash with skin peeling for three weeks, affecting entire body, notably feet. Itching disrupts sleep. Previous similar episode managed with dermatologist-prescribed injections. - Prescribed steroid taper - Advised use of moisturizer to prevent skin dryness. - Referred to dermatology for further evaluation and management. - Sent prescription to pharmacy for immediate  use.  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 Chronic issue.    No follow-ups on file.  Vinary K Corbitt Cloke, MD

## 2023-12-27 ENCOUNTER — Other Ambulatory Visit: Payer: Self-pay | Admitting: Family Medicine

## 2023-12-27 ENCOUNTER — Ambulatory Visit (INDEPENDENT_AMBULATORY_CARE_PROVIDER_SITE_OTHER)

## 2023-12-27 ENCOUNTER — Telehealth: Payer: Self-pay

## 2023-12-27 VITALS — BP 138/80 | Ht 70.0 in | Wt 168.2 lb

## 2023-12-27 DIAGNOSIS — Z0000 Encounter for general adult medical examination without abnormal findings: Secondary | ICD-10-CM | POA: Diagnosis not present

## 2023-12-27 DIAGNOSIS — J441 Chronic obstructive pulmonary disease with (acute) exacerbation: Secondary | ICD-10-CM

## 2023-12-27 DIAGNOSIS — I10 Essential (primary) hypertension: Secondary | ICD-10-CM

## 2023-12-27 DIAGNOSIS — F32A Depression, unspecified: Secondary | ICD-10-CM

## 2023-12-27 MED ORDER — ENSURE PLUS PO LIQD: 237.0000 mL | Freq: Two times a day (BID) | ORAL | 12 refills | Status: AC

## 2023-12-27 MED ORDER — MIRTAZAPINE 30 MG PO TBDP: 30.0000 mg | ORAL_TABLET | Freq: Every day | ORAL | 1 refills | Status: AC

## 2023-12-27 MED ORDER — FUROSEMIDE 20 MG PO TABS: 20.0000 mg | ORAL_TABLET | Freq: Every day | ORAL | 3 refills | Status: DC

## 2023-12-27 MED ORDER — TRELEGY ELLIPTA 100-62.5-25 MCG/ACT IN AEPB: 1.0000 | INHALATION_SPRAY | Freq: Every day | RESPIRATORY_TRACT | 11 refills | Status: AC

## 2023-12-27 MED ORDER — ALBUTEROL SULFATE HFA 108 (90 BASE) MCG/ACT IN AERS: 2.0000 | INHALATION_SPRAY | Freq: Four times a day (QID) | RESPIRATORY_TRACT | 6 refills | Status: DC | PRN

## 2023-12-27 MED ORDER — OMEPRAZOLE 40 MG PO CPDR: 40.0000 mg | DELAYED_RELEASE_CAPSULE | Freq: Every day | ORAL | 1 refills | Status: AC

## 2023-12-27 MED ORDER — CHLORTHALIDONE 25 MG PO TABS: 25.0000 mg | ORAL_TABLET | Freq: Every day | ORAL | 0 refills | Status: DC

## 2023-12-27 NOTE — Telephone Encounter (Signed)
 Spoke with Carly, relayed the information from Dr. Sol. She verbalized understand and denied any questions.

## 2023-12-27 NOTE — Progress Notes (Signed)
 Subjective:   Daniel Frye is a 73 y.o. who presents for a Medicare Wellness preventive visit.  As a reminder, Annual Wellness Visits don't include a physical exam, and some assessments may be limited, especially if this visit is performed virtually. We may recommend an in-person follow-up visit with your provider if needed.  Visit Complete: In person  Persons Participating in Visit: Patient.  AWV Questionnaire: No: Patient Medicare AWV questionnaire was not completed prior to this visit.  Cardiac Risk Factors include: advanced age (>28men, >74 women);male gender;hypertension;sedentary lifestyle;smoking/ tobacco exposure     Objective:    Today's Vitals   12/27/23 1127  BP: 138/80  Weight: 168 lb 3.2 oz (76.3 kg)  Height: 5' 10 (1.778 m)   Body mass index is 24.13 kg/m.     12/27/2023   11:34 AM 11/30/2023    9:54 AM 12/20/2022    2:10 PM 01/01/2021   10:20 AM 12/31/2020   10:20 AM 11/22/2020   10:53 AM 10/09/2019   11:41 AM  Advanced Directives  Does Patient Have a Medical Advance Directive? No No No Yes No Yes No  Type of Advance Directive      Healthcare Power of Attorney   Does patient want to make changes to medical advance directive?    Yes (MAU/Ambulatory/Procedural Areas - Information given)  No - Patient declined   Copy of Healthcare Power of Attorney in Chart?      No - copy requested   Would patient like information on creating a medical advance directive? No - Patient declined  No - Patient declined        Current Medications (verified) Outpatient Encounter Medications as of 12/27/2023  Medication Sig   albuterol  (PROAIR  HFA) 108 (90 Base) MCG/ACT inhaler Inhale 2 puffs into the lungs every 6 (six) hours as needed. INHALE 2 PUFFS BY MOUTH EVERY 4 HOURS AS NEEDED FOR  WHEEZING  OR  SHORTNESS  OF  BREATH   chlorthalidone  (HYGROTON ) 25 MG tablet Take 1 tablet (25 mg total) by mouth daily.   Ensure Plus (ENSURE PLUS) LIQD Take 237 mLs by mouth 2 (two) times  daily between meals. Take 237 mLs by mouth.   fluticasone  (FLONASE ) 50 MCG/ACT nasal spray Place 2 sprays into both nostrils daily.   Fluticasone -Umeclidin-Vilant (TRELEGY ELLIPTA ) 100-62.5-25 MCG/ACT AEPB Inhale 1 puff into the lungs daily.   Fluticasone -Umeclidin-Vilant (TRELEGY ELLIPTA ) 100-62.5-25 MCG/ACT AEPB Inhale 1 puff into the lungs daily.   furosemide  (LASIX ) 20 MG tablet Take 1 tablet (20 mg total) by mouth daily.   mirtazapine  (REMERON  SOL-TAB) 30 MG disintegrating tablet Take 1 tablet (30 mg total) by mouth at bedtime.   omeprazole  (PRILOSEC) 40 MG capsule Take 1 capsule (40 mg total) by mouth daily before breakfast.   No facility-administered encounter medications on file as of 12/27/2023.    Allergies (verified) Renflexis [infliximab]   History: Past Medical History:  Diagnosis Date   Alcohol abuse    CHF (congestive heart failure) (HCC)    Cocaine use    COPD (chronic obstructive pulmonary disease) (HCC)    Depression    Diabetes mellitus without complication (HCC)    Hypertension    Marijuana use    Rheumatoid arteritis (HCC) 1989   Took shots for awhile but not now   Rheumatoid arthritis (HCC)    Substance abuse (HCC)    Tobacco use    Past Surgical History:  Procedure Laterality Date   BACK SURGERY     COLONOSCOPY WITH PROPOFOL   N/A 12/31/2020   Procedure: COLONOSCOPY WITH PROPOFOL ;  Surgeon: Jinny Carmine, MD;  Location[facility] ENDOSCOPY;  Service: Endoscopy;  Laterality: N/A;   ESOPHAGOGASTRODUODENOSCOPY N/A 11/30/2023   Procedure: EGD (ESOPHAGOGASTRODUODENOSCOPY);  Surgeon: Unk Corinn Skiff, MD;  Location: [facility] ENDOSCOPY;  Service: Gastroenterology;  Laterality: N/A;   heart surgery to drain fluid N/A 03/07/1985   Heart surgery to remove fluid    Family History  Problem Relation Age of Onset   COPD Brother    Arthritis Brother    Diabetes Brother    Hypertension Brother    Social History   Socioeconomic History   Marital status: Single     Spouse name: Not on file   Number of children: 2   Years of education: 8   Highest education level: 9th grade  Occupational History   Occupation: Retired  Tobacco Use   Smoking status: Every Day    Current packs/day: 0.25    Average packs/day: 0.3 packs/day for 51.0 years (12.8 ttl pk-yrs)    Types: Cigarettes, Cigars   Smokeless tobacco: Never   Tobacco comments:    Wants to quit no quit date set  Vaping Use   Vaping status: Never Used  Substance and Sexual Activity   Alcohol use: Yes    Alcohol/week: 49.0 standard drinks of alcohol    Types: 49 Standard drinks or equivalent per week    Comment: Drinks 1/5 day of wine    Drug use: Not Currently    Comment: no drugs in 1 year and 3 months   Sexual activity: Not Currently  Other Topics Concern   Not on file  Social History Narrative   One of his children passed away 8 months ago from 12/01/20   Social Drivers of Health   Financial Resource Strain: Low Risk  (12/27/2023)   Overall Financial Resource Strain (CARDIA)    Difficulty of Paying Living Expenses: Not hard at all  Food Insecurity: No Food Insecurity (12/27/2023)   Hunger Vital Sign    Worried About Running Out of Food in the Last Year: Never true    Ran Out of Food in the Last Year: Never true  Transportation Needs: No Transportation Needs (12/27/2023)   PRAPARE - Administrator, Civil Service (Medical): No    Lack of Transportation (Non-Medical): No  Physical Activity: Inactive (12/27/2023)   Exercise Vital Sign    Days of Exercise per Week: 0 days    Minutes of Exercise per Session: 0 min  Stress: No Stress Concern Present (12/27/2023)   [employer] of Occupational Health - Occupational Stress Questionnaire    Feeling of Stress: Only a little  Social Connections: Moderately Integrated (12/27/2023)   Social Connection and Isolation Panel    Frequency of Communication with Friends and Family: More than three times a week    Frequency of  Social Gatherings with Friends and Family: More than three times a week    Attends Religious Services: More than 4 times per year    Active Member of [employer] or Organizations: Yes    Attends Banker Meetings: 1 to 4 times per year    Marital Status: Never married    Tobacco Counseling Ready to quit: Not Answered Counseling given: Not Answered Tobacco comments: Wants to quit no quit date set    Clinical Intake:  Pre-visit preparation completed: Yes  Pain : No/denies pain     BMI - recorded: 24.13 Nutritional Status: BMI of 19-24  Normal Nutritional Risks:  None Diabetes: No  Lab Results  Component Value Date   HGBA1C 5.6 10/18/2023   HGBA1C 5.5 02/16/2023   HGBA1C 5.8 (H) 08/09/2021     How often do you need to have someone help you when you read instructions, pamphlets, or other written materials from your doctor or pharmacy?: 1 - Never  Interpreter Needed?: No  Information entered by :: JHONNIE DAS, LPN   Activities of Daily Living     12/27/2023   11:37 AM  In your present state of health, do you have any difficulty performing the following activities:  Hearing? 1  Vision? 0  Difficulty concentrating or making decisions? 0  Walking or climbing stairs? 1  Dressing or bathing? 0  Doing errands, shopping? 1  Preparing Food and eating ? N  Using the Toilet? N  In the past six months, have you accidently leaked urine? N  Do you have problems with loss of bowel control? N  Managing your Medications? Y  Managing your Finances? Y  Housekeeping or managing your Housekeeping? Y    Patient Care Team: Kotturi, Vinay K, MD as PCP - General (Family Medicine) Ammon Blunt, MD as Consulting Physician (Cardiology) French Mering, MD as Referring Physician (Rheumatology) Sharlot Agent, OD (Optometry)  I have updated your Care Teams any recent Medical Services you may have received from other providers in the past year.     Assessment:    This is a routine wellness examination for Daniel Frye.  Hearing/Vision screen Hearing Screening - Comments:: HAS THEM, BUT DOESN'T WEAR THEM Vision Screening - Comments:: WEARS GLASSES ALL DAY- [facility] DR.SHADE   Goals Addressed             This Visit's Progress    DIET - REDUCE SUGAR INTAKE         Depression Screen     12/27/2023   11:32 AM 10/18/2023   11:21 AM 07/25/2023    1:52 PM 05/30/2023   10:51 AM 04/17/2023    1:29 PM 03/02/2023    1:56 PM 02/16/2023    1:26 PM  PHQ 2/9 Scores  PHQ - 2 Score 0 3 3 2 4 4 1   PHQ- 9 Score 0 9 11 7 11 25 3     Fall Risk     12/27/2023   11:37 AM 10/18/2023   11:21 AM 07/25/2023    1:52 PM 05/30/2023   10:51 AM 04/17/2023    1:29 PM  Fall Risk   Falls in the past year? 0 1 1 1  0  Number falls in past yr: 0 0 1 1 0  Injury with Fall? 0 0 0 0 0  Risk for fall due to :  History of fall(s) No Fall Risks No Fall Risks No Fall Risks  Follow up Falls evaluation completed;Falls prevention discussed Falls evaluation completed Falls evaluation completed Falls evaluation completed Falls evaluation completed    MEDICARE RISK AT HOME:  Medicare Risk at Home Any stairs in or around the home?: No If so, are there any without handrails?: No Home free of loose throw rugs in walkways, pet beds, electrical cords, etc?: Yes Adequate lighting in your home to reduce risk of falls?: Yes Life alert?: No Use of a cane, walker or w/c?: Yes (CANE ALL THE TIME) Grab bars in the bathroom?: No Shower chair or bench in shower?: No Elevated toilet seat or a handicapped toilet?: Yes  TIMED UP AND GO:  Was the test performed?  Yes  Length of time to  ambulate 10 feet: 6 sec Gait slow and steady with assistive device  Cognitive Function: 6CIT completed        12/27/2023   11:39 AM 12/20/2022    2:12 PM 12/17/2021    9:01 AM 11/22/2020   11:02 AM 09/06/2017   11:52 AM  6CIT Screen  What Year? 4 points 0 points 0 points 0 points 0 points  What month? 0  points 0 points 0 points 0 points 0 points  What time? 0 points 0 points 0 points 0 points 3 points  Count back from 20 0 points 2 points 0 points 0 points 0 points  Months in reverse 0 points 4 points 0 points 2 points 4 points  Repeat phrase 0 points 2 points 4 points 0 points 2 points  Total Score 4 points 8 points 4 points 2 points 9 points    Immunizations Immunization History  Administered Date(s) Administered   INFLUENZA, HIGH DOSE SEASONAL PF 12/05/2017   Influenza,inj,Quad PF,6+ Mos 11/26/2014   Influenza,inj,quad, With Preservative 12/05/2017   Influenza-Unspecified 12/06/2015, 12/07/2019, 12/05/2020   PPD Test 02/22/2008   Pneumococcal Conjugate-13 09/06/2017    Screening Tests Health Maintenance  Topic Date Due   Diabetic kidney evaluation - Urine ACR  Never done   Zoster Vaccines- Shingrix (1 of 2) Never done   Pneumococcal Vaccine: 50+ Years (2 of 2 - PPSV23, PCV20, or PCV21) 11/01/2017   OPHTHALMOLOGY EXAM  04/06/2023   Influenza Vaccine  06/04/2024 (Originally 10/06/2023)   HEMOGLOBIN A1C  04/19/2024   FOOT EXAM  07/16/2024   Diabetic kidney evaluation - eGFR measurement  10/17/2024   Medicare Annual Wellness (AWV)  12/26/2024   Colonoscopy  01/01/2031   Hepatitis C Screening  Completed   Meningococcal B Vaccine  Aged Out   DTaP/Tdap/Td  Discontinued   COVID-19 Vaccine  Discontinued    Health Maintenance Items Addressed: DECLINES ALL SHOTS; AGED OUT OF COLONOSCOPY  Additional Screening:  Vision Screening: Recommended annual ophthalmology exams for early detection of glaucoma and other disorders of the eye. Is the patient up to date with their annual eye exam?  Yes  Who is the provider or what is the name of the office in which the patient attends annual eye exams? DR.SHADE  Dental Screening: Recommended annual dental exams for proper oral hygiene  Community Resource Referral / Chronic Care Management: CRR required this visit?  No   CCM required this  visit?  No   Plan:    I have personally reviewed and noted the following in the patient's chart:   Medical and social history Use of alcohol, tobacco or illicit drugs  Current medications and supplements including opioid prescriptions. Patient is not currently taking opioid prescriptions. Functional ability and status Nutritional status Physical activity Advanced directives List of other physicians Hospitalizations, surgeries, and ER visits in previous 12 months Vitals Screenings to include cognitive, depression, and falls Referrals and appointments  In addition, I have reviewed and discussed with patient certain preventive protocols, quality metrics, and best practice recommendations. A written personalized care plan for preventive services as well as general preventive health recommendations were provided to patient.   Jhonnie GORMAN Das, LPN   [id number]   After Visit Summary: (In Person-Declined) Patient declined AVS at this time.  Notes: Nothing significant to report at this time.

## 2023-12-27 NOTE — Telephone Encounter (Signed)
 Copied from CRM [id number]. Topic: General - Other >> Dec 27, 2023  9:51 AM Travis F wrote: Reason for CRM: Carly with [employer] is calling in because she wanted to patient's provider aware of a few things. She says patient has had some suicidal thoughts in the last two weeks but no active plan. She says she did a depression screening and patient scored an 45, but his [place] screening was low, so she says patient doesn't have an active plan, just thoughts. She says she is going to get him connected with mental health services to help him with that. She also says that patient is out of all his medications and would like those to be called into [facility] in [place]. Did not list specific medications. She also said patient has lost 20 pounds due to difficulty swallowing, which per patient the office is aware of. If there are any questions or concerns, Connell can be reached at [phone]

## 2023-12-27 NOTE — Patient Instructions (Addendum)
 Daniel Frye,  Thank you for taking the time for your Medicare Wellness Visit. I appreciate your continued commitment to your health goals. Please review the care plan we discussed, and feel free to reach out if I can assist you further.  Medicare recommends these wellness visits once per year to help you and your care team stay ahead of potential health issues. These visits are designed to focus on prevention, allowing your provider to concentrate on managing your acute and chronic conditions during your regular appointments.  Please note that Annual Wellness Visits do not include a physical exam. Some assessments may be limited, especially if the visit was conducted virtually. If needed, we may recommend a separate in-person follow-up with your provider.  Ongoing Care Seeing your primary care provider every 3 to 6 months helps us  monitor your health and provide consistent, personalized care.   Referrals If a referral was made during today's visit and you haven't received any updates within two weeks, please contact the referred provider directly to check on the status.  Recommended Screenings:  Health Maintenance  Topic Date Due   Yearly kidney health urinalysis for diabetes  Never done   Zoster (Shingles) Vaccine (1 of 2) Never done   Pneumococcal Vaccine for age over 31 (2 of 2 - PPSV23, PCV20, or PCV21) 11/01/2017   Eye exam for diabetics  04/06/2023   Flu Shot  06/04/2024*   Hemoglobin A1C  04/19/2024   Complete foot exam   07/16/2024   Yearly kidney function blood test for diabetes  10/17/2024   Medicare Annual Wellness Visit  12/26/2024   Colon Cancer Screening  01/01/2031   Hepatitis C Screening  Completed   Meningitis B Vaccine  Aged Out   DTaP/Tdap/Td vaccine  Discontinued   COVID-19 Vaccine  Discontinued  *Topic was postponed. The date shown is not the original due date.     Advance Care Planning is important because it: Ensures you receive medical care that aligns with  your values, goals, and preferences. Provides guidance to your family and loved ones, reducing the emotional burden of decision-making during critical moments.  Vision: Annual vision screenings are recommended for early detection of glaucoma, cataracts, and diabetic retinopathy. These exams can also reveal signs of chronic conditions such as diabetes and high blood pressure.  Dental: Annual dental screenings help detect early signs of oral cancer, gum disease, and other conditions linked to overall health, including heart disease and diabetes.  Please see the attached documents for additional preventive care recommendations.   NEXT AWV 01/08/25 @ 11:30 AM IN PERSON

## 2023-12-27 NOTE — Telephone Encounter (Signed)
 Refills called.  Pt was started on Remeron  for depression. He is also following GI for difficulty swallowing. Recommend seeing GI if symptoms are worsening.

## 2023-12-28 ENCOUNTER — Other Ambulatory Visit: Payer: Self-pay | Admitting: Family Medicine

## 2023-12-28 ENCOUNTER — Ambulatory Visit

## 2023-12-28 DIAGNOSIS — F17200 Nicotine dependence, unspecified, uncomplicated: Secondary | ICD-10-CM

## 2023-12-28 DIAGNOSIS — R634 Abnormal weight loss: Secondary | ICD-10-CM

## 2023-12-28 DIAGNOSIS — R1310 Dysphagia, unspecified: Secondary | ICD-10-CM

## 2023-12-28 DIAGNOSIS — R630 Anorexia: Secondary | ICD-10-CM

## 2023-12-28 DIAGNOSIS — F1010 Alcohol abuse, uncomplicated: Secondary | ICD-10-CM

## 2023-12-29 NOTE — Telephone Encounter (Signed)
Please review pharmacy note

## 2023-12-29 NOTE — Telephone Encounter (Signed)
 Requested medication (s) are due for refill today: No  Requested medication (s) are on the active medication list: Yes  Last refill:  12/27/23  Future visit scheduled: No  Notes to clinic:  See pharmacy request.    Requested Prescriptions  Pending Prescriptions Disp Refills   albuterol  (VENTOLIN  HFA) 108 (90 Base) MCG/ACT inhaler [Pharmacy Med Name: ALBUTEROL  HFA (9GM) AER] 18 g 6    Sig: INHALE 2 PUFFS INTO LUNGS EVERY 6 HOURS AS NEEDED. INHALE 2 PUFFS BY MOUTH EVERY 4 HOURS AS NEEDED FOR WHEEZING OR SHORTNESS OF BREATH     Pulmonology:  Beta Agonists 2 Passed - 12/29/2023 10:29 AM      Passed - Last BP in normal range    BP Readings from Last 1 Encounters:  12/27/23 138/80         Passed - Last Heart Rate in normal range    Pulse Readings from Last 1 Encounters:  12/12/23 (!) 110         Passed - Valid encounter within last 12 months    Recent Outpatient Visits           2 weeks ago Dermatitis   [facility] at [facility] Kotturi, Vinay K, MD   1 month ago Dysphagia, unspecified type   [facility] at [facility], Vinay K, MD   2 months ago Loss of appetite   [facility] at [facility] Kotturi, Vinay K, MD   5 months ago Primary hypertension   [place] Primary Care & Sports Medicine at [facility] JA[facility], MD   5 months ago Primary hypertension   [place] Primary Care & Sports Medicine at [facility] JAYSON, MD

## 2024-01-01 ENCOUNTER — Ambulatory Visit: Payer: Self-pay | Admitting: Family Medicine

## 2024-01-01 ENCOUNTER — Ambulatory Visit
Admission: RE | Admit: 2024-01-01 | Discharge: 2024-01-01 | Disposition: A | Discharge status: Home / Self Care | Source: Ambulatory Visit | Attending: Family Medicine | Admitting: Family Medicine

## 2024-01-01 ENCOUNTER — Other Ambulatory Visit: Payer: Self-pay | Admitting: Family Medicine

## 2024-01-01 DIAGNOSIS — F1010 Alcohol abuse, uncomplicated: Secondary | ICD-10-CM | POA: Diagnosis not present

## 2024-01-01 DIAGNOSIS — I311 Chronic constrictive pericarditis: Secondary | ICD-10-CM | POA: Insufficient documentation

## 2024-01-01 DIAGNOSIS — I700 Atherosclerosis of aorta: Secondary | ICD-10-CM | POA: Diagnosis not present

## 2024-01-01 DIAGNOSIS — J438 Other emphysema: Secondary | ICD-10-CM | POA: Insufficient documentation

## 2024-01-01 DIAGNOSIS — F17200 Nicotine dependence, unspecified, uncomplicated: Secondary | ICD-10-CM | POA: Diagnosis not present

## 2024-01-01 DIAGNOSIS — R634 Abnormal weight loss: Secondary | ICD-10-CM | POA: Diagnosis present

## 2024-01-01 DIAGNOSIS — L309 Dermatitis, unspecified: Secondary | ICD-10-CM

## 2024-01-01 DIAGNOSIS — R630 Anorexia: Secondary | ICD-10-CM | POA: Diagnosis present

## 2024-01-01 DIAGNOSIS — I2510 Atherosclerotic heart disease of native coronary artery without angina pectoris: Secondary | ICD-10-CM | POA: Insufficient documentation

## 2024-01-01 DIAGNOSIS — K760 Fatty (change of) liver, not elsewhere classified: Secondary | ICD-10-CM | POA: Diagnosis not present

## 2024-01-01 DIAGNOSIS — R1310 Dysphagia, unspecified: Secondary | ICD-10-CM | POA: Insufficient documentation

## 2024-01-01 HISTORY — DX: Systemic involvement of connective tissue, unspecified: M35.9

## 2024-01-01 LAB — POCT I-STAT CREATININE: Creatinine, Ser: 1.8 mg/dL — ABNORMAL HIGH (ref 0.61–1.24)

## 2024-01-01 MED ORDER — AMMONIUM LACTATE 12 % EX CREA: 1.0000 | TOPICAL_CREAM | CUTANEOUS | 0 refills | Status: DC | PRN

## 2024-01-01 MED ORDER — IOHEXOL 300 MG/ML  SOLN
75.0000 mL | Freq: Once | INTRAMUSCULAR | Status: AC | PRN
Start: 2024-01-01 — End: 2024-01-01
  Administered 2024-01-01: 60 mL via INTRAVENOUS

## 2024-01-02 ENCOUNTER — Telehealth: Payer: Self-pay | Admitting: Family Medicine

## 2024-01-02 ENCOUNTER — Ambulatory Visit: Payer: Self-pay

## 2024-01-02 NOTE — Telephone Encounter (Signed)
 FYI Only or Action Required?: FYI only for provider.  Patient was last seen in primary care on 12/12/2023 by Kotturi, Vinay K, MD.  Called Nurse Triage reporting Pruritis.  Symptoms began several weeks ago.  Interventions attempted: Prescription medications: prednisone .  Symptoms are: gradually worsening.  Triage Disposition: See Physician Within 24 Hours  Patient/caregiver understands and will follow disposition?: YES    Message from [place] S sent at 01/02/2024  2:10 PM EDT  Reason for Triage: itching breaking out in bumps all over   Reason for Disposition  [1] MODERATE-SEVERE widespread itching (i.e., interferes with sleep, normal activities or school) AND [2] not improved after 24 hours of itching Care Advice  Answer Assessment - Initial Assessment Questions Additional info: 1) Patient was evaluated in office on 12/12/23 by pcp for dermatitis, he has completed prednisone  prescription with some relief but now itching intense again and more bumps on his body. Chart reviewed: provided patient with phone number for dermatology clinic which he will call today. He is requesting for pcp appointment to reassess and obtain additional medication until he can be seen by dermatology. Scheduled appointment with pcp on requested date 01/03/24.  2) Patient shares he dealt with similar intense generalized widespread itching several years ago that required injections for treatment. He has been referred to dermatology and rheumatology.    1. DESCRIPTION: Describe the itching you are having.     All over body  2. SEVERITY: How bad is it?      Severe  3. SCRATCHING: Are there any scratch marks? Bleeding?     yes 4. ONSET: When did this begin? (e.g., minutes, hours, days ago)      Several weeks ago 5. CAUSE: What do you think is causing the itching? (ask about swimming pools, pollen, animals, soaps, etc.)     Unsure  6. OTHER SYMPTOMS: Do you have any other symptoms? (e.g.,  fever, rash)     Bumps on skin  Protocols used: Itching - [facility]

## 2024-01-02 NOTE — Telephone Encounter (Unsigned)
 Copied from CRM [id number]. Topic: Clinical - Medication Refill >> Jan 02, 2024  2:07 PM Kendralyn S wrote: Medication: Fluticasone -Umeclidin-Vilant (TRELEGY ELLIPTA ) 100-62.5-25 MCG/ACT  Has the patient contacted their pharmacy? Yes (Agent: If no, request that the patient contact the pharmacy for the refill. If patient does not wish to contact the pharmacy document the reason why and proceed with request.) (Agent: If yes, when and what did the pharmacy advise?)  This is the patient's preferred pharmacy:  [facility] [address], [place] - [address] [place] [address] Phone: [phone] Fax: [phone]   Is this the correct pharmacy for this prescription? Yes If no, delete pharmacy and type the correct one.   Has the prescription been filled recently? No  Is the patient out of the medication? Yes  Has the patient been seen for an appointment in the last year OR does the patient have an upcoming appointment? Yes  Can we respond through MyChart? No  Agent: Please be advised that Rx refills may take up to 3 business days. We ask that you follow-up with your pharmacy.

## 2024-01-03 ENCOUNTER — Ambulatory Visit: Admitting: Family Medicine

## 2024-02-13 ENCOUNTER — Telehealth: Payer: Self-pay

## 2024-02-13 NOTE — Transitions of Care (Post Inpatient/ED Visit) (Unsigned)
   02/13/2024  Name: Dudley Mages MRN: [id number] DOB: 1950-04-25  Today's TOC FU Call Status: Today's TOC FU Call Status:: Unsuccessful Call (1st Attempt) Unsuccessful Call (1st Attempt) Date: 02/13/24  Attempted to reach the patient regarding the most recent Inpatient/ED visit.  Follow Up Plan: Additional outreach attempts will be made to reach the patient to complete the Transitions of Care (Post Inpatient/ED visit) call.   Angelyse Heslin Camacho Ocampo, [facility]  [facility] Primary Care & Sports Medicine at [facility] [address] [place], [place] [address] Office: [phone] Fax: [phone]

## 2024-02-14 NOTE — Transitions of Care (Post Inpatient/ED Visit) (Deleted)
° °  02/14/2024  Name: Daniel Frye MRN: [id number] DOB: 1950-05-27  Today's TOC FU Call Status: Today's TOC FU Call Status:: Successful TOC FU Call Completed Unsuccessful Call (1st Attempt) Date: 02/13/24 [facility] FU Call Complete Date: 02/14/24  Patient's Name and Date of Birth confirmed. Name, DOB  Transition Care Management Follow-up Telephone Call Date of Discharge: 02/10/24 Discharge Facility: Other ([facility] Facility) Name of Other ([facility]) [facility]charge Facility: [facility] Type of Discharge: Emergency Department Reason for ED Visit: Other: How have you been since you were released from the hospital?: Same Any questions or concerns?: No  Items Reviewed: Did you receive and understand the discharge instructions provided?: No Medications obtained,verified, and reconciled?: Yes (Medications Reviewed) Any new allergies since your discharge?: No Dietary orders reviewed?: NA Do you have support at home?: No  Medications Reviewed Today: Medications Reviewed Today   Medications were not reviewed in this encounter     Home Care and Equipment/Supplies: Were Home Health Services Ordered?: NA Any new equipment or medical supplies ordered?: NA  Functional Questionnaire: Do you need assistance with bathing/showering or dressing?: No Do you need assistance with meal preparation?: No Do you need assistance with eating?: No Do you have difficulty maintaining continence: No Do you need assistance with getting out of bed/getting out of a chair/moving?: No Do you have difficulty managing or taking your medications?: No  Follow up appointments reviewed: PCP Follow-up appointment confirmed?: No (patient declined, has an appointment with Dermatology) MD Provider Line [phone] Given: No Specialist Hospital Follow-up appointment confirmed?: Yes Date of Specialist follow-up appointment?: 02/20/24 Follow-Up Specialty Provider:: [facility] dermatology Do you need transportation to  your follow-up appointment?: No Do you understand care options if your condition(s) worsen?: Yes-patient verbalized understanding    Aiyana Stegmann Kristie Harsh, CMA  [facility] Primary Care & Sports Medicine at [facility] [address] [place], [place] [address] Office: [phone] Fax: [phone]

## 2024-02-14 NOTE — Transitions of Care (Post Inpatient/ED Visit) (Signed)
 02/14/2024  Name: Daniel Frye MRN: [id number] DOB: [birth year]  Today's TOC FU Call Status: Today's TOC FU Call Status:: Successful TOC FU Call Completed Unsuccessful Call (1st Attempt) Date: 02/13/24 [facility] FU Call Complete Date: 02/14/24  Patient's Name and Date of Birth confirmed. Name, DOB  Transition Care Management Follow-up Telephone Call Date of Discharge: 02/10/24 Discharge Facility: Other [facility]) Name of Other ([facility]) [facility] Facility[facility] Type of Discharge: Emergency Department Reason for ED Visit: Other: (ichthyosis vulgaris) How have you been since you were released from the hospital?: Same Any questions or concerns?: No  Items Reviewed: Did you receive and understand the discharge instructions provided?: No Medications obtained,verified, and reconciled?: Yes (Medications Reviewed) Any new allergies since your discharge?: No Dietary orders reviewed?: NA Do you have support at home?: No  Medications Reviewed Today: Medications Reviewed Today     Reviewed by Camacho Ocampo, Harli Engelken M, CMA (Certified Medical Assistant) on 02/14/24 at 1622  Med List Status: <None>   Medication Order Taking? Sig Documenting Provider Last Dose Status Informant  albuterol  (VENTOLIN  HFA) 108 (90 Base) MCG/ACT inhaler [id number] Yes INHALE 2 PUFFS INTO LUNGS EVERY 6 HOURS AS NEEDED. INHALE 2 PUFFS BY MOUTH EVERY 4 HOURS AS NEEDED FOR WHEEZING OR SHORTNESS OF BREATH Kotturi, Vinay K, MD  Active   ammonium lactate  (AMLACTIN) 12 % cream [id number] Yes Apply 1 Application topically as needed for dry skin. Kotturi, Vinay K, MD  Active   chlorthalidone  (HYGROTON ) 25 MG tablet [id number] Yes Take 1 tablet (25 mg total) by mouth daily. Kotturi, Vinay K, MD  Active   fluticasone  (FLONASE ) 50 MCG/ACT nasal spray [id number] Yes Place 2 sprays into both nostrils daily. Kotturi, Vinay K, MD  Active   Fluticasone -Umeclidin-Vilant (TRELEGY ELLIPTA ) 100-62.5-25 MCG/ACT AEPB  [id number] Yes Inhale 1 puff into the lungs daily. [provider]  Active   Fluticasone -Umeclidin-Vilant (TRELEGY ELLIPTA ) 100-62.5-25 MCG/ACT AEPB [id number] Yes Inhale 1 puff into the lungs daily. Kotturi, Vinay K, MD  Active   furosemide  (LASIX ) 20 MG tablet [id number] Yes Take 1 tablet (20 mg total) by mouth daily. Kotturi, Vinay K, MD  Active   mirtazapine  (REMERON  SOL-TAB) 30 MG disintegrating tablet [id number] Yes Take 1 tablet (30 mg total) by mouth at bedtime. Kotturi, Vinay K, MD  Active   omeprazole  (PRILOSEC) 40 MG capsule [id number] Yes Take 1 capsule (40 mg total) by mouth daily before breakfast. Kotturi, Vinay K, MD  Active   Med List Note (Ashe, [name], CPhT 10/04/18 2218): Patient is a resident of [facility] and Equipment/Supplies: Were Home Health Services Ordered?: NA Any new equipment or medical supplies ordered?: NA  Functional Questionnaire: Do you need assistance with bathing/showering or dressing?: No Do you need assistance with meal preparation?: No Do you need assistance with eating?: No Do you have difficulty maintaining continence: No Do you need assistance with getting out of bed/getting out of a chair/moving?: No Do you have difficulty managing or taking your medications?: No  Follow up appointments reviewed: PCP Follow-up appointment confirmed?: No (patient declined, has an appointment with Dermatology) MD Provider Line [phone] Given: No Specialist Hospital Follow-up appointment confirmed?: Yes Date of Specialist follow-up appointment?: 02/20/24 Follow-Up Specialty Provider:: [facility] dermatology Do you need transportation to your follow-up appointment?: No Do you understand care options if your condition(s) worsen?: Yes-patient verbalized understanding    Haaris Metallo Camacho Ocampo, CMA  [facility] Primary Care &  Sports Medicine at [facility] [address] [place], [place] [address] Office:  [phone] Fax: [phone]

## 2024-03-14 ENCOUNTER — Other Ambulatory Visit: Payer: Self-pay | Admitting: Family Medicine

## 2024-03-14 DIAGNOSIS — I10 Essential (primary) hypertension: Secondary | ICD-10-CM

## 2024-03-14 NOTE — Telephone Encounter (Signed)
 Copied from CRM [id number]. Topic: Clinical - Medication Refill >> Mar 14, 2024  2:52 PM Suzen RAMAN wrote: Medication: chlorthalidone  (HYGROTON ) 25 MG tablet  Has the patient contacted their pharmacy? Yes  This is the patient's preferred pharmacy:  [facility] [address] [place] - [address] [place] [address] Phone: [phone] Fax: [phone]  Is this the correct pharmacy for this prescription? Yes If no, delete pharmacy and type the correct one.   Has the prescription been filled recently? No  Is the patient out of the medication? Yes  Has the patient been seen for an appointment in the last year OR does the patient have an upcoming appointment? Yes  Can we respond through MyChart? Yes  Agent: Please be advised that Rx refills may take up to 3 business days. We ask that you follow-up with your pharmacy.

## 2024-03-14 NOTE — Telephone Encounter (Signed)
 Copied from CRM [id number]. Topic: Clinical - Medication Refill >> Mar 14, 2024  2:52 PM Suzen RAMAN wrote: Medication: chlorthalidone  (HYGROTON ) 25 MG tablet  Has the patient contacted their pharmacy? Yes  This is the patient's preferred pharmacy:  [facility] [address], [place] - [address] [place] [address] Phone: [phone] Fax: [phone]  Is this the correct pharmacy for this prescription? Yes If no, delete pharmacy and type the correct one.   Has the prescription been filled recently? No  Is the patient out of the medication? Yes  Has the patient been seen for an appointment in the last year OR does the patient have an upcoming appointment? Yes  Can we respond through MyChart? Yes  Agent: Please be advised that Rx refills may take up to 3 business days. We ask that you follow-up with your pharmacy. >> Mar 14, 2024  2:57 PM Suzen RAMAN wrote: Patient also needs a refill on  furosemide  (LASIX ) 20 MG tablet

## 2024-03-15 MED ORDER — FUROSEMIDE 20 MG PO TABS: 20.0000 mg | ORAL_TABLET | Freq: Every day | ORAL | 0 refills | Status: AC

## 2024-03-15 MED ORDER — CHLORTHALIDONE 25 MG PO TABS: 25.0000 mg | ORAL_TABLET | Freq: Every day | ORAL | 0 refills | Status: AC

## 2024-03-15 NOTE — Telephone Encounter (Signed)
 Requested Prescriptions  Pending Prescriptions Disp Refills   chlorthalidone  (HYGROTON ) 25 MG tablet 90 tablet 0    Sig: Take 1 tablet (25 mg total) by mouth daily.     Cardiovascular: Diuretics - Thiazide Failed - 03/15/2024 12:20 PM      Failed - Cr in normal range and within 180 days    Creatinine, Ser  Date Value Ref Range Status  01/01/2024 1.80 (H) 0.61 - 1.24 mg/dL Final         Failed - Na in normal range and within 180 days    Sodium  Date Value Ref Range Status  10/18/2023 131 (L) 134 - 144 mmol/L Final         Passed - K in normal range and within 180 days    Potassium  Date Value Ref Range Status  10/18/2023 4.5 3.5 - 5.2 mmol/L Final         Passed - Last BP in normal range    BP Readings from Last 1 Encounters:  12/27/23 138/80         Passed - Valid encounter within last 6 months    Recent Outpatient Visits           3 months ago Dermatitis   [place] Primary Care & Sports Medicine at [facility], Vinay K, MD   4 months ago Dysphagia, unspecified type   [facility] Primary Care & Sports Medicine at [facility], Vinay K, MD   4 months ago Loss of appetite   [facility] Primary Care & Sports Medicine at [facility] Kotturi, Vinay K, MD   7 months ago Primary hypertension   [place] Primary Care & Sports Medicine at [facility], MD   8 months ago Primary hypertension   [place] Primary Care & Sports Medicine at [facility], MD               furosemide  (LASIX ) 20 MG tablet 90 tablet 0    Sig: Take 1 tablet (20 mg total) by mouth daily.     Cardiovascular:  Diuretics - Loop Failed - 03/15/2024 12:20 PM      Failed - Na in normal range and within 180 days    Sodium  Date Value Ref Range Status  10/18/2023 131 (L) 134 - 144 mmol/L Final         Failed - Cr in normal range and within 180 days    Creatinine, Ser  Date Value Ref Range Status  01/01/2024 1.80 (H) 0.61 -  1.24 mg/dL Final         Failed - Cl in normal range and within 180 days    Chloride  Date Value Ref Range Status  10/18/2023 92 (L) 96 - 106 mmol/L Final         Failed - Mg Level in normal range and within 180 days    Magnesium   Date Value Ref Range Status  04/16/2016 1.8 1.7 - 2.4 mg/dL Final         Passed - K in normal range and within 180 days    Potassium  Date Value Ref Range Status  10/18/2023 4.5 3.5 - 5.2 mmol/L Final         Passed - Ca in normal range and within 180 days    Calcium  Date Value Ref Range Status  10/18/2023 9.6 8.6 - 10.2 mg/dL Final  Passed - Last BP in normal range    BP Readings from Last 1 Encounters:  12/27/23 138/80         Passed - Valid encounter within last 6 months    Recent Outpatient Visits           3 months ago Dermatitis   [facility] Primary Care & Sports Medicine at [facility], Vinay K, MD   4 months ago Dysphagia, unspecified type   [facility] Primary Care & Sports Medicine at [facility], Vinay K, MD   4 months ago Loss of appetite   [facility] Primary Care & Sports Medicine at [facility], Vinay K, MD   7 months ago Primary hypertension   [place] Primary Care & Sports Medicine at [facility], MD   8 months ago Primary hypertension   [facility] Primary Care & Sports Medicine at [facility], MD

## 2024-03-15 NOTE — Telephone Encounter (Signed)
 Duplicate request, refilled 03/16/23.  Requested Prescriptions  Pending Prescriptions Disp Refills   chlorthalidone  (HYGROTON ) 25 MG tablet 30 tablet 0    Sig: Take 1 tablet (25 mg total) by mouth daily.     Cardiovascular: Diuretics - Thiazide Failed - 03/15/2024  1:14 PM      Failed - Cr in normal range and within 180 days    Creatinine, Ser  Date Value Ref Range Status  01/01/2024 1.80 (H) 0.61 - 1.24 mg/dL Final         Failed - Na in normal range and within 180 days    Sodium  Date Value Ref Range Status  10/18/2023 131 (L) 134 - 144 mmol/L Final         Passed - K in normal range and within 180 days    Potassium  Date Value Ref Range Status  10/18/2023 4.5 3.5 - 5.2 mmol/L Final         Passed - Last BP in normal range    BP Readings from Last 1 Encounters:  12/27/23 138/80         Passed - Valid encounter within last 6 months    Recent Outpatient Visits           3 months ago Dermatitis   [place] [facility] Care & Sports Medicine at [facility] Kotturi, Vinay K, MD   4 months ago Dysphagia, unspecified type   [facility] Primary Care & Sports Medicine at [facility], Vinay K, MD   4 months ago Loss of appetite   [facility] Primary Care & Sports Medicine at [facility] Kotturi, Vinay K, MD   7 months ago Primary hypertension   [place] Primary Care & Sports Medicine at [facility], MD   8 months ago Primary hypertension   [place] Primary Care & Sports Medicine at [facility], MD

## 2024-03-22 ENCOUNTER — Other Ambulatory Visit: Payer: Self-pay | Admitting: Family Medicine

## 2024-03-22 DIAGNOSIS — L309 Dermatitis, unspecified: Secondary | ICD-10-CM

## 2024-03-22 NOTE — Telephone Encounter (Signed)
 Requested Prescriptions  Pending Prescriptions Disp Refills   ammonium lactate  (AMLACTIN) 12 % cream [Pharmacy Med Name: Ammonium Lactate  12 % External Cream] 280 g 0    Sig: APPLY  1 APPLICATION TOPICALLY AS NEEDED FOR  DRY  SKIN.     Dermatology:  Other Passed - 03/22/2024  2:19 PM      Passed - Valid encounter within last 12 months    Recent Outpatient Visits           3 months ago Dermatitis   [place] Primary Care & Sports Medicine at [facility], Vinay K, MD   4 months ago Dysphagia, unspecified type   [facility] Primary Care & Sports Medicine at [facility], Vinay K, MD   5 months ago Loss of appetite   [facility] Primary Care & Sports Medicine at [facility], Vinay K, MD   8 months ago Primary hypertension   [place] Primary Care & Sports Medicine at [facility], MD   8 months ago Primary hypertension   [facility] Primary Care & Sports Medicine at [facility], MD

## 2024-04-02 ENCOUNTER — Other Ambulatory Visit: Payer: Self-pay | Admitting: Family Medicine

## 2024-04-02 DIAGNOSIS — L309 Dermatitis, unspecified: Secondary | ICD-10-CM

## 2024-04-02 NOTE — Telephone Encounter (Unsigned)
 Copied from CRM #[id number]. Topic: Clinical - Medication Refill >> Apr 02, 2024 12:39 PM Zebedee SAUNDERS wrote: Medication: ammonium lactate  (AMLACTIN) 12 % cream  Has the patient contacted their pharmacy? Yes (Agent: If no, request that the patient contact the pharmacy for the refill. If patient does not wish to contact the pharmacy document the reason why and proceed with request.) (Agent: If yes, when and what did the pharmacy advise?)Pharmacy need script  This is the patient's preferred pharmacy:  [facility] [address], [place] - [address] [place] [address] Phone: [phone] Fax: [phone]  Is this the correct pharmacy for this prescription? Yes If no, delete pharmacy and type the correct one.   Has the prescription been filled recently? Yes  Is the patient out of the medication? Yes  Has the patient been seen for an appointment in the last year OR does the patient have an upcoming appointment? Yes  Can we respond through MyChart? No  Agent: Please be advised that Rx refills may take up to 3 business days. We ask that you follow-up with your pharmacy.

## 2024-04-03 MED ORDER — AMMONIUM LACTATE 12 % EX CREA: 1.0000 | TOPICAL_CREAM | CUTANEOUS | 0 refills | Status: AC | PRN

## 2024-04-03 NOTE — Telephone Encounter (Signed)
 Resending d/t pharmacy not receiving ammonium lactate  (AMLACTIN) 12 % cream 280 g 0 03/22/2024 --   Sig - Route: APPLY  1 APPLICATION TOPICALLY AS NEEDED FOR  DRY  SKIN. - Topical   Sent to pharmacy as: ammonium lactate  (AMLACTIN) 12 % cream   E-Prescribing Status: Receipt confirmed by pharmacy (03/22/2024  2:19 PM EST)     Requested Prescriptions  Pending Prescriptions Disp Refills   ammonium lactate  (AMLACTIN) 12 % cream 280 g 0    Sig: Apply 1 Application topically as needed for dry skin.     Dermatology:  Other Passed - 04/03/2024 12:29 PM      Passed - Valid encounter within last 12 months    Recent Outpatient Visits           3 months ago Dermatitis   [facility], Vinay K, MD   4 months ago Dysphagia, unspecified type   [facility] at [facility], Vinay K, MD   5 months ago Loss of appetite   [facility] at [facility], Vinay K, MD   8 months ago Primary hypertension   [place] [facility] Cathryne JAYSON, MD   8 months ago Primary hypertension   [facility] at [facility] Cathryne JAYSON, MD

## 2024-04-04 ENCOUNTER — Ambulatory Visit: Admitting: Family Medicine

## 2024-04-08 ENCOUNTER — Ambulatory Visit: Admitting: Family Medicine

## 2024-04-11 ENCOUNTER — Ambulatory Visit: Admitting: Family Medicine

## 2024-04-17 ENCOUNTER — Ambulatory Visit: Admitting: Family Medicine

## 2025-01-08 ENCOUNTER — Ambulatory Visit
